# Patient Record
Sex: Male | Born: 1965 | Race: Black or African American | Hispanic: No | Marital: Married | Smoking: Former smoker
Health system: Southern US, Community
[De-identification: ages and names within clinical notes are randomized; demographics above are authoritative.]

## PROBLEM LIST (undated history)

## (undated) DIAGNOSIS — T7840XA Allergy, unspecified, initial encounter: Secondary | ICD-10-CM

## (undated) DIAGNOSIS — Z87442 Personal history of urinary calculi: Secondary | ICD-10-CM

## (undated) DIAGNOSIS — I10 Essential (primary) hypertension: Secondary | ICD-10-CM

## (undated) DIAGNOSIS — K219 Gastro-esophageal reflux disease without esophagitis: Secondary | ICD-10-CM

## (undated) DIAGNOSIS — M1990 Unspecified osteoarthritis, unspecified site: Secondary | ICD-10-CM

## (undated) DIAGNOSIS — T79A0XA Compartment syndrome, unspecified, initial encounter: Secondary | ICD-10-CM

## (undated) HISTORY — DX: Unspecified osteoarthritis, unspecified site: M19.90

## (undated) HISTORY — DX: Essential (primary) hypertension: I10

## (undated) HISTORY — DX: Allergy, unspecified, initial encounter: T78.40XA

## (undated) HISTORY — PX: COLONOSCOPY: SHX5424

## (undated) HISTORY — DX: Gastro-esophageal reflux disease without esophagitis: K21.9

## (undated) HISTORY — PX: FASCIOTOMY: SHX132

---

## 2003-01-13 ENCOUNTER — Emergency Department (HOSPITAL_COMMUNITY): Admission: AD | Admit: 2003-01-13 | Discharge: 2003-01-13 | Payer: Self-pay | Admitting: Family Medicine

## 2003-01-20 ENCOUNTER — Emergency Department (HOSPITAL_COMMUNITY): Admission: AD | Admit: 2003-01-20 | Discharge: 2003-01-20 | Payer: Self-pay | Admitting: Family Medicine

## 2003-01-20 ENCOUNTER — Encounter: Payer: Self-pay | Admitting: Family Medicine

## 2003-11-02 ENCOUNTER — Emergency Department (HOSPITAL_COMMUNITY): Admission: EM | Admit: 2003-11-02 | Discharge: 2003-11-02 | Payer: Self-pay

## 2003-12-26 ENCOUNTER — Emergency Department (HOSPITAL_COMMUNITY): Admission: EM | Admit: 2003-12-26 | Discharge: 2003-12-26 | Payer: Self-pay | Admitting: Emergency Medicine

## 2004-01-08 ENCOUNTER — Emergency Department (HOSPITAL_COMMUNITY): Admission: EM | Admit: 2004-01-08 | Discharge: 2004-01-08 | Payer: Self-pay | Admitting: Emergency Medicine

## 2004-08-12 ENCOUNTER — Emergency Department (HOSPITAL_COMMUNITY): Admission: EM | Admit: 2004-08-12 | Discharge: 2004-08-12 | Payer: Self-pay | Admitting: Family Medicine

## 2004-09-28 ENCOUNTER — Emergency Department (HOSPITAL_COMMUNITY): Admission: EM | Admit: 2004-09-28 | Discharge: 2004-09-28 | Payer: Self-pay | Admitting: Emergency Medicine

## 2012-03-28 HISTORY — PX: CARPAL TUNNEL RELEASE: SHX101

## 2013-09-29 ENCOUNTER — Encounter (HOSPITAL_BASED_OUTPATIENT_CLINIC_OR_DEPARTMENT_OTHER): Payer: Self-pay | Admitting: Emergency Medicine

## 2013-09-29 ENCOUNTER — Emergency Department (HOSPITAL_BASED_OUTPATIENT_CLINIC_OR_DEPARTMENT_OTHER)
Admission: EM | Admit: 2013-09-29 | Discharge: 2013-09-29 | Disposition: A | Discharge status: Home / Self Care | Payer: Self-pay | Attending: Emergency Medicine | Admitting: Emergency Medicine

## 2013-09-29 DIAGNOSIS — S60222A Contusion of left hand, initial encounter: Secondary | ICD-10-CM

## 2013-09-29 DIAGNOSIS — S60229A Contusion of unspecified hand, initial encounter: Secondary | ICD-10-CM | POA: Insufficient documentation

## 2013-09-29 DIAGNOSIS — Y9389 Activity, other specified: Secondary | ICD-10-CM | POA: Insufficient documentation

## 2013-09-29 DIAGNOSIS — Y92410 Unspecified street and highway as the place of occurrence of the external cause: Secondary | ICD-10-CM | POA: Insufficient documentation

## 2013-09-29 DIAGNOSIS — F17200 Nicotine dependence, unspecified, uncomplicated: Secondary | ICD-10-CM | POA: Insufficient documentation

## 2013-09-29 HISTORY — DX: Compartment syndrome, unspecified, initial encounter: T79.A0XA
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
## 2015-06-08 ENCOUNTER — Ambulatory Visit: Payer: Self-pay | Admitting: Family

## 2015-06-22 ENCOUNTER — Ambulatory Visit (INDEPENDENT_AMBULATORY_CARE_PROVIDER_SITE_OTHER): Payer: 59 | Admitting: Physician Assistant

## 2015-06-22 VITALS — BP 120/72 | HR 80 | Temp 100.0°F | Resp 18 | Ht 69.0 in | Wt 203.0 lb

## 2015-06-22 DIAGNOSIS — R509 Fever, unspecified: Secondary | ICD-10-CM

## 2015-06-22 DIAGNOSIS — R6889 Other general symptoms and signs: Secondary | ICD-10-CM

## 2015-06-22 LAB — POCT CBC
Granulocyte percent: 69.8 %G (ref 37–80)
HCT, POC: 39.8 % — AB (ref 43.5–53.7)
Hemoglobin: 13.9 g/dL — AB (ref 14.1–18.1)
Lymph, poc: 1.1 (ref 0.6–3.4)
MCH, POC: 28.7 pg (ref 27–31.2)
MCHC: 35 g/dL (ref 31.8–35.4)
MCV: 81.9 fL (ref 80–97)
MID (cbc): 0.3 (ref 0–0.9)
MPV: 7.3 fL (ref 0–99.8)
POC Granulocyte: 3.1 (ref 2–6.9)
POC LYMPH PERCENT: 24 %L (ref 10–50)
POC MID %: 6.2 %M (ref 0–12)
Platelet Count, POC: 172 10*3/uL (ref 142–424)
RBC: 4.85 M/uL (ref 4.69–6.13)
RDW, POC: 14.7 %
WBC: 4.5 10*3/uL — AB (ref 4.6–10.2)

## 2015-06-22 MED ORDER — HYDROCOD POLST-CPM POLST ER 10-8 MG/5ML PO SUER: 5.0000 mL | Freq: Two times a day (BID) | ORAL | Status: DC | PRN

## 2015-06-22 MED ORDER — BENZONATATE 100 MG PO CAPS: 100.0000 mg | ORAL_CAPSULE | Freq: Three times a day (TID) | ORAL | Status: DC | PRN

## 2015-06-22 NOTE — Patient Instructions (Addendum)
Drink plenty of water (64 oz/day) and get plenty of rest. If you have been prescribed a cough syrup, do not drive or operate heavy machinery while using this medication. Tessalon during the day. If your symptoms are not improving in 1 week, return to clinic.     IF you received an x-ray today, you will receive an invoice from [facility]. Please contact [facility] at [phone] with questions or concerns regarding your invoice.   IF you received labwork today, you will receive an invoice from [employer]. Please contact [facility] at [phone] with questions or concerns regarding your invoice.   Our billing staff will not be able to assist you with questions regarding bills from these companies.  You will be contacted with the lab results as soon as they are available. The fastest way to get your results is to activate your My Chart account. Instructions are located on the last page of this paperwork. If you have not heard from [place] regarding the results in 2 weeks, please contact this office.

## 2015-06-22 NOTE — Progress Notes (Signed)
[facility] [address] [place] [address] [phone]  Date:  06/22/2015   Name:  [name]   DOB:  20-Jan-1966   MRN:  [id number]  PCP:  No primary care provider on file.    Chief Complaint: Cough; Nasal Congestion; and Generalized Body Aches   History of Present Illness:  This is a 50 y.o. male with no PMH who is presenting with cough, nasal congestion and body aches x 5-6 days. Hasn't been checking temp at home but temp 100.0 here today. States his whole body aches when he coughs. Cough is dry. Last night thought he could hear some wheezing in his chest. Denies otalgia, sore throat.  Aggravating/alleviating factors: mucinex but doesn't think it's helping. History of asthma: no History of env allergies: does have problems with sinuses sometimes Tobacco use: no Did not get flu shot this season  He is here with wife. Her flu test was positive.  Review of Systems:  Review of Systems See HPI  There are no active problems to display for this patient.   Prior to Admission medications   Not on File    Allergies  Allergen Reactions  . Doxycycline     Past Surgical History  Procedure Laterality Date  . Carpal tunnel release      Social History  Substance Use Topics  . Smoking status: Former Research scientist (life sciences)  . Smokeless tobacco: None  . Alcohol Use: 0.0 oz/week    0 Standard drinks or equivalent per week    History reviewed. No pertinent family history.  Medication list has been reviewed and updated.  Physical Examination:  Physical Exam  Constitutional: He is oriented to person, place, and time. He appears well-developed and well-nourished. No distress.  HENT:  Head: Normocephalic and atraumatic.  Right Ear: Hearing, tympanic membrane, external ear and ear canal normal.  Left Ear: Hearing, tympanic membrane, external ear and ear canal normal.  Nose: Nose normal.  Mouth/Throat: Uvula is midline and mucous membranes are normal.  Posterior oropharyngeal erythema present. No oropharyngeal exudate or posterior oropharyngeal edema.  Eyes: Conjunctivae and lids are normal. Right eye exhibits no discharge. Left eye exhibits no discharge. No scleral icterus.  Cardiovascular: Normal rate, regular rhythm, normal heart sounds and normal pulses.   No murmur heard. Pulmonary/Chest: Effort normal and breath sounds normal. No respiratory distress. He has no wheezes. He has no rhonchi. He has no rales.  Musculoskeletal: Normal range of motion.  Lymphadenopathy:       Head (right side): No submental, no submandibular and no tonsillar adenopathy present.       Head (left side): No submental, no submandibular and no tonsillar adenopathy present.    He has no cervical adenopathy.  Neurological: He is alert and oriented to person, place, and time.  Skin: Skin is warm, dry and intact. No lesion and no rash noted.  Psychiatric: He has a normal mood and affect. His speech is normal and behavior is normal. Thought content normal.   BP 120/72 mmHg  Pulse 80  Temp(Src) 100 F (37.8 C) (Oral)  Resp 18  Ht 5\' 9"  (1.753 m)  Wt 203 lb (92.08 kg)  BMI 29.96 kg/m2  SpO2 98%  Results for orders placed or performed in visit on 06/22/15  POCT CBC  Result Value Ref Range   WBC 4.5 (A) 4.6 - 10.2 K/uL   Lymph, poc 1.1 0.6 - 3.4   POC LYMPH PERCENT 24.0 10 - 50 %L  MID (cbc) 0.3 0 - 0.9   POC MID % 6.2 0 - 12 %M   POC Granulocyte 3.1 2 - 6.9   Granulocyte percent 69.8 37 - 80 %G   RBC 4.85 4.69 - 6.13 M/uL   Hemoglobin 13.9 (A) 14.1 - 18.1 g/dL   HCT, POC 39.8 (A) 43.5 - 53.7 %   MCV 81.9 80 - 97 fL   MCH, POC 28.7 27 - 31.2 pg   MCHC 35.0 31.8 - 35.4 g/dL   RDW, POC 14.7 %   Platelet Count, POC 172 142 - 424 K/uL   MPV 7.3 0 - 99.8 fL   Assessment and Plan:  1. Flu-like symptoms 2. Fever, unspecified Pt likely at end of flu. His wife started with similar symptoms 2 days ago and tested positive for flu. CBC supports viral cause.  Will treat symptomatically with tussionex and tessalon. Return in 5-7 days if symptoms do not improve or at any time if symptoms worsen.  - benzonatate (TESSALON) 100 MG capsule; Take 1-2 capsules (100-200 mg total) by mouth 3 (three) times daily as needed for cough.  Dispense: 40 capsule; Refill: 0 - chlorpheniramine-HYDROcodone (TUSSIONEX PENNKINETIC ER) 10-8 MG/5ML SUER; Take 5 mLs by mouth every 12 (twelve) hours as needed for cough.  Dispense: 100 mL; Refill: 0 - POCT CBC   [name]  06/22/2015

## 2015-09-30 ENCOUNTER — Ambulatory Visit: Payer: Self-pay | Admitting: Family

## 2015-10-06 ENCOUNTER — Ambulatory Visit: Payer: Self-pay | Admitting: Family

## 2015-10-09 ENCOUNTER — Ambulatory Visit: Payer: Self-pay | Admitting: Family

## 2015-11-17 ENCOUNTER — Other Ambulatory Visit (INDEPENDENT_AMBULATORY_CARE_PROVIDER_SITE_OTHER): Payer: 59

## 2015-11-17 ENCOUNTER — Ambulatory Visit (INDEPENDENT_AMBULATORY_CARE_PROVIDER_SITE_OTHER): Payer: 59 | Admitting: Family

## 2015-11-17 ENCOUNTER — Encounter: Payer: Self-pay | Admitting: Family

## 2015-11-17 VITALS — BP 140/94 | HR 73 | Temp 98.3°F | Resp 16 | Ht 69.0 in | Wt 200.1 lb

## 2015-11-17 DIAGNOSIS — Z0000 Encounter for general adult medical examination without abnormal findings: Secondary | ICD-10-CM | POA: Insufficient documentation

## 2015-11-17 DIAGNOSIS — E663 Overweight: Secondary | ICD-10-CM

## 2015-11-17 DIAGNOSIS — Z1211 Encounter for screening for malignant neoplasm of colon: Secondary | ICD-10-CM | POA: Diagnosis not present

## 2015-11-17 DIAGNOSIS — Z23 Encounter for immunization: Secondary | ICD-10-CM | POA: Diagnosis not present

## 2015-11-17 LAB — CBC
HCT: 41.7 % (ref 39.0–52.0)
HEMOGLOBIN: 14.1 g/dL (ref 13.0–17.0)
MCHC: 33.8 g/dL (ref 30.0–36.0)
MCV: 81.8 fl (ref 78.0–100.0)
PLATELETS: 266 10*3/uL (ref 150.0–400.0)
RBC: 5.1 Mil/uL (ref 4.22–5.81)
RDW: 14.7 % (ref 11.5–15.5)
WBC: 4.5 10*3/uL (ref 4.0–10.5)

## 2015-11-17 LAB — COMPREHENSIVE METABOLIC PANEL
ALK PHOS: 64 U/L (ref 39–117)
ALT: 18 U/L (ref 0–53)
AST: 24 U/L (ref 0–37)
Albumin: 4.8 g/dL (ref 3.5–5.2)
BILIRUBIN TOTAL: 0.4 mg/dL (ref 0.2–1.2)
BUN: 11 mg/dL (ref 6–23)
CALCIUM: 9.5 mg/dL (ref 8.4–10.5)
CO2: 29 meq/L (ref 19–32)
CREATININE: 0.9 mg/dL (ref 0.40–1.50)
Chloride: 101 mEq/L (ref 96–112)
GFR: 113.85 mL/min (ref >60.00)
GLUCOSE: 104 mg/dL — AB (ref 70–99)
Potassium: 5.1 mEq/L (ref 3.5–5.1)
Sodium: 138 mEq/L (ref 135–145)
TOTAL PROTEIN: 7.9 g/dL (ref 6.0–8.3)

## 2015-11-17 LAB — LIPID PANEL
CHOL/HDL RATIO: 4
Cholesterol: 270 mg/dL — ABNORMAL HIGH (ref 0–200)
HDL: 76.7 mg/dL (ref >39.00)
LDL Cholesterol: 180 mg/dL — ABNORMAL HIGH (ref 0–99)
NONHDL: 193.11
Triglycerides: 68 mg/dL (ref 0.0–149.0)
VLDL: 13.6 mg/dL (ref 0.0–40.0)

## 2015-11-17 LAB — HEPATITIS C ANTIBODY: HCV AB: NEGATIVE

## 2015-11-17 LAB — PSA: PSA: 2.58 ng/mL (ref 0.10–4.00)

## 2015-11-17 NOTE — Assessment & Plan Note (Signed)
BMI of 29 indicating overweight. Recommend weight loss of 5-10% of current body weight. Recommend increasing physical activity to 30 minutes of moderate level activity daily. Encourage nutritional intake that focuses on nutrient dense foods and is moderate, varied, and balanced and is low in saturated fats and processed/sugary foods.

## 2015-11-17 NOTE — Assessment & Plan Note (Addendum)
1) Anticipatory Guidance: Discussed importance of wearing a seatbelt while driving and not texting while driving; changing batteries in smoke detector at least once annually; wearing suntan lotion when outside; eating a balanced and moderate diet; getting physical activity at least 30 minutes per day.  2) Immunizations / Screenings / Labs:  Tetanus updated today. Declines influenza. All other immunizations are up-to-date per recommendations. Due for a dental screening encouraged to be completed independently. Obtain PSA for prostate cancer screening. Obtain hepatitis C antibody for hepatitis C screening. Due for colonoscopy with referral to gastroenterology placed. All other screenings are up-to-date per recommendations. Obtain CBC, CMET, and lipid profile.  Overall well exam with risk factors for cardiovascular disease including overweight and elevated blood pressure without hypertension. Recommend weight loss of 5-10% of current body weight through nutrition and physical activity. Follow low-sodium diet.Recommend increasing physical activity to 30 minutes of moderate level activity daily. Encourage nutritional intake that focuses on nutrient dense foods and is moderate, varied, and balanced and is low in saturated fats and processed/sugary foods. Continue to monitor blood pressure at home. Continue other healthy lifestyle behaviors and choices. Follow-up prevention exam in 1 year. Follow up office visit pending blood work as necessary.

## 2015-11-17 NOTE — Progress Notes (Signed)
Subjective:    Patient ID: [name], male    DOB: 27-Dec-1965, 50 y.o.   MRN: [id number]  Chief Complaint  Patient presents with  . Establish Care    CPE, fasting    HPI:  [name] is a 50 y.o. male who presents today for an annual wellness visit.   1) Health Maintenance -   Diet - Averages about 2-3 meals per day consisting of regular diet; 1-2 cups of caffeine daily  Exercise - Work; no structured outside of work   2) Publishing rights manager / Immunizations:  Dental -- Due for exam  Vision -- Up to date   Health Maintenance  Topic Date Due  . Hepatitis C Screening  [birth year]  . HIV Screening  08/15/1978  . TETANUS/TDAP  08/15/1982  . COLONOSCOPY  08/14/2013  . INFLUENZA VACCINE  12/10/2016 (Originally 10/27/2015)   Immunization History  Administered Date(s) Administered  . Tdap 11/17/2015    Allergies  Allergen Reactions  . Doxycycline      Outpatient Medications Prior to Visit  Medication Sig Dispense Refill  . benzonatate (TESSALON) 100 MG capsule Take 1-2 capsules (100-200 mg total) by mouth 3 (three) times daily as needed for cough. 40 capsule 0  . chlorpheniramine-HYDROcodone (TUSSIONEX PENNKINETIC ER) 10-8 MG/5ML SUER Take 5 mLs by mouth every 12 (twelve) hours as needed for cough. 100 mL 0   No facility-administered medications prior to visit.      Past Medical History:  Diagnosis Date  . 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 CARPAL TUNNEL RELEASE       Family History  Problem Relation Age of Onset  . Cataracts Mother   . Dementia Mother   . Hypertension Father   . Kidney disease Father   . Glaucoma Maternal Grandmother   . Hypertension Paternal Grandfather      Social History   Social History  . Marital status: Married    Spouse name: N/A  . Number of children: 2  . Years of education: 12   Occupational History  . [employer]    Social  History Main Topics  . Smoking status: Former Research scientist (life sciences)  . Smokeless tobacco: Never Used  . Alcohol use 1.2 oz/week    2 Cans of beer per week  . Drug use: No  . Sexual activity: Not on file   Other Topics Concern  . Not on file   Social History Narrative   Fun: [name]    Review of Systems  Constitutional: Denies fever, chills, fatigue, or significant weight gain/loss. HENT: Head: Denies headache or neck pain Ears: Denies changes in hearing, ringing in ears, earache, drainage Nose: Denies discharge, stuffiness, itching, nosebleed, sinus pain Throat: Denies sore throat, hoarseness, dry mouth, sores, thrush Eyes: Denies loss/changes in vision, pain, redness, blurry/double vision, flashing lights Cardiovascular: Denies chest pain/discomfort, tightness, palpitations, shortness of breath with activity, difficulty lying down, swelling, sudden awakening with shortness of breath Respiratory: Denies shortness of breath, cough, sputum production, wheezing Gastrointestinal: Denies dysphasia, heartburn, change in appetite, nausea, change in bowel habits, rectal bleeding, constipation, diarrhea, yellow skin or eyes Genitourinary: Denies frequency, urgency, burning/pain, blood in urine, incontinence, change in urinary strength. Musculoskeletal: Denies muscle/joint pain, stiffness, back pain, redness or swelling of joints, trauma Skin: Denies rashes, lumps, itching, dryness, color changes, or hair/nail changes Neurological: Denies dizziness, fainting, seizures, weakness, numbness, tingling, tremor Psychiatric - Denies nervousness, stress,  depression or memory loss Endocrine: Denies heat or cold intolerance, sweating, frequent urination, excessive thirst, changes in appetite Hematologic: Denies ease of bruising or bleeding     Objective:    BP (!) 140/94 (BP Location: Left Arm, Patient Position: Sitting, Cuff Size: Large)   Pulse 73   Temp 98.3 F (36.8 C) (Oral)   Resp 16   Ht 5\' 9"  (1.753 m)    Wt 200 lb 1.9 oz (90.8 kg)   SpO2 96%   BMI 29.55 kg/m  Nursing note and vital signs reviewed.  Physical Exam  Constitutional: He is oriented to person, place, and time. He appears well-developed and well-nourished.  HENT:  Head: Normocephalic.  Right Ear: Hearing, tympanic membrane, external ear and ear canal normal.  Left Ear: Hearing, tympanic membrane, external ear and ear canal normal.  Nose: Nose normal.  Mouth/Throat: Uvula is midline, oropharynx is clear and moist and mucous membranes are normal.  Eyes: Conjunctivae and EOM are normal. Pupils are equal, round, and reactive to light.  Neck: Neck supple. No JVD present. No tracheal deviation present. No thyromegaly present.  Cardiovascular: Normal rate, regular rhythm, normal heart sounds and intact distal pulses.   Pulmonary/Chest: Effort normal and breath sounds normal.  Abdominal: Soft. Bowel sounds are normal. He exhibits no distension and no mass. There is no tenderness. There is no rebound and no guarding.  Musculoskeletal: Normal range of motion. He exhibits no edema or tenderness.  Lymphadenopathy:    He has no cervical adenopathy.  Neurological: He is alert and oriented to person, place, and time. He has normal reflexes. No cranial nerve deficit. He exhibits normal muscle tone. Coordination normal.  Skin: Skin is warm and dry.  Psychiatric: He has a normal mood and affect. His behavior is normal. Judgment and thought content normal.       Assessment & Plan:   Problem List Items Addressed This Visit      Other   Routine general medical examination at a health care facility - Primary    1) Anticipatory Guidance: Discussed importance of wearing a seatbelt while driving and not texting while driving; changing batteries in smoke detector at least once annually; wearing suntan lotion when outside; eating a balanced and moderate diet; getting physical activity at least 30 minutes per day.  2) Immunizations / Screenings  / Labs:  Tetanus updated today. Declines influenza. All other immunizations are up-to-date per recommendations. Due for a dental screening encouraged to be completed independently. Obtain PSA for prostate cancer screening. Obtain hepatitis C antibody for hepatitis C screening. Due for colonoscopy with referral to gastroenterology placed. All other screenings are up-to-date per recommendations. Obtain CBC, CMET, and lipid profile.  Overall well exam with risk factors for cardiovascular disease including overweight and elevated blood pressure without hypertension. Recommend weight loss of 5-10% of current body weight through nutrition and physical activity. Follow low-sodium diet.Recommend increasing physical activity to 30 minutes of moderate level activity daily. Encourage nutritional intake that focuses on nutrient dense foods and is moderate, varied, and balanced and is low in saturated fats and processed/sugary foods. Continue to monitor blood pressure at home. Continue other healthy lifestyle behaviors and choices. Follow-up prevention exam in 1 year. Follow up office visit pending blood work as necessary.         Relevant Orders   CBC (Completed)   Comprehensive metabolic panel (Completed)   Lipid panel (Completed)   PSA (Completed)   Hepatitis C antibody   Overweight (BMI 25.0-29.9)  BMI of 29 indicating overweight. Recommend weight loss of 5-10% of current body weight. Recommend increasing physical activity to 30 minutes of moderate level activity daily. Encourage nutritional intake that focuses on nutrient dense foods and is moderate, varied, and balanced and is low in saturated fats and processed/sugary foods.        Other Visit Diagnoses    Colon cancer screening       Relevant Orders   Ambulatory referral to Gastroenterology   Need for Tdap vaccination       Relevant Orders   Tdap vaccine greater than or equal to 7yo IM (Completed)      I have discontinued Mr. Cuadras  benzonatate and chlorpheniramine-HYDROcodone.   Follow-up: Return if symptoms worsen or fail to improve.   Mauricio Po, FNP

## 2015-11-17 NOTE — Patient Instructions (Signed)
Thank you for choosing [facility].  Summary/Instructions:  Your prescription(s) have been submitted to your pharmacy or been printed and provided for you. Please take as directed and contact our office if you believe you are having problem(s) with the medication(s) or have any questions.  Please stop by the lab on the lower level of the building for your blood work. Your results will be released to [place] (or called to you) after review, usually within 72 hours after test completion. If any changes need to be made, you will be notified at that same time.  1. The lab is open from 7:30am to 5:30 pm Monday-Friday  2. No appointment is necessary  3. Fasting (if needed) is 6-8 hours after food and drink; black  coffee and water are okay   Please stop by radiology on the basement level of the building for your x-rays. Your results will be released to [place] (or called to you) after review, usually within 72 hours after test completion. If any treatments or changes are necessary, you will be notified at that same time.  Referrals have been made during this visit. You should expect to hear back from our schedulers in about 7-10 days in regards to establishing an appointment with the specialists we discussed.   If your symptoms worsen or fail to improve, please contact our office for further instruction, or in case of emergency go directly to the emergency room at the closest medical facility.   Health Maintenance, Male A healthy lifestyle and preventative care can promote health and wellness.  Maintain regular health, dental, and eye exams.  Eat a healthy diet. Foods like vegetables, fruits, whole grains, low-fat dairy products, and lean protein foods contain the nutrients you need and are low in calories. Decrease your intake of foods high in solid fats, added sugars, and salt. Get information about a proper diet from your health care provider, if necessary.  Regular physical exercise is  one of the most important things you can do for your health. Most adults should get at least 150 minutes of moderate-intensity exercise (any activity that increases your heart rate and causes you to sweat) each week. In addition, most adults need muscle-strengthening exercises on 2 or more days a week.   Maintain a healthy weight. The body mass index (BMI) is a screening tool to identify possible weight problems. It provides an estimate of body fat based on height and weight. Your health care provider can find your BMI and can help you achieve or maintain a healthy weight. For males 20 years and older:  A BMI below 18.5 is considered underweight.  A BMI of 18.5 to 24.9 is normal.  A BMI of 25 to 29.9 is considered overweight.  A BMI of 30 and above is considered obese.  Maintain normal blood lipids and cholesterol by exercising and minimizing your intake of saturated fat. Eat a balanced diet with plenty of fruits and vegetables. Blood tests for lipids and cholesterol should begin at age 49 and be repeated every 5 years. If your lipid or cholesterol levels are high, you are over age 26, or you are at high risk for heart disease, you may need your cholesterol levels checked more frequently.Ongoing high lipid and cholesterol levels should be treated with medicines if diet and exercise are not working.  If you smoke, find out from your health care provider how to quit. If you do not use tobacco, do not start.  Lung cancer screening is recommended for  adults aged 1-80 years who are at high risk for developing lung cancer because of a history of smoking. A yearly low-dose CT scan of the lungs is recommended for people who have at least a 30-pack-year history of smoking and are current smokers or have quit within the past 15 years. A pack year of smoking is smoking an average of 1 pack of cigarettes a day for 1 year (for example, a 30-pack-year history of smoking could mean smoking 1 pack a day for 30  years or 2 packs a day for 15 years). Yearly screening should continue until the smoker has stopped smoking for at least 15 years. Yearly screening should be stopped for people who develop a health problem that would prevent them from having lung cancer treatment.  If you choose to drink alcohol, do not have more than 2 drinks per day. One drink is considered to be 12 oz (360 mL) of beer, 5 oz (150 mL) of wine, or 1.5 oz (45 mL) of liquor.  Avoid the use of street drugs. Do not share needles with anyone. Ask for help if you need support or instructions about stopping the use of drugs.  High blood pressure causes heart disease and increases the risk of stroke. High blood pressure is more likely to develop in:  People who have blood pressure in the end of the normal range (100-139/85-89 mm Hg).  People who are overweight or obese.  People who are African American.  If you are 79-68 years of age, have your blood pressure checked every 3-5 years. If you are 50 years of age or older, have your blood pressure checked every year. You should have your blood pressure measured twice--once when you are at a hospital or clinic, and once when you are not at a hospital or clinic. Record the average of the two measurements. To check your blood pressure when you are not at a hospital or clinic, you can use:  An automated blood pressure machine at a pharmacy.  A home blood pressure monitor.  If you are 71-25 years old, ask your health care provider if you should take aspirin to prevent heart disease.  Diabetes screening involves taking a blood sample to check your fasting blood sugar level. This should be done once every 3 years after age 3 if you are at a normal weight and without risk factors for diabetes. Testing should be considered at a younger age or be carried out more frequently if you are overweight and have at least 1 risk factor for diabetes.  Colorectal cancer can be detected and often prevented.  Most routine colorectal cancer screening begins at the age of 42 and continues through age 39. However, your health care provider may recommend screening at an earlier age if you have risk factors for colon cancer. On a yearly basis, your health care provider may provide home test kits to check for hidden blood in the stool. A small camera at the end of a tube may be used to directly examine the colon (sigmoidoscopy or colonoscopy) to detect the earliest forms of colorectal cancer. Talk to your health care provider about this at age 3 when routine screening begins. A direct exam of the colon should be repeated every 5-10 years through age 17, unless early forms of precancerous polyps or small growths are found.  People who are at an increased risk for hepatitis B should be screened for this virus. You are considered at high risk for hepatitis B  if:  You were born in a country where hepatitis B occurs often. Talk with your health care provider about which countries are considered high risk.  Your parents were born in a high-risk country and you have not received a shot to protect against hepatitis B (hepatitis B vaccine).  You have HIV or AIDS.  You use needles to inject street drugs.  You live with, or have sex with, someone who has hepatitis B.  You are a man who has sex with other men (MSM).  You get hemodialysis treatment.  You take certain medicines for conditions like cancer, organ transplantation, and autoimmune conditions.  Hepatitis C blood testing is recommended for all people born from [birth year] through 1967 and any individual with known risk factors for hepatitis C.  Healthy men should no longer receive prostate-specific antigen (PSA) blood tests as part of routine cancer screening. Talk to your health care provider about prostate cancer screening.  Testicular cancer screening is not recommended for adolescents or adult males who have no symptoms. Screening includes self-exam, a health  care provider exam, and other screening tests. Consult with your health care provider about any symptoms you have or any concerns you have about testicular cancer.  Practice safe sex. Use condoms and avoid high-risk sexual practices to reduce the spread of sexually transmitted infections (STIs).  You should be screened for STIs, including gonorrhea and chlamydia if:  You are sexually active and are younger than 24 years.  You are older than 24 years, and your health care provider tells you that you are at risk for this type of infection.  Your sexual activity has changed since you were last screened, and you are at an increased risk for chlamydia or gonorrhea. Ask your health care provider if you are at risk.  If you are at risk of being infected with HIV, it is recommended that you take a prescription medicine daily to prevent HIV infection. This is called pre-exposure prophylaxis (PrEP). You are considered at risk if:  You are a man who has sex with other men (MSM).  You are a heterosexual man who is sexually active with multiple partners.  You take drugs by injection.  You are sexually active with a partner who has HIV.  Talk with your health care provider about whether you are at high risk of being infected with HIV. If you choose to begin PrEP, you should first be tested for HIV. You should then be tested every 3 months for as long as you are taking PrEP.  Use sunscreen. Apply sunscreen liberally and repeatedly throughout the day. You should seek shade when your shadow is shorter than you. Protect yourself by wearing long sleeves, pants, a wide-brimmed hat, and sunglasses year round whenever you are outdoors.  Tell your health care provider of new moles or changes in moles, especially if there is a change in shape or color. Also, tell your health care provider if a mole is larger than the size of a pencil eraser.  A one-time screening for abdominal aortic aneurysm (AAA) and surgical  repair of large AAAs by ultrasound is recommended for men aged 14-75 years who are current or former smokers.  Stay current with your vaccines (immunizations).   This information is not intended to replace advice given to you by your health care provider. Make sure you discuss any questions you have with your health care provider.   Document Released: 09/10/2007 Document Revised: 04/04/2014 Document Reviewed: 08/09/2010 Elsevier Interactive Patient Education  2016 [employer].

## 2016-01-01 ENCOUNTER — Encounter: Payer: Self-pay | Admitting: Family

## 2016-01-15 ENCOUNTER — Encounter: Payer: Self-pay | Admitting: Family

## 2016-01-15 ENCOUNTER — Ambulatory Visit (INDEPENDENT_AMBULATORY_CARE_PROVIDER_SITE_OTHER): Payer: 59 | Admitting: Family

## 2016-01-15 DIAGNOSIS — J069 Acute upper respiratory infection, unspecified: Secondary | ICD-10-CM | POA: Insufficient documentation

## 2016-01-15 DIAGNOSIS — L723 Sebaceous cyst: Secondary | ICD-10-CM | POA: Diagnosis not present

## 2016-01-15 MED ORDER — HYDROCOD POLST-CPM POLST ER 10-8 MG/5ML PO SUER: 5.0000 mL | Freq: Every evening | ORAL | 0 refills | Status: DC | PRN

## 2016-01-15 NOTE — Assessment & Plan Note (Signed)
Symptoms and exam consistent with acute upper respiratory infection most likely viral. Start Tussionex as needed for cough and sleep. Continue over-the-counter medications as needed for symptom relief and supportive care. Follow-up if symptoms worsen or do not improve.

## 2016-01-15 NOTE — Progress Notes (Signed)
Subjective:    Patient ID: [name], male    DOB: 1965-06-12, 50 y.o.   MRN: [id number]  Chief Complaint  Patient presents with  . Cyst    cyst on the right side of his head that has grown over time, also has a bad cough wants to see about getting some cough medicine x1 week    HPI:  [name] is a 50 y.o. male who  has a past medical history of Compartment syndrome ([place]). and presents today for an acute office visit.   1.) Cyst - This is a new problem. Associated symptom of a cyst located on the right side of his head has been going on for a couple of months. There is no pain or drainage. Since he initially noted it it has grown in size. Modifying factors include a warm cloth and wiping it with alcohol which did not help very much. Thinks it may have decreased in size a little.   2.) Cough - This is a new problem.  Associated symptom of a cough has been going on for about 1 week. Describes it as a cold. May have had a fever at 101 but unsure. No fevers for about 4 days. Currently experiencing some drainage. Modifying factors include Mucinex. Timing of the symptoms are generally worse at night with congestion and coughing.   Allergies  Allergen Reactions  . Doxycycline       No outpatient prescriptions prior to visit.   No facility-administered medications prior to visit.       Past Surgical History:  Procedure Laterality Date  . CARPAL TUNNEL RELEASE        Past Medical History:  Diagnosis Date  . 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  HENT: Positive for congestion. Negative for sore throat.   Respiratory: Positive for cough. Negative for chest tightness and shortness of breath.   Skin:       Positive for a cyst on his head.       Objective:    BP (!) 158/102 (BP Location: Left Arm, Patient Position: Sitting, Cuff Size: Normal)   Pulse 76   Temp 98.5 F (36.9 C)  (Oral)   Resp 16   Ht 5\' 9"  (1.753 m)   Wt 202 lb 6.4 oz (91.8 kg)   SpO2 97%   BMI 29.89 kg/m  Nursing note and vital signs reviewed.   Physical Exam  Constitutional: He is oriented to person, place, and time. He appears well-developed and well-nourished. No distress.  HENT:  Right Ear: Hearing, tympanic membrane, external ear and ear canal normal.  Left Ear: Hearing, tympanic membrane, external ear and ear canal normal.  Nose: Nose normal.  Mouth/Throat: Uvula is midline, oropharynx is clear and moist and mucous membranes are normal.  Cardiovascular: Normal rate, regular rhythm, normal heart sounds and intact distal pulses.   Pulmonary/Chest: Effort normal and breath sounds normal.  Neurological: He is alert and oriented to person, place, and time.  Skin: Skin is warm and dry.     Psychiatric: He has a normal mood and affect. His behavior is normal. Judgment and thought content normal.       Assessment & Plan:   Problem List Items Addressed This Visit      Respiratory   Acute upper respiratory infection    Symptoms and exam consistent with acute upper respiratory infection most likely  viral. Start Tussionex as needed for cough and sleep. Continue over-the-counter medications as needed for symptom relief and supportive care. Follow-up if symptoms worsen or do not improve.        Musculoskeletal and Integument   Sebaceous cyst    Symptoms and exam consistent with sebaceous cyst. Discussed treatment options including conservative warm compresses and incision and drainage. Patient wishes to continue with conservative treatment at this time if symptoms worsen or do not improve will schedule appointment for incision and drainage. Follow-up if symptoms worsen or fail to improve.       Other Visit Diagnoses   None.      I am having Mr. Weidel start on chlorpheniramine-HYDROcodone.   Meds ordered this encounter  Medications  . chlorpheniramine-HYDROcodone (TUSSIONEX  PENNKINETIC ER) 10-8 MG/5ML SUER    Sig: Take 5 mLs by mouth at bedtime as needed.    Dispense:  115 mL    Refill:  0    Order Specific Question:   Supervising Provider    Answer:   Pricilla Holm A [id number]     Follow-up: Return if symptoms worsen or fail to improve.  Mauricio Po, FNP

## 2016-01-15 NOTE — Patient Instructions (Signed)
Thank you for choosing [facility].  SUMMARY AND INSTRUCTIONS:  Medication:  Start the Tussinex as needed for cough.   Your prescription(s) have been submitted to your pharmacy or been printed and provided for you. Please take as directed and contact our office if you believe you are having problem(s) with the medication(s) or have any questions.  Follow up:  If your symptoms worsen or fail to improve, please contact our office for further instruction, or in case of emergency go directly to the emergency room at the closest medical facility.   General Recommendations:    Please drink plenty of fluids.  Get plenty of rest   Sleep in humidified air  Use saline nasal sprays  Netti pot   OTC Medications:  Decongestants - helps relieve congestion   Flonase (generic fluticasone) or Nasacort (generic triamcinolone) - please make sure to use the "cross-over" technique at a 45 degree angle towards the opposite eye as opposed to straight up the nasal passageway.   Sudafed (generic pseudoephedrine - Note this is the one that is available behind the pharmacy counter); Products with phenylephrine (-PE) may also be used but is often not as effective as pseudoephedrine.   If you have HIGH BLOOD PRESSURE - Coricidin HBP; AVOID any product that is -D as this contains pseudoephedrine which may increase your blood pressure.  Afrin (oxymetazoline) every 6-8 hours for up to 3 days.   Allergies - helps relieve runny nose, itchy eyes and sneezing   Claritin (generic loratidine), Allegra (fexofenidine), or Zyrtec (generic cyrterizine) for runny nose. These medications should not cause drowsiness.  Note - Benadryl (generic diphenhydramine) may be used however may cause drowsiness  Cough -   Delsym or Robitussin (generic dextromethorphan)  Expectorants - helps loosen mucus to ease removal   Mucinex (generic guaifenesin) as directed on the package.  Headaches / General  Aches   Tylenol (generic acetaminophen) - DO NOT EXCEED 3 grams (3,000 mg) in a 24 hour time period  Advil/Motrin (generic ibuprofen)   Sore Throat -   Salt water gargle   Chloraseptic (generic benzocaine) spray or lozenges / Sucrets (generic dyclonine)      Sebaceous Cyst Removal Sebaceous cyst removal is a procedure to remove a sac of oily material that forms under your skin (sebaceous cyst). Sebaceous cysts may also be called epidermoid cysts or keratin cysts. Normally, the skin secretes this oily material through a gland or a hair follicle. This type of cyst usually results when a skin gland or hair follicle becomes blocked. You may need this procedure if you have a sebaceous cyst that becomes large, uncomfortable, or infected. LET [facility] CARE PROVIDER KNOW ABOUT:  Any allergies you have.  All medicines you are taking, including vitamins, herbs, eye drops, creams, and over-the-counter medicines.  Previous problems you or members of your family have had with the use of anesthetics.  Any blood disorders you have.  Previous surgeries you have had.  Medical conditions you have. RISKS AND COMPLICATIONS Generally, this is a safe procedure. However, problems may occur, including:  Developing another cyst.  Bleeding.  Infection.  Scarring. BEFORE THE PROCEDURE  Ask your health care provider about:  Changing or stopping your regular medicines. This is especially important if you are taking diabetes medicines or blood thinners.  Taking medicines such as aspirin and ibuprofen. These medicines can thin your blood. Do not take these medicines before your procedure if your health care provider instructs you not to.  If you  have an infected cyst, you may have to take antibiotic medicines before or after the cyst removal. Take your antibiotics as directed by your health care provider. Finish all of the medicine even if you start to feel better.  Take a shower on the  morning of your procedure. Your health care provider may ask you to use a germ-killing (antiseptic) soap. PROCEDURE  You will be given a medicine that numbs the area (local anesthetic).  The skin around the cyst will be cleaned with a germ-killing solution (antiseptic).  Your health care provider will make a small surgical incision over the cyst.  The cyst will be separated from the surrounding tissues that are under your skin.  If possible, the cyst will be removed undamaged (intact).  If the cyst bursts (ruptures), it will need to be removed in pieces.  After the cyst is removed, your health care provider will control any bleeding and close the incision with small stitches (sutures). Small incisions may not need sutures, and the bleeding will be controlled by applying direct pressure with gauze.  Your health care provider may apply antibiotic ointment and a light bandage (dressing) over the incision. This procedure may vary among health care providers and hospitals. AFTER THE PROCEDURE  If your cyst ruptured during surgery, you may need to take antibiotic medicine. If you were prescribed an antibiotic medicine, finish all of it even if you start to feel better.   This information is not intended to replace advice given to you by your health care provider. Make sure you discuss any questions you have with your health care provider.   Document Released: 03/11/2000 Document Revised: 04/04/2014 Document Reviewed: 11/27/2013 Elsevier Interactive Patient Education [employer].

## 2016-01-15 NOTE — Assessment & Plan Note (Signed)
Symptoms and exam consistent with sebaceous cyst. Discussed treatment options including conservative warm compresses and incision and drainage. Patient wishes to continue with conservative treatment at this time if symptoms worsen or do not improve will schedule appointment for incision and drainage. Follow-up if symptoms worsen or fail to improve.

## 2016-02-12 ENCOUNTER — Ambulatory Visit: Payer: 59 | Admitting: Family

## 2016-04-29 ENCOUNTER — Encounter: Payer: Self-pay | Admitting: Family

## 2016-04-29 ENCOUNTER — Ambulatory Visit (INDEPENDENT_AMBULATORY_CARE_PROVIDER_SITE_OTHER): Payer: 59 | Admitting: Family

## 2016-04-29 ENCOUNTER — Telehealth: Payer: Self-pay

## 2016-04-29 VITALS — BP 128/72 | HR 83 | Temp 98.5°F | Resp 16 | Ht 69.0 in | Wt 206.0 lb

## 2016-04-29 DIAGNOSIS — Z1211 Encounter for screening for malignant neoplasm of colon: Secondary | ICD-10-CM | POA: Diagnosis not present

## 2016-04-29 DIAGNOSIS — R5383 Other fatigue: Secondary | ICD-10-CM | POA: Diagnosis not present

## 2016-04-29 DIAGNOSIS — G4710 Hypersomnia, unspecified: Secondary | ICD-10-CM

## 2016-04-29 DIAGNOSIS — N521 Erectile dysfunction due to diseases classified elsewhere: Secondary | ICD-10-CM | POA: Insufficient documentation

## 2016-04-29 MED ORDER — SILDENAFIL CITRATE 20 MG PO TABS: 20.0000 mg | ORAL_TABLET | Freq: Every day | ORAL | 0 refills | Status: DC | PRN

## 2016-04-29 NOTE — Telephone Encounter (Signed)
Medication sent.

## 2016-04-29 NOTE — Progress Notes (Signed)
Subjective:    Patient ID: [name], male    DOB: 1965-11-04, 51 y.o.   MRN: [id number]  Chief Complaint  Patient presents with  . CPE    not fasting    HPI:  [name] is a 51 y.o. 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 and presents today for an office visit.   1.) Fatigue - This is a new problem. Associated symptom of fatigue described as feeling like having decreased energy has been going on for about 2 months. Severity of the symptoms has him being able to go to sleep and feeling tired when he gets home from work. Sleeping on average about 4 hours per night. Has been told that he snores at night and has been told he stops breathing at night. Does not feel well rested when he wakes up in the morning. Denies any cold intolerance or changes to skin/hair/nails. No chest pain, shortness of breath, or heart palpitations. Endorses occasional PND.   2.) Erectile dysfunction - this is a new problem. Associated symptom of inability to maintain an erection has been going on for approximately 2 months. Denies any trauma or injury. Notes that when he is trying to be intermittent he is able to obtain an erection however strength of his erection is decreased as well as the ability to maintain it. There are no modifying factors that make it better or worse. Severity is enough to affect his relationship with his wife.   Allergies  Allergen Reactions  . Doxycycline       Outpatient Medications Prior to Visit  Medication Sig Dispense Refill  . chlorpheniramine-HYDROcodone (TUSSIONEX PENNKINETIC ER) 10-8 MG/5ML SUER Take 5 mLs by mouth at bedtime as needed. 115 mL 0   No facility-administered medications prior to visit.       Past Surgical History:  Procedure Laterality Date  . CARPAL TUNNEL RELEASE        Past Medical History:  Diagnosis Date  . 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 Negative for chills and fever.  Respiratory: Negative for chest tightness and shortness of breath.   Cardiovascular: Negative for chest pain, palpitations and leg swelling.  Neurological: Negative for dizziness, weakness and light-headedness.  Psychiatric/Behavioral: Positive for sleep disturbance. Negative for dysphoric mood, hallucinations and suicidal ideas. The patient is not nervous/anxious and is not hyperactive.       Objective:    BP 128/72 (BP Location: Left Arm, Patient Position: Sitting, Cuff Size: Large)   Pulse 83   Temp 98.5 F (36.9 C) (Oral)   Resp 16   Ht 5\' 9"  (1.753 m)   Wt 206 lb (93.4 kg)   SpO2 97%   BMI 30.42 kg/m  Nursing note and vital signs reviewed.  Physical Exam  Constitutional: He is oriented to person, place, and time. He appears well-developed and well-nourished. No distress.  Cardiovascular: Normal rate, regular rhythm, normal heart sounds and intact distal pulses.   Pulmonary/Chest: Effort normal and breath sounds normal.  Neurological: He is alert and oriented to person, place, and time.  Skin: Skin is warm and dry.  Psychiatric: He has a normal mood and affect. His behavior is normal. Judgment and thought content normal.   Depression screen [facility] 2/9 04/29/2016  Decreased Interest 2  Down, Depressed, Hopeless 1  PHQ - 2 Score 3  Altered sleeping 0  Tired, decreased  energy 2  Change in appetite 1  Feeling bad or failure about yourself  1  Trouble concentrating 0  Moving slowly or fidgety/restless 0  Suicidal thoughts 0  PHQ-9 Score 7   Epworth Sleepiness Scale - 12     Assessment & Plan:   Problem List Items Addressed This Visit      Other   Fatigue - Primary    This is a new problem. Symptoms of fatigue concerning for sleep apnea given hypersomnolence, snoring and periods of observed apnea. EPSS of 12. Other considerations include metabolic, psychological, or cardiovascular origin. Obtain TSH, testosterone, IBC panel,  hemoglobin A1c, CBC, and B 12/folate. Refer to sleep medicine for possible sleep apnea testing. Follow-up pending blood work.      Relevant Orders   B12 and Folate Panel   CBC   Hemoglobin A1c   IBC panel   TSH   Testosterone   Erectile dysfunction due to diseases classified elsewhere    Erectile dysfunction of secondary cause most likely multifactorial. Metabolic testing will be completed as listed under fatigue. Discussed options of treatment including medication and referral to urology. Patient wishes to research medication first. Information provided and after visit summary. Continue to monitor.       Other Visit Diagnoses    Colon cancer screening       Relevant Orders   Ambulatory referral to Gastroenterology      A total of 25  minutes were spent face-to-face with the patient during this encounter and over half of that time was spent on counseling and coordination of care.  We discussed in depth causes of fatigue, sleep apnea, and erectile dysfunction and treatments/medications for each of the conditions.    Follow-up: Return in about 3 weeks (around 05/20/2016), or if symptoms worsen or fail to improve.  Mauricio Po, FNP

## 2016-04-29 NOTE — Telephone Encounter (Signed)
Pt aware.

## 2016-04-29 NOTE — Patient Instructions (Addendum)
Thank you for choosing [facility].  SUMMARY AND INSTRUCTIONS:  Please complete blood work between 8-10 am.   Check on [facility] for Sildenafil program.  Viagra   Cialis  Revatio (sildenafil 20 mg)  Levitra    Labs:  Please stop by the lab on the lower level of the building for your blood work. Your results will be released to [place] (or called to you) after review, usually within 72 hours after test completion. If any changes need to be made, you will be notified at that same time.  1.) The lab is open from 7:30am to 5:30 pm Monday-Friday 2.) No appointment is necessary 3.) Fasting (if needed) is 6-8 hours after food and drink; black coffee and water are okay    Follow up:  If your symptoms worsen or fail to improve, please contact our office for further instruction, or in case of emergency go directly to the emergency room at the closest medical facility.     Fatigue Introduction Fatigue is feeling tired all of the time, a lack of energy, or a lack of motivation. Occasional or mild fatigue is often a normal response to activity or life in general. However, long-lasting (chronic) or extreme fatigue may indicate an underlying medical condition. Follow these instructions at home: Watch your fatigue for any changes. The following actions may help to lessen any discomfort you are feeling:  Talk to your health care provider about how much sleep you need each night. Try to get the required amount every night.  Take medicines only as directed by your health care provider.  Eat a healthy and nutritious diet. Ask your health care provider if you need help changing your diet.  Drink enough fluid to keep your urine clear or pale yellow.  Practice ways of relaxing, such as yoga, meditation, massage therapy, or acupuncture.  Exercise regularly.  Change situations that cause you stress. Try to keep your work and personal routine reasonable.  Do not abuse illegal  drugs.  Limit alcohol intake to no more than 1 drink per day for nonpregnant women and 2 drinks per day for men. One drink equals 12 ounces of beer, 5 ounces of wine, or 1 ounces of hard liquor.  Take a multivitamin, if directed by your health care provider. Contact a health care provider if:  Your fatigue does not get better.  You have a fever.  You have unintentional weight loss or gain.  You have headaches.  You have difficulty:  Falling asleep.  Sleeping throughout the night.  You feel angry, guilty, anxious, or sad.  You are unable to have a bowel movement (constipation).  You skin is dry.  Your legs or another part of your body is swollen. Get help right away if:  You feel confused.  Your vision is blurry.  You feel faint or pass out.  You have a severe headache.  You have severe abdominal, pelvic, or back pain.  You have chest pain, shortness of breath, or an irregular or fast heartbeat.  You are unable to urinate or you urinate less than normal.  You develop abnormal bleeding, such as bleeding from the rectum, vagina, nose, lungs, or nipples.  You vomit blood.  You have thoughts about harming yourself or committing suicide.  You are worried that you might harm someone else. This information is not intended to replace advice given to you by your health care provider. Make sure you discuss any questions you have with your health care provider.  Document Released: 01/09/2007 Document Revised: 08/20/2015 Document Reviewed: 07/16/2013  2017 Elsevier  Erectile Dysfunction Erectile dysfunction is the inability to get or sustain a good enough erection to have sexual intercourse. Erectile dysfunction may involve:  Inability to get an erection.  Lack of enough hardness to allow penetration.  Loss of the erection before sex is finished.  Premature ejaculation. CAUSES  Certain drugs, such as:  Pain  relievers.  Antihistamines.  Antidepressants.  Blood pressure medicines.  Water pills (diuretics).  Ulcer medicines.  Muscle relaxants.  Illegal drugs.  Excessive drinking.  Psychological causes, such as:  Anxiety.  Depression.  Sadness.  Exhaustion.  Performance fear.  Stress.  Physical causes, such as:  Artery problems. This may include diabetes, smoking, liver disease, or atherosclerosis.  High blood pressure.  Hormonal problems, such as low testosterone.  Obesity.  Nerve problems. This may include back or pelvic injuries, diabetes mellitus, multiple sclerosis, or Parkinson disease. SYMPTOMS  Inability to get an erection.  Lack of enough hardness to allow penetration.  Loss of the erection before sex is finished.  Premature ejaculation.  Normal erections at some times, but with frequent unsatisfactory episodes.  Orgasms that are not satisfactory in sensation or frequency.  Low sexual satisfaction in either partner because of erection problems.  A curved penis occurring with erection. The curve may cause pain or may be too curved to allow for intercourse.  Never having nighttime erections. DIAGNOSIS Your caregiver can often diagnose this condition by:  Performing a physical exam to find other diseases or specific problems with the penis.  Asking you detailed questions about the problem.  Performing blood tests to check for diabetes mellitus or to measure hormone levels.  Performing urine tests to find other underlying health conditions.  Performing an ultrasound exam to check for scarring.  Performing a test to check blood flow to the penis.  Doing a sleep study at home to measure nighttime erections. TREATMENT   You may be prescribed medicines by mouth.  You may be given medicine injections into the penis.  You may be prescribed a vacuum pump with a ring.  Penile implant surgery may be performed. You may receive:  An  inflatable implant.  A semirigid implant.  Blood vessel surgery may be performed. HOME CARE INSTRUCTIONS  If you are prescribed oral medicine, you should take the medicine as prescribed. Do not increase the dosage without first discussing it with your physician.  If you are using self-injections, be careful to avoid any veins that are on the surface of the penis. Apply pressure to the injection site for 5 minutes.  If you are using a vacuum pump, make sure you have read the instructions before using it. Discuss any questions with your physician before taking the pump home. SEEK MEDICAL CARE IF:  You experience pain that is not responsive to the pain medicine you have been prescribed.  You experience nausea or vomiting. SEEK IMMEDIATE MEDICAL CARE IF:   When taking oral or injectable medications, you experience an erection that lasts longer than 4 hours. If your physician is unavailable, go to the nearest emergency room for evaluation. An erection that lasts much longer than 4 hours can result in permanent damage to your penis.  You have pain that is severe.  You develop redness, severe pain, or severe swelling of your penis.  You have redness spreading up into your groin or lower abdomen.  You are unable to pass your urine. This information is not intended  to replace advice given to you by your health care provider. Make sure you discuss any questions you have with your health care provider. Document Released: 03/11/2000 Document Revised: 11/14/2012 Document Reviewed: 08/16/2012 Elsevier Interactive Patient Education  2017 [employer].

## 2016-04-29 NOTE — Assessment & Plan Note (Signed)
This is a new problem. Symptoms of fatigue concerning for sleep apnea given hypersomnolence, snoring and periods of observed apnea. EPSS of 12. Other considerations include metabolic, psychological, or cardiovascular origin. Obtain TSH, testosterone, IBC panel, hemoglobin A1c, CBC, and B 12/folate. Refer to sleep medicine for possible sleep apnea testing. Follow-up pending blood work.

## 2016-04-29 NOTE — Assessment & Plan Note (Signed)
Erectile dysfunction of secondary cause most likely multifactorial. Metabolic testing will be completed as listed under fatigue. Discussed options of treatment including medication and referral to urology. Patient wishes to research medication first. Information provided and after visit summary. Continue to monitor.

## 2016-04-29 NOTE — Addendum Note (Signed)
Addended by: Mauricio Po D on: 04/29/2016 01:19 PM   Modules accepted: Orders

## 2016-04-29 NOTE — Telephone Encounter (Signed)
Sildenafil 20 tablets   [facility]

## 2016-05-02 ENCOUNTER — Other Ambulatory Visit (INDEPENDENT_AMBULATORY_CARE_PROVIDER_SITE_OTHER): Payer: 59

## 2016-05-02 DIAGNOSIS — R5383 Other fatigue: Secondary | ICD-10-CM

## 2016-05-02 LAB — CBC
HCT: 41.2 % (ref 39.0–52.0)
Hemoglobin: 13.8 g/dL (ref 13.0–17.0)
MCHC: 33.4 g/dL (ref 30.0–36.0)
MCV: 82.5 fl (ref 78.0–100.0)
Platelets: 256 10*3/uL (ref 150.0–400.0)
RBC: 4.99 Mil/uL (ref 4.22–5.81)
RDW: 15 % (ref 11.5–15.5)
WBC: 3.9 10*3/uL — ABNORMAL LOW (ref 4.0–10.5)

## 2016-05-02 LAB — TSH: TSH: 1.95 u[IU]/mL (ref 0.35–4.50)

## 2016-05-02 LAB — HEMOGLOBIN A1C: HEMOGLOBIN A1C: 6.1 % (ref 4.6–6.5)

## 2016-05-02 LAB — TESTOSTERONE: Testosterone: 349 ng/dL (ref 300.00–890.00)

## 2016-05-02 LAB — IBC PANEL
IRON: 72 ug/dL (ref 42–165)
Saturation Ratios: 18.8 % — ABNORMAL LOW (ref 20.0–50.0)
Transferrin: 274 mg/dL (ref 212.0–360.0)

## 2016-05-02 LAB — B12 AND FOLATE PANEL
FOLATE: 7.3 ng/mL (ref >5.9)
VITAMIN B 12: 227 pg/mL (ref 211–911)

## 2016-05-03 ENCOUNTER — Telehealth: Payer: Self-pay | Admitting: Emergency Medicine

## 2016-05-03 NOTE — Telephone Encounter (Signed)
Pt called you back about his lab results. Please give him a call back when you get a chance. Thanks.

## 2016-05-04 NOTE — Telephone Encounter (Signed)
Pt aware of results 

## 2016-05-20 ENCOUNTER — Encounter: Payer: Self-pay | Admitting: Internal Medicine

## 2016-05-20 ENCOUNTER — Ambulatory Visit (INDEPENDENT_AMBULATORY_CARE_PROVIDER_SITE_OTHER): Payer: 59 | Admitting: Internal Medicine

## 2016-05-20 DIAGNOSIS — J029 Acute pharyngitis, unspecified: Secondary | ICD-10-CM

## 2016-05-20 DIAGNOSIS — J069 Acute upper respiratory infection, unspecified: Secondary | ICD-10-CM | POA: Insufficient documentation

## 2016-05-20 LAB — POCT RAPID STREP A (OFFICE): RAPID STREP A SCREEN: NEGATIVE

## 2016-05-20 MED ORDER — PROMETHAZINE-CODEINE 6.25-10 MG/5ML PO SYRP: 5.0000 mL | ORAL_SOLUTION | ORAL | 0 refills | Status: DC | PRN

## 2016-05-20 MED ORDER — CEFDINIR 300 MG PO CAPS: 300.0000 mg | ORAL_CAPSULE | Freq: Two times a day (BID) | ORAL | 0 refills | Status: DC

## 2016-05-20 NOTE — Progress Notes (Signed)
Subjective:  Patient ID: [name], male    DOB: Oct 18, 1965  Age: 51 y.o. MRN: [id number]  CC: Sinus Problem (Sore throat, post nasal drip, cough, dry, night sweats, body ache, phlegm, )   HPI [name] presents for ST x 3 d, R>>L, R ear pain, cough  Outpatient Medications Prior to Visit  Medication Sig Dispense Refill  . sildenafil (REVATIO) 20 MG tablet Take 1-5 tablets (20-100 mg total) by mouth daily as needed. 50 tablet 0   No facility-administered medications prior to visit.     ROS Review of Systems  Constitutional: Negative for appetite change, fatigue and unexpected weight change.  HENT: Positive for sinus pressure, sore throat, trouble swallowing and voice change. Negative for congestion, nosebleeds and sneezing.   Eyes: Negative for itching and visual disturbance.  Respiratory: Positive for cough.   Cardiovascular: Negative for chest pain, palpitations and leg swelling.  Gastrointestinal: Negative for abdominal distention, blood in stool, diarrhea and nausea.  Genitourinary: Negative for frequency and hematuria.  Musculoskeletal: Negative for back pain, gait problem, joint swelling and neck pain.  Skin: Negative for rash.  Neurological: Negative for dizziness, tremors, speech difficulty and weakness.  Psychiatric/Behavioral: Negative for agitation, dysphoric mood and sleep disturbance. The patient is not nervous/anxious.     Objective:  BP (!) 142/90   Pulse 71   Temp 98.6 F (37 C) (Oral)   Resp 16   Ht 5\' 8"  (1.727 m)   Wt 205 lb (93 kg)   SpO2 98%   BMI 31.17 kg/m   BP Readings from Last 3 Encounters:  05/20/16 (!) 142/90  04/29/16 128/72  01/15/16 (!) 158/102    Wt Readings from Last 3 Encounters:  05/20/16 205 lb (93 kg)  04/29/16 206 lb (93.4 kg)  01/15/16 202 lb 6.4 oz (91.8 kg)    Physical Exam  Constitutional: He is oriented to person, place, and time. He appears well-developed. No distress.  NAD  HENT:  Mouth/Throat:  Oropharynx is clear and moist. No oropharyngeal exudate.  Eyes: Conjunctivae are normal. Pupils are equal, round, and reactive to light. Right eye exhibits no discharge. Left eye exhibits no discharge.  Neck: Normal range of motion. No JVD present. No thyromegaly present.  Cardiovascular: Normal rate, regular rhythm, normal heart sounds and intact distal pulses.  Exam reveals no gallop and no friction rub.   No murmur heard. Pulmonary/Chest: Effort normal and breath sounds normal. No respiratory distress. He has no wheezes. He has no rales. He exhibits no tenderness.  Abdominal: Soft. Bowel sounds are normal. He exhibits no distension and no mass. There is no tenderness. There is no rebound and no guarding.  Musculoskeletal: Normal range of motion. He exhibits tenderness. He exhibits no edema.  Lymphadenopathy:    He has no cervical adenopathy.  Neurological: He is alert and oriented to person, place, and time. He has normal reflexes. No cranial nerve deficit. He exhibits normal muscle tone. He displays a negative Romberg sign. Coordination and gait normal.  Skin: Skin is warm and dry. No rash noted.  Psychiatric: He has a normal mood and affect. His behavior is normal. Judgment and thought content normal.  eryth throat R>>L LN behind R ear is swollen, tender  Lab Results  Component Value Date   WBC 3.9 (L) 05/02/2016   HGB 13.8 05/02/2016   HCT 41.2 05/02/2016   PLT 256.0 05/02/2016   GLUCOSE 104 (H) 11/17/2015   CHOL 270 (H) 11/17/2015   TRIG 68.0 11/17/2015  HDL 76.70 11/17/2015   LDLCALC 180 (H) 11/17/2015   ALT 18 11/17/2015   AST 24 11/17/2015   NA 138 11/17/2015   K 5.1 11/17/2015   CL 101 11/17/2015   CREATININE 0.90 11/17/2015   BUN 11 11/17/2015   CO2 29 11/17/2015   TSH 1.95 05/02/2016   PSA 2.58 11/17/2015   HGBA1C 6.1 05/02/2016    No results found.  Assessment & Plan:   There are no diagnoses linked to this encounter. I have discontinued Mr. Sirrine  sildenafil. I am also having him maintain his acetaminophen.  Meds ordered this encounter  Medications  . acetaminophen (TYLENOL) 500 MG tablet    Sig: Take 500 mg by mouth every 6 (six) hours as needed.     Follow-up: No Follow-up on file.  Walker Kehr, MD

## 2016-05-20 NOTE — Patient Instructions (Signed)
Use over-the-counter  "cold" medicines  such as "Tylenol cold" , "Advil cold",  "Mucinex" or" Mucinex D"  for cough and congestion.   Avoid decongestants if you have high blood pressure and use "Afrin" nasal spray for nasal congestion as directed instead. Use" Delsym" or" Robitussin" cough syrup varietis for cough.  You can use plain "Tylenol" or "Advil" for fever, chills and achyness. Use Halls or Ricola cough drops.  Please, make an appointment if you are not better or if you're worse.  

## 2016-05-20 NOTE — Progress Notes (Signed)
Pre-visit discussion using our clinic review tool. No additional management support is needed unless otherwise documented below in the visit note.  

## 2016-05-20 NOTE — Assessment & Plan Note (Signed)
Tonsillitis/adenopathy Cefdinir Rx Strep test Prom cod Labs if not better - CBC

## 2016-05-24 ENCOUNTER — Encounter: Payer: Self-pay | Admitting: Family

## 2016-05-24 ENCOUNTER — Institutional Professional Consult (permissible substitution): Payer: 59 | Admitting: Neurology

## 2016-06-09 ENCOUNTER — Encounter: Payer: Self-pay | Admitting: Neurology

## 2016-06-09 ENCOUNTER — Ambulatory Visit (INDEPENDENT_AMBULATORY_CARE_PROVIDER_SITE_OTHER): Payer: 59 | Admitting: Neurology

## 2016-06-09 VITALS — BP 131/72 | HR 73 | Ht 67.0 in | Wt 200.0 lb

## 2016-06-09 DIAGNOSIS — G4710 Hypersomnia, unspecified: Secondary | ICD-10-CM | POA: Diagnosis not present

## 2016-06-09 DIAGNOSIS — F515 Nightmare disorder: Secondary | ICD-10-CM | POA: Insufficient documentation

## 2016-06-09 DIAGNOSIS — G4750 Parasomnia, unspecified: Secondary | ICD-10-CM | POA: Diagnosis not present

## 2016-06-09 DIAGNOSIS — G4719 Other hypersomnia: Secondary | ICD-10-CM | POA: Diagnosis not present

## 2016-06-09 DIAGNOSIS — G4730 Sleep apnea, unspecified: Secondary | ICD-10-CM | POA: Diagnosis not present

## 2016-06-09 DIAGNOSIS — J680 Bronchitis and pneumonitis due to chemicals, gases, fumes and vapors: Secondary | ICD-10-CM | POA: Insufficient documentation

## 2016-06-09 NOTE — Patient Instructions (Signed)
Melatonin 5 mg before bed time,  Reduce alcohol intake before sleep time Please remember to try to maintain good sleep hygiene, which means: Keep a regular sleep and wake schedule, try not to exercise or have a meal within 2 hours of your bedtime, try to keep your bedroom conducive for sleep, that is, cool and dark, without light distractors such as an illuminated alarm clock, and refrain from watching TV right before sleep or in the middle of the night and do not keep the TV or radio on during the night. Also, try not to use or play on electronic devices at bedtime, such as your cell phone, tablet PC or laptop. If you like to read at bedtime on an electronic device, try to dim the background light as much as possible. Do not eat in the middle of the night.    We will call you with the sleep study results and make a follow up appointment if needed.

## 2016-06-09 NOTE — Progress Notes (Signed)
SLEEP MEDICINE CLINIC   Provider:  Larey Seat, M D  Referring Provider: Golden Circle, FNP Primary Care Physician:  Mauricio Po, [place]  Chief Complaint  Patient presents with  . Hypersomnia    rm 10, New Pt, wife- [name], "waking up every ten minutes, stop breathing during sleep, very tired in the mornings"    HPI:  [name] is a 51 y.o. male , seen here as a referral  from Dr. Elna Breslow for a sleep consultation.  [name] wife, [name], as reported that her husband stops frequently to breathe during his sleep and he is also very loud snorer. He snores louder when he is on his back but she has also noticed the same sleep disordered breathing when he is on his right side. He wakes up frequently during the night - it seems as if woken every 10 minutes.  During some of these wake periods he will go and eat.  The patient has seen his primary care provider for colorectal cancer screening. He has been evaluated for ED, and primarily for fatigue. His Epworth sleepiness score was endorsed at 12 points while in the primary care office and CBC vitamin P61 and folic acid and metabolic panels labs were ordered. He was then referred to sleep medicine.  Chief complaint according to patient : " I don't feel that my sleep refreshes or restored me"  Sleep habits are as follows: Mr. Isensee usually reaches home after work at 4 PM his wife will reach home at 8:30 PM, the usually each together and he will go to bed earlier than her. His usual bedtime is around 10 or 11 PM, he describes the bedroom is cool, quiet and dark. But than the TV is on in the background until Mrs. Trethewey enters. She has noticed that he has more dream activity and more intense dreaming, kicking and restlessness when her husband has a TV running in the background while sleeping. Mr. Eveleth will start snoring early in his nocturnal sleep, she has not noticed a strong positional component, he sleeps on a bed  with 3 pillows for head support. He usually has 2 bathroom breaks at night, he often wakes up around 3 AM but usually can go back to sleep.His alarm will ring at 5:30 and he will begin working at 7 AM. He feels he has his deepest sleep in the early morning hours and this is also when his wife finds him snoring the loudest. She reports him twitching and kicking acting out and moving restlessly during the night, it seems that he struggles to find a good position. She reports him yelling out, thrashing, boxing, and actually appearing annoyed, or scared. He does not leave the bed when dreaming. .     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 He has brother has been diagnosed . with obstructive sleep apnea.  He had once in his life a possible concussion, no history of traumatic brain injury otherwise no ENT surgeries and no neck surgeries. He has been a sleep walker and is on occasion doing this now as an adult his wife has witnessed 2 of these. No night terror but sleep eating.  Social history: remarried to [place], both spouses have children from previous marriages Mrs. Baldo Daub 3 Mr. Gubbels 2 adult children. 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  He likes on occasion to smoke cigar but he is not a daily smoker or even weekly smoker.He likes to drink beer, 2 drinks of beer per night. He has been drinking one cup in the morning of coffee, but he does not drink sodas, rarely ice tea.  Review of Systems: Out of a complete 14 system review, the patient complains of only the following symptoms, and all other reviewed systems are negative.  Snoring, coughing , non productive;  Fatigue and sleepiness, micro sleep attacks, sleep eating, sleep walking. Possible REM BD    Epworth score  17 to 22 , Fatigue severity  score 31  , depression score .   Social History   Social History  . Marital status: Married    Spouse name: [name]  . Number of children: 2  . Years of education: 12   Occupational History  . [employer]    Social History Main Topics  . Smoking status: Former Smoker    Quit date: 06/09/2012  . Smokeless tobacco: Never Used  . Alcohol use 1.2 oz/week    2 Cans of beer per week  . Drug use: No  . Sexual activity: Not on file   Other Topics Concern  . Not on file   Social History Narrative   Fun: [name]   Lives at home with wife   Caffeine - coffee, one daily    Family History  Problem Relation Age of Onset  . Cataracts Mother   . Dementia Mother   . Hypertension Father   . Kidney disease Father   . Glaucoma Maternal Grandmother   . Hypertension Paternal Grandfather   . Other Brother     dialysis  . Other Brother     dialysis    Past Medical History:  Diagnosis Date  . 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 CARPAL TUNNEL RELEASE Right 2014    Current Outpatient Prescriptions  Medication Sig Dispense Refill  . acetaminophen (TYLENOL) 500 MG tablet Take 500 mg by mouth every 6 (six) hours as needed.    . cefdinir (OMNICEF) 300 MG capsule Take 1 capsule (300 mg total) by mouth 2 (two) times daily. 20 capsule 0  . promethazine-codeine (PHENERGAN WITH CODEINE) 6.25-10 MG/5ML syrup Take 5 mLs by mouth every 4 (four) hours as needed. 300 mL 0   No current facility-administered medications for this visit.     Allergies as of 06/09/2016 - Review Complete 06/09/2016  Allergen Reaction Noted  . Doxycycline  09/29/2013    Vitals: BP 131/72   Pulse 73   Ht 5\' 7"  (1.702 m)   Wt 200 lb (90.7 kg)   BMI 31.32 kg/m  Last Weight:  Wt Readings from Last 1 Encounters:  06/09/16 200 lb (90.7 kg)   PPI:RJJO mass index is 31.32 kg/m.     Last Height:   Ht Readings from Last 1 Encounters:  06/09/16  5\' 7"  (1.702 m)    Physical exam:  General: The patient is awake, alert and appears not in acute distress. The patient is well groomed. Head: Normocephalic, atraumatic. Neck is supple. Mallampati  5 ,  neck circumference: 16  . Nasal airflow partially obstructed. ,  Retrognathia is seen.  Cardiovascular:  Regular rate and rhythm, without  murmurs or carotid bruit, and without distended neck veins. Respiratory: Lungs  are clear to auscultation. Skin:  Without evidence of edema, or rash Trunk: BMI is elevated. The patient's posture is stooped   Neurologic exam : The patient is awake and alert, oriented to place and time.     Attention span & concentration ability appears normal.  Speech is fluent,  without  dysarthria, dysphonia or aphasia.  Mood and affect are appropriate.  Cranial nerves: Pupils are equal and briskly reactive to light. Funduscopic exam without evidence of pallor or edema. Extraocular movements  in vertical and horizontal planes intact and without nystagmus. Visual fields by finger perimetry are intact.Hearing to finger rub intact. Facial sensation intact to fine touch.Facial motor strength is symmetric , the tongue is tremulous and uvula move midline. Shoulder shrug was symmetrical.   Motor exam:  Normal tone, muscle bulk and symmetric strength in all extremities. Sensory:  Fine touch, pinprick and vibration were tested in all extremities. Proprioception tested in the upper extremities was normal. Coordination: Rapid alternating movements in the fingers/hands was normal. Finger-to-nose maneuver  normal without evidence of ataxia, dysmetria or tremor. Gait and station: Patient walks without assistive device and is able unassisted to climb up to the exam table. Strength within normal limits.  Stance is stable and normal.    Deep tendon reflexes: in the  upper and lower extremities are symmetric and intact. Babinski maneuver response is downgoing.  I was able to review the  recent results from Dr. Elisabeth Most practice,all labs I normal level.   Assessment:  After physical and neurologic examination, review of laboratory studies,  Personal review of imaging studies, reports of other /same  Imaging studies ,  Results of polysomnography/ neurophysiology testing and pre-existing records as far as provided in visit., my assessment is   1)  as clearly witnessed by his wife as patient has sleep apnea and I think people find confirmation with a sleep test my only question is how severe the sleep apnea may be. Loud snoring does not mean more severe sleep apnea, but several treatment options for apnea will address his snoring as well. One basic treatment options is always weight loss but sometimes cannot completely eliminate apnea. Mr. Naclerio has retrograde via and he has a very narrow airway. He is not morbidly obese and his neck circumference is 16 inches, but he seems to suffer from frequent apnea attacks at night.  2) hypersomnia -He has also developed microsleep attacks, these are in response to sleep deprivation at night. Also he feels that he sleeps most nights through he probably gets less than 6 hours of quality sleep given that he has multiple arousals. He also has frequent bathroom breaks.  3)Another concern is vivid dreaming and acting out dreams for which the patient is amnestic he does not recall the dream enactment he does usually not leave the bed but yells thresher kicks for a minute or 2 , he has accidentally broke a Ecologist, he has kicked a bed partner out of bed before... We will investigate further with a parasomnia montage and a attention to possible REM sleep enactment.   I will order a split night polysomnography, with parasomnia montage. In the meantime until the sleep study will be confirmed by his insurance I would like him to take melatonin 5 mg before bedtime this can help to reduce the dream frequency and the enactment.  The patient was advised of the  nature of the diagnosed sleep disorder , the treatment options and risks for general a health and wellness arising  from not treating the condition.  I spent more than 40 minutes of face to face time with the patient. Greater than 50% of time was spent in counseling and coordination of care. We have discussed the diagnosis and differential and I answered the patient's questions.           Asencion Partridge Nickol Collister MD  06/09/2016   CC: [facility], [place] [address] [place], [place] [address]

## 2016-06-15 ENCOUNTER — Encounter: Payer: Self-pay | Admitting: Internal Medicine

## 2016-06-15 ENCOUNTER — Ambulatory Visit (INDEPENDENT_AMBULATORY_CARE_PROVIDER_SITE_OTHER): Payer: 59 | Admitting: Internal Medicine

## 2016-06-15 ENCOUNTER — Other Ambulatory Visit (INDEPENDENT_AMBULATORY_CARE_PROVIDER_SITE_OTHER): Payer: 59

## 2016-06-15 VITALS — BP 150/90 | HR 74 | Temp 98.5°F | Ht 67.0 in | Wt 202.0 lb

## 2016-06-15 DIAGNOSIS — M109 Gout, unspecified: Secondary | ICD-10-CM | POA: Insufficient documentation

## 2016-06-15 LAB — COMPREHENSIVE METABOLIC PANEL
ALBUMIN: 4.6 g/dL (ref 3.5–5.2)
ALT: 18 U/L (ref 0–53)
AST: 25 U/L (ref 0–37)
Alkaline Phosphatase: 65 U/L (ref 39–117)
BUN: 12 mg/dL (ref 6–23)
CO2: 26 mEq/L (ref 19–32)
Calcium: 10.1 mg/dL (ref 8.4–10.5)
Chloride: 106 mEq/L (ref 96–112)
Creatinine, Ser: 0.98 mg/dL (ref 0.40–1.50)
GFR: 102.96 mL/min (ref >60.00)
Glucose, Bld: 97 mg/dL (ref 70–99)
POTASSIUM: 4 meq/L (ref 3.5–5.1)
SODIUM: 139 meq/L (ref 135–145)
TOTAL PROTEIN: 7.9 g/dL (ref 6.0–8.3)
Total Bilirubin: 0.4 mg/dL (ref 0.2–1.2)

## 2016-06-15 LAB — URIC ACID: URIC ACID, SERUM: 6.9 mg/dL (ref 4.0–7.8)

## 2016-06-15 LAB — CBC WITH DIFFERENTIAL/PLATELET
Basophils Absolute: 0.1 10*3/uL (ref 0.0–0.1)
Basophils Relative: 0.9 % (ref 0.0–3.0)
EOS PCT: 3.7 % (ref 0.0–5.0)
Eosinophils Absolute: 0.2 10*3/uL (ref 0.0–0.7)
HEMATOCRIT: 41.2 % (ref 39.0–52.0)
HEMOGLOBIN: 13.5 g/dL (ref 13.0–17.0)
LYMPHS PCT: 22.7 % (ref 12.0–46.0)
Lymphs Abs: 1.5 10*3/uL (ref 0.7–4.0)
MCHC: 32.8 g/dL (ref 30.0–36.0)
MCV: 83.7 fl (ref 78.0–100.0)
Monocytes Absolute: 0.6 10*3/uL (ref 0.1–1.0)
Monocytes Relative: 9.4 % (ref 3.0–12.0)
Neutro Abs: 4.2 10*3/uL (ref 1.4–7.7)
Neutrophils Relative %: 63.3 % (ref 43.0–77.0)
Platelets: 246 10*3/uL (ref 150.0–400.0)
RBC: 4.93 Mil/uL (ref 4.22–5.81)
RDW: 15.4 % (ref 11.5–15.5)
WBC: 6.6 10*3/uL (ref 4.0–10.5)

## 2016-06-15 MED ORDER — INDOMETHACIN 50 MG PO CAPS: 50.0000 mg | ORAL_CAPSULE | Freq: Three times a day (TID) | ORAL | 0 refills | Status: DC

## 2016-06-15 NOTE — Patient Instructions (Addendum)
You have gout.   Take the medication as prescribed three times daily with food.  Take for two days after your pain stops.  If your pain does not improve call and let us know.   Have blood work done today    Gout Gout is painful swelling that can occur in some of your joints. Gout is a type of arthritis. This condition is caused by having too much uric acid in your body. Uric acid is a chemical that forms when your body breaks down substances called purines. Purines are important for building body proteins. When your body has too much uric acid, sharp crystals can form and build up inside your joints. This causes pain and swelling. Gout attacks can happen quickly and be very painful (acute gout). Over time, the attacks can affect more joints and become more frequent (chronic gout). Gout can also cause uric acid to build up under your skin and inside your kidneys. What are the causes? This condition is caused by too much uric acid in your blood. This can occur because:  Your kidneys do not remove enough uric acid from your blood. This is the most common cause.  Your body makes too much uric acid. This can occur with some cancers and cancer treatments. It can also occur if your body is breaking down too many red blood cells (hemolytic anemia).  You eat too many foods that are high in purines. These foods include organ meats and some seafood. Alcohol, especially beer, is also high in purines. A gout attack may be triggered by trauma or stress. What increases the risk? This condition is more likely to develop in people who:  Have a family history of gout.  Are male and middle-aged.  Are male and have gone through menopause.  Are obese.  Frequently drink alcohol, especially beer.  Are dehydrated.  Lose weight too quickly.  Have an organ transplant.  Have lead poisoning.  Take certain medicines, including aspirin, cyclosporine, diuretics, levodopa, and niacin.  Have kidney  disease or psoriasis. What are the signs or symptoms? An attack of acute gout happens quickly. It usually occurs in just one joint. The most common place is the big toe. Attacks often start at night. Other joints that may be affected include joints of the feet, ankle, knee, fingers, wrist, or elbow. Symptoms may include:  Severe pain.  Warmth.  Swelling.  Stiffness.  Tenderness. The affected joint may be very painful to touch.  Shiny, red, or purple skin.  Chills and fever. Chronic gout may cause symptoms more frequently. More joints may be involved. You may also have white or yellow lumps (tophi) on your hands or feet or in other areas near your joints. How is this diagnosed? This condition is diagnosed based on your symptoms, medical history, and physical exam. You may have tests, such as:  Blood tests to measure uric acid levels.  Removal of joint fluid with a needle (aspiration) to look for uric acid crystals.  X-rays to look for joint damage. How is this treated? Treatment for this condition has two phases: treating an acute attack and preventing future attacks. Acute gout treatment may include medicines to reduce pain and swelling, including:  NSAIDs.  Steroids. These are strong anti-inflammatory medicines that can be taken by mouth (orally) or injected into a joint.  Colchicine. This medicine relieves pain and swelling when it is taken soon after an attack. It can be given orally or through an IV tube. Preventive treatment  may include:  Daily use of smaller doses of NSAIDs or colchicine.  Use of a medicine that reduces uric acid levels in your blood.  Changes to your diet. You may need to see a specialist about healthy eating (dietitian). Follow these instructions at home: During a Gout Attack   If directed, apply ice to the affected area:  Put ice in a plastic bag.  Place a towel between your skin and the bag.  Leave the ice on for 20 minutes, 2-3 times a  day.  Rest the joint as much as possible. If the affected joint is in your leg, you may be given crutches to use.  Raise (elevate) the affected joint above the level of your heart as often as possible.  Drink enough fluids to keep your urine clear or pale yellow.  Take over-the-counter and prescription medicines only as told by your health care provider.  Do not drive or operate heavy machinery while taking prescription pain medicine.  Follow instructions from your health care provider about eating or drinking restrictions.  Return to your normal activities as told by your health care provider. Ask your health care provider what activities are safe for you. Avoiding Future Gout Attacks   Follow a low-purine diet as told by your dietitian or health care provider. Avoid foods and drinks that are high in purines, including liver, kidney, anchovies, asparagus, herring, mushrooms, mussels, and beer.  Limit alcohol intake to no more than 1 drink a day for nonpregnant women and 2 drinks a day for men. One drink equals 12 oz of beer, 5 oz of wine, or 1 oz of hard liquor.  Maintain a healthy weight or lose weight if you are overweight. If you want to lose weight, talk with your health care provider. It is important that you do not lose weight too quickly.  Start or maintain an exercise program as told by your health care provider.  Drink enough fluids to keep your urine clear or pale yellow.  Take over-the-counter and prescription medicines only as told by your health care provider.  Keep all follow-up visits as told by your health care provider. This is important. Contact a health care provider if:  You have another gout attack.  You continue to have symptoms of a gout attack after10 days of treatment.  You have side effects from your medicines.  You have chills or a fever.  You have burning pain when you urinate.  You have pain in your lower back or belly. Get help right away  if:  You have severe or uncontrolled pain.  You cannot urinate. This information is not intended to replace advice given to you by your health care provider. Make sure you discuss any questions you have with your health care provider. Document Released: 03/11/2000 Document Revised: 08/20/2015 Document Reviewed: 12/25/2014 Elsevier Interactive Patient Education  2017 [employer].

## 2016-06-15 NOTE — Assessment & Plan Note (Signed)
Acute gout Check uric acid, cmp Start indomethacin TIDwc - take for two days after pain resolves Info given about gout Advised dietary changes, drinking more His brother has gout - discussed if he has recurrent attacks he may need allopurinol  Call if no improvement

## 2016-06-15 NOTE — Progress Notes (Signed)
Pre visit review using our clinic review tool, if applicable. No additional management support is needed unless otherwise documented below in the visit note. 

## 2016-06-15 NOTE — Progress Notes (Signed)
Subjective:    Patient ID: [name], male    DOB: March 28, 1966, 51 y.o.   MRN: [id number]  HPI He is here for an acute visit.   Left ankle pain: He started having left ankle pain at 3 am this morning.  He got up to go to the bathroom and it was very painful.  The ankle was fine when he went to bed.  The pain is throughout the ankle.  He thinks it is a little swollen.  It may be a little warm.  He denies changes in the skin color.    He has never had gout.  His brother has had gout.  He is prediabetic.   Medications and allergies reviewed with patient and updated if appropriate.  Patient Active Problem List   Diagnosis Date Noted  . Bronchitis due to chemical ([place]) 06/09/2016  . Hypersomnia with sleep apnea 06/09/2016  . Excessive daytime sleepiness 06/09/2016  . 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 Nightmares REM-sleep type 06/09/2016  . Upper respiratory infection 05/20/2016  . Fatigue 04/29/2016  . Erectile dysfunction due to diseases classified elsewhere 04/29/2016  . Acute upper respiratory infection 01/15/2016  . Sebaceous cyst 01/15/2016  . Routine general medical examination at a health care facility 11/17/2015  . Overweight (BMI 25.0-29.9) 11/17/2015    Current Outpatient Prescriptions on File Prior to Visit  Medication Sig Dispense Refill  . acetaminophen (TYLENOL) 500 MG tablet Take 500 mg by mouth every 6 (six) hours as needed.     No current facility-administered medications on file prior to visit.     Past Medical History:  Diagnosis Date  . 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 CARPAL TUNNEL RELEASE Right 2014    Social History   Social History  . Marital status: Married    Spouse name: Kendrick Fries  . Number of children: 2  . Years of education: 12   Occupational History  . [employer]    Social History Main Topics  . Smoking status: Former Smoker    Quit date: 06/09/2012  .  Smokeless tobacco: Never Used  . Alcohol use 1.2 oz/week    2 Cans of beer per week  . Drug use: No  . Sexual activity: Not Asked   Other Topics Concern  . None   Social History Narrative   Fun: Cook   Lives at home with wife   Caffeine - coffee, one daily    Family History  Problem Relation Age of Onset  . Cataracts Mother   . Dementia Mother   . Hypertension Father   . Kidney disease Father   . Glaucoma Maternal Grandmother   . Hypertension Paternal Grandfather   . Other Brother     dialysis  . Other Brother     dialysis    Review of Systems  Constitutional: Negative for chills and fever.  Musculoskeletal: Positive for arthralgias and joint swelling.  Skin: Negative for color change.  Neurological: Negative for numbness (in foot).       Objective:   Vitals:   06/15/16 1347  BP: (!) 150/90  Pulse: 74  Temp: 98.5 F (36.9 C)   Filed Weights   06/15/16 1347  Weight: 202 lb (91.6 kg)   Body mass index is 31.64 kg/m.  Wt Readings from Last 3 Encounters:  06/15/16 202 lb (91.6 kg)  06/09/16 200 lb (90.7 kg)  05/20/16 205 lb (  93 kg)     Physical Exam  Constitutional: He appears well-developed and well-nourished. No distress.  Musculoskeletal:  Left ankle mild swelling, warmth and tenderness with palpation, pain with passive and active movement  Neurological:  Normal sensation left ankle and foot  Skin: Skin is warm and dry. He is not diaphoretic. No erythema.          Assessment & Plan:   See Problem List for Assessment and Plan of chronic medical problems.

## 2016-06-29 ENCOUNTER — Telehealth: Payer: Self-pay | Admitting: Neurology

## 2016-06-29 DIAGNOSIS — J680 Bronchitis and pneumonitis due to chemicals, gases, fumes and vapors: Secondary | ICD-10-CM

## 2016-06-29 DIAGNOSIS — R0683 Snoring: Secondary | ICD-10-CM

## 2016-06-29 NOTE — Telephone Encounter (Signed)
[employer] denied NPSG.

## 2016-06-29 NOTE — Telephone Encounter (Signed)
Ok to order home sleep test?

## 2016-06-30 ENCOUNTER — Encounter: Payer: Self-pay | Admitting: Family

## 2016-06-30 NOTE — Telephone Encounter (Signed)
I ordered HST.

## 2016-06-30 NOTE — Addendum Note (Signed)
Addended by: Larey Seat on: 06/30/2016 04:26 PM   Modules accepted: Orders

## 2016-07-08 ENCOUNTER — Encounter: Payer: Self-pay | Admitting: Family

## 2016-07-08 ENCOUNTER — Ambulatory Visit (INDEPENDENT_AMBULATORY_CARE_PROVIDER_SITE_OTHER): Payer: 59 | Admitting: Family

## 2016-07-08 VITALS — BP 138/80 | HR 77 | Temp 98.5°F | Resp 18 | Ht 67.0 in | Wt 198.8 lb

## 2016-07-08 DIAGNOSIS — T467X5A Adverse effect of peripheral vasodilators, initial encounter: Secondary | ICD-10-CM

## 2016-07-08 DIAGNOSIS — L918 Other hypertrophic disorders of the skin: Secondary | ICD-10-CM | POA: Diagnosis not present

## 2016-07-08 DIAGNOSIS — N521 Erectile dysfunction due to diseases classified elsewhere: Secondary | ICD-10-CM

## 2016-07-08 MED ORDER — VARDENAFIL HCL 20 MG PO TABS: 20.0000 mg | ORAL_TABLET | Freq: Every day | ORAL | 0 refills | Status: DC | PRN

## 2016-07-08 NOTE — Progress Notes (Signed)
Subjective:    Patient ID: [name], male    DOB: Feb 08, 1966, 51 y.o.   MRN: [id number]  Chief Complaint  Patient presents with  . Nevus    has a mole on his head that has gotten bigger, does not know how long it has been there, wife noticed it    HPI:  [name] is a 51 y.o. 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 and presents today for an office visit.  1.) Skin lesion - This is a new problem. Associated symptom of a mole located on his head that has been going on for an unknown period of time has reportedly changed in size recently. Has been shaving his head and noted over the past couple of months he was bleeding after he shaved. No tenderness.   2.) Erectile dysfunction - previously prescribed sildenafil which she reports taking as prescribed and notes the adverse side effect of itching following taking the medication. Notes symptoms were well controlled with medication regimen. Would like to try different medication.   Allergies  Allergen Reactions  . Doxycycline   . Sildenafil Itching      Outpatient Medications Prior to Visit  Medication Sig Dispense Refill  . acetaminophen (TYLENOL) 500 MG tablet Take 500 mg by mouth every 6 (six) hours as needed.    . indomethacin (INDOCIN) 50 MG capsule Take 1 capsule (50 mg total) by mouth 3 (three) times daily with meals. Take for two days after your symptoms resolve and then stop 30 capsule 0   No facility-administered medications prior to visit.       Past Surgical History:  Procedure Laterality Date  . CARPAL TUNNEL RELEASE Right 2014      Past Medical History:  Diagnosis Date  . 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  Genitourinary: Negative for decreased urine volume, difficulty urinating, dysuria, flank pain, frequency, hematuria, penile pain and urgency.   Positive for erectile dysfunction.  Skin:       Positive for skin lesion      Objective:    BP 138/80 (BP Location: Left Arm, Patient Position: Sitting, Cuff Size: Large)   Pulse 77   Temp 98.5 F (36.9 C) (Oral)   Resp 18   Ht 5\' 7"  (1.702 m)   Wt 198 lb 12.8 oz (90.2 kg)   SpO2 98%   BMI 31.14 kg/m  Nursing note and vital signs reviewed.  Physical Exam  Constitutional: He is oriented to person, place, and time. He appears well-developed and well-nourished. No distress.  Cardiovascular: Normal rate, regular rhythm, normal heart sounds and intact distal pulses.   Pulmonary/Chest: Effort normal and breath sounds normal.  Neurological: He is alert and oriented to person, place, and time.  Skin: Skin is warm and dry.  Left scalp - small patch of hair with skin tag located in the center which is dry and hard. It is colored black with no tenderness elicited. Appears consistent with skin tag.  Psychiatric: He has a normal mood and affect. His behavior is normal. Judgment and thought content normal.   Cryosurgical Removal of Skin Tag  Risks, benefits and alternative discussed. Patient wished to continue with removal with verbal consent. Site was identified on the left side of the scalp and cleansed with alcohol. Histofreezer was prepared according to instructions and applied to the site  for the recommend 40 seconds. The procedure was tolerated well with no complication.      Assessment & Plan:   Problem List Items Addressed This Visit      Musculoskeletal and Integument   Skin tag - Primary    Symptoms and exam are consistent with a skin tag located on the left side of his scalp. Skin tag frozen with Histofreezer with no complications. Post care and follow up care instructions provided.         Other   Erectile dysfunction due to diseases classified elsewhere    Erectile dysfunction with itching noted with previous prescription of sildenafil. Sildenafil added to  allergy/intolerance list. Discontinue sildenafil and start Levitra with sample provided. Follow-up pending trial of medication. Advised to seek emergency care if priapism develops and discontinue medication if itching develops. Continue to monitor.          I am having Mr. Cabiness start on vardenafil. I am also having him maintain his acetaminophen and indomethacin.   Follow-up: Return in about 3 months (around 10/07/2016), or if symptoms worsen or fail to improve.  Mauricio Po, FNP

## 2016-07-08 NOTE — Patient Instructions (Signed)
Thank you for choosing [facility].  SUMMARY AND INSTRUCTIONS:  Monitor for signs of infection.  Clean with soap and water.   May become irriatated over the next 24-48 hours.  Should fall off in the next couple of weeks.   Try Maryruth Bun. 20 mg about 1 hour prior to sexual intercourse.    Medication:  Your prescription(s) have been submitted to your pharmacy or been printed and provided for you. Please take as directed and contact our office if you believe you are having problem(s) with the medication(s) or have any questions.  Follow up:  If your symptoms worsen or fail to improve, please contact our office for further instruction, or in case of emergency go directly to the emergency room at the closest medical facility.

## 2016-07-08 NOTE — Assessment & Plan Note (Signed)
Symptoms and exam are consistent with a skin tag located on the left side of his scalp. Skin tag frozen with Histofreezer with no complications. Post care and follow up care instructions provided.

## 2016-07-08 NOTE — Assessment & Plan Note (Signed)
Erectile dysfunction with itching noted with previous prescription of sildenafil. Sildenafil added to allergy/intolerance list. Discontinue sildenafil and start Levitra with sample provided. Follow-up pending trial of medication. Advised to seek emergency care if priapism develops and discontinue medication if itching develops. Continue to monitor.

## 2016-07-28 ENCOUNTER — Encounter: Payer: Self-pay | Admitting: Gastroenterology

## 2016-09-05 ENCOUNTER — Telehealth: Payer: Self-pay | Admitting: Neurology

## 2016-09-05 NOTE — Telephone Encounter (Signed)
We have attempted to call the patient 3 times to schedule sleep study. Patient has been unavailable at the phone numbers we have on file and has not returned our calls. At this point we will send a letter asking pt to please contact the sleep lab to schedule their sleep study. If patient calls back we will schedule them for their sleep study. ° °

## 2016-09-26 ENCOUNTER — Encounter: Payer: Self-pay | Admitting: Gastroenterology

## 2016-09-26 ENCOUNTER — Ambulatory Visit (AMBULATORY_SURGERY_CENTER): Payer: Self-pay

## 2016-09-26 VITALS — Ht 67.0 in | Wt 206.4 lb

## 2016-09-26 DIAGNOSIS — Z1211 Encounter for screening for malignant neoplasm of colon: Secondary | ICD-10-CM

## 2016-09-26 MED ORDER — NA SULFATE-K SULFATE-MG SULF 17.5-3.13-1.6 GM/177ML PO SOLN: 1.0000 | Freq: Once | ORAL | 0 refills | Status: AC

## 2016-09-26 NOTE — Progress Notes (Signed)
Denies allergies to eggs or soy products. Denies complication of anesthesia or sedation. Denies use of weight loss medication. Denies use of O2.   Emmi instructions given for colonoscopy.  

## 2016-09-27 ENCOUNTER — Ambulatory Visit: Payer: 59 | Admitting: Adult Health

## 2016-10-07 ENCOUNTER — Ambulatory Visit (AMBULATORY_SURGERY_CENTER): Payer: 59 | Admitting: Gastroenterology

## 2016-10-07 ENCOUNTER — Encounter: Payer: Self-pay | Admitting: Gastroenterology

## 2016-10-07 VITALS — BP 124/81 | HR 61 | Temp 98.6°F | Resp 13 | Ht 67.0 in | Wt 206.0 lb

## 2016-10-07 DIAGNOSIS — D120 Benign neoplasm of cecum: Secondary | ICD-10-CM | POA: Diagnosis not present

## 2016-10-07 DIAGNOSIS — D123 Benign neoplasm of transverse colon: Secondary | ICD-10-CM

## 2016-10-07 DIAGNOSIS — Z1212 Encounter for screening for malignant neoplasm of rectum: Secondary | ICD-10-CM

## 2016-10-07 DIAGNOSIS — Z1211 Encounter for screening for malignant neoplasm of colon: Secondary | ICD-10-CM | POA: Diagnosis not present

## 2016-10-07 DIAGNOSIS — K635 Polyp of colon: Secondary | ICD-10-CM

## 2016-10-07 MED ORDER — SODIUM CHLORIDE 0.9 % IV SOLN: 500.0000 mL | INTRAVENOUS | Status: AC

## 2016-10-07 NOTE — Progress Notes (Signed)
Report to PACU, RN, vss, BBS= Clear.  

## 2016-10-07 NOTE — Op Note (Signed)
[facility] Patient Name: [name] Procedure Date: 10/07/2016 9:54 AM MRN: [id number] Endoscopist: Milus Banister , MD Age: 51 Referring MD:  Date of Birth: 11/01/1965 Gender: Male Account #: [id number] Procedure:                Colonoscopy Indications:              Screening for colorectal malignant neoplasm Medicines:                Monitored Anesthesia Care Procedure:                Pre-Anesthesia Assessment:                           - Prior to the procedure, a History and Physical                            was performed, and patient medications and                            allergies were reviewed. The patient's tolerance of                            previous anesthesia was also reviewed. The risks                            and benefits of the procedure and the sedation                            options and risks were discussed with the patient.                            All questions were answered, and informed consent                            was obtained. Prior Anticoagulants: The patient has                            taken no previous anticoagulant or antiplatelet                            agents. ASA Grade Assessment: II - A patient with                            mild systemic disease. After reviewing the risks                            and benefits, the patient was deemed in                            satisfactory condition to undergo the procedure.                           After obtaining informed consent, the colonoscope  was passed under direct vision. Throughout the                            procedure, the patient's blood pressure, pulse, and                            oxygen saturations were monitored continuously. The                            Colonoscope was introduced through the anus and                            advanced to the the cecum, identified by                            appendiceal orifice and  ileocecal valve. The                            colonoscopy was performed without difficulty. The                            patient tolerated the procedure well. The quality                            of the bowel preparation was excellent. The                            ileocecal valve, appendiceal orifice, and rectum                            were photographed. Scope In: 10:02:39 AM Scope Out: 10:12:19 AM Scope Withdrawal Time: 0 hours 7 minutes 57 seconds  Total Procedure Duration: 0 hours 9 minutes 40 seconds  Findings:                 Two sessile polyps were found in the transverse                            colon and cecum. The polyps were 3 to 4 mm in size.                            These polyps were removed with a cold snare.                            Resection and retrieval were complete.                           Multiple small and large-mouthed diverticula were                            found in the left colon.                           The exam was otherwise without abnormality on  direct and retroflexion views. Complications:            No immediate complications. Estimated blood loss:                            None. Estimated Blood Loss:     Estimated blood loss: none. Impression:               - Two 3 to 4 mm polyps in the transverse colon and                            in the cecum, removed with a cold snare. Resected                            and retrieved.                           - Diverticulosis in the left colon.                           - The examination was otherwise normal on direct                            and retroflexion views. Recommendation:           - Patient has a contact number available for                            emergencies. The signs and symptoms of potential                            delayed complications were discussed with the                            patient. Return to normal activities tomorrow.                             Written discharge instructions were provided to the                            patient.                           - Resume previous diet.                           - Continue present medications.                           You will receive a letter within 2-3 weeks with the                            pathology results and my final recommendations.                           If the polyp(s) is proven to be 'pre-cancerous' on  pathology, you will need repeat colonoscopy in 5                            years. If the polyp(s) is NOT 'precancerous' on                            pathology then you should repeat colon cancer                            screening in 10 years with colonoscopy without need                            for colon cancer screening by any method prior to                            then (including stool testing). Milus Banister, MD 10/07/2016 10:14:30 AM This report has been signed electronically.

## 2016-10-07 NOTE — Progress Notes (Signed)
Called to room to assist during endoscopic procedure.  Patient ID and intended procedure confirmed with present staff. Received instructions for my participation in the procedure from the performing physician.  

## 2016-10-07 NOTE — Patient Instructions (Signed)
Impression:  Polyps (handout given) Diverticulosis (handout given)  YOU HAD AN ENDOSCOPIC PROCEDURE TODAY AT [facility]:   Refer to the procedure report that was given to you for any specific questions about what was found during the examination.  If the procedure report does not answer your questions, please call your gastroenterologist to clarify.  If you requested that your care partner not be given the details of your procedure findings, then the procedure report has been included in a sealed envelope for you to review at your convenience later.  YOU SHOULD EXPECT: Some feelings of bloating in the abdomen. Passage of more gas than usual.  Walking can help get rid of the air that was put into your GI tract during the procedure and reduce the bloating. If you had a lower endoscopy (such as a colonoscopy or flexible sigmoidoscopy) you may notice spotting of blood in your stool or on the toilet paper. If you underwent a bowel prep for your procedure, you may not have a normal bowel movement for a few days.  Please Note:  You might notice some irritation and congestion in your nose or some drainage.  This is from the oxygen used during your procedure.  There is no need for concern and it should clear up in a day or so.  SYMPTOMS TO REPORT IMMEDIATELY:   Following lower endoscopy (colonoscopy or flexible sigmoidoscopy):  Excessive amounts of blood in the stool  Significant tenderness or worsening of abdominal pains  Swelling of the abdomen that is new, acute  Fever of 100F or higher  For urgent or emergent issues, a gastroenterologist can be reached at any hour by calling [phone].   DIET:  We do recommend a small meal at first, but then you may proceed to your regular diet.  Drink plenty of fluids but you should avoid alcoholic beverages for 24 hours.  ACTIVITY:  You should plan to take it easy for the rest of today and you should NOT DRIVE or use heavy machinery  until tomorrow (because of the sedation medicines used during the test).    FOLLOW UP: Our staff will call the number listed on your records the next business day following your procedure to check on you and address any questions or concerns that you may have regarding the information given to you following your procedure. If we do not reach you, we will leave a message.  However, if you are feeling well and you are not experiencing any problems, there is no need to return our call.  We will assume that you have returned to your regular daily activities without incident.  If any biopsies were taken you will be contacted by phone or by letter within the next 1-3 weeks.  Please call us at [phone] if you have not heard about the biopsies in 3 weeks.    SIGNATURES/CONFIDENTIALITY: You and/or your care partner have signed paperwork which will be entered into your electronic medical record.  These signatures attest to the fact that that the information above on your After Visit Summary has been reviewed and is understood.  Full responsibility of the confidentiality of this discharge information lies with you and/or your care-partner.

## 2016-10-10 ENCOUNTER — Telehealth: Payer: Self-pay | Admitting: *Deleted

## 2016-10-10 NOTE — Telephone Encounter (Signed)
  Follow up Call-  Call back number 10/07/2016  Post procedure Call Back phone  # [phone]  Permission to leave phone message Yes  Some recent data might be hidden     Patient questions:  Do you have a fever, pain , or abdominal swelling? No. Pain Score  0 *  Have you tolerated food without any problems? Yes.    Have you been able to return to your normal activities? Yes.    Do you have any questions about your discharge instructions: Diet   No. Medications  No. Follow up visit  No.  Do you have questions or concerns about your Care? No.  Actions: * If pain score is 4 or above: No action needed, pain <4.

## 2016-10-10 NOTE — Telephone Encounter (Signed)
  Follow up Call-  Call back number 10/07/2016  Post procedure Call Back phone  # [phone]  Permission to leave phone message Yes  Some recent data might be hidden     No answer, left message.

## 2016-10-13 ENCOUNTER — Encounter: Payer: Self-pay | Admitting: Gastroenterology

## 2016-11-29 ENCOUNTER — Ambulatory Visit (INDEPENDENT_AMBULATORY_CARE_PROVIDER_SITE_OTHER): Payer: 59 | Admitting: Family

## 2016-11-29 ENCOUNTER — Encounter: Payer: Self-pay | Admitting: Family

## 2016-11-29 VITALS — BP 150/92 | HR 76 | Temp 98.5°F | Resp 16 | Ht 67.0 in | Wt 203.4 lb

## 2016-11-29 DIAGNOSIS — M62830 Muscle spasm of back: Secondary | ICD-10-CM | POA: Insufficient documentation

## 2016-11-29 MED ORDER — TIZANIDINE HCL 4 MG PO TABS: 4.0000 mg | ORAL_TABLET | Freq: Four times a day (QID) | ORAL | 0 refills | Status: DC | PRN

## 2016-11-29 NOTE — Progress Notes (Signed)
Subjective:    Patient ID: [name], male    DOB: [birth year], 51 y.o.   MRN: [id number]  Chief Complaint  Patient presents with  . Back Pain    back pain that has been bothering him since last week, constant pain, thinks it is a work related injury    HPI:  [name] is a 51 y.o. 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 and presents today for an acute office visit.  This is a new problem. Associated symptom of pain located in his mid back has been going on for about 1 week. Pain is described as constant and described as achy and sharp. Severity of the pain is disturbing his sleep especially when sleeping on the right side. 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 No numbness or tingling. Modifying factors include Tylenol and icy-hot which helps a little. Improves with massage. Course of the symptoms is staying about the same. Has been able to work with the pain. No previous history of back injury.   Allergies  Allergen Reactions  . Doxycycline   . Sildenafil Itching      Outpatient Medications Prior to Visit  Medication Sig Dispense Refill  . acetaminophen (TYLENOL) 500 MG tablet Take 500 mg by mouth every 6 (six) hours as needed.    . Aspirin-Acetaminophen (GOODYS BODY PAIN PO) Take 1 Package by mouth as needed.     Facility-Administered Medications Prior to Visit  Medication Dose Route Frequency Provider Last Rate Last Dose  . 0.9 %  sodium chloride infusion  500 mL Intravenous Continuous Milus Banister, MD          Past Surgical History:  Procedure Laterality Date  . CARPAL TUNNEL RELEASE Right 2014      Past Medical History:  Diagnosis Date  . Allergy   . Arthritis   . Compartment syndrome ([facility])    right arm   . GERD (gastroesophageal reflux disease)       Review of Systems  Constitutional: Negative for  chills and fever.  Respiratory: Negative for chest tightness and shortness of breath.   Musculoskeletal: Positive for back pain.  Neurological: Negative for weakness and numbness.      Objective:    BP (!) 150/92 (BP Location: Left Arm, Patient Position: Sitting, Cuff Size: Large)   Pulse 76   Temp 98.5 F (36.9 C) (Oral)   Resp 16   Ht 5\' 7"  (1.702 m)   Wt 203 lb 6.4 oz (92.3 kg)   SpO2 98%   BMI 31.86 kg/m  Nursing note and vital signs reviewed.  Physical Exam  Constitutional: He is oriented to person, place, and time. He appears well-developed and well-nourished. No distress.  Cardiovascular: Normal rate, regular rhythm, normal heart sounds and intact distal pulses.   Pulmonary/Chest: Effort normal and breath sounds normal.  Musculoskeletal:  Mid back/upper back - no obvious deformity, discoloration, or edema. Tenderness of the thoracic paraspinal musculature on the left side. Range of motion within normal limits. Distal pulses and sensation are intact and appropriate.  Neurological: He is alert and oriented to person, place, and time.  Skin: Skin is warm and dry.  Psychiatric: He has a normal mood and affect. His behavior is normal. Judgment and thought content normal.       Assessment & Plan:   Problem List Items Addressed This Visit  Other   Spasm of thoracic back muscle - Primary    New-onset spasm of the thoracic back muscle secondary to new activity of buffering. Treat conservatively with ice/moist heat and home exercise therapy. Start Zanaflex as needed for spasm. Follow-up if symptoms worsen or do not improve.          I am having [name] start on tiZANidine. I am also having him maintain his acetaminophen and Aspirin-Acetaminophen (GOODYS BODY PAIN PO). We will continue to administer sodium chloride.   Meds ordered this encounter  Medications  . tiZANidine (ZANAFLEX) 4 MG tablet    Sig: Take 1 tablet (4 mg total) by mouth every 6 (six) hours as  needed for muscle spasms.    Dispense:  40 tablet    Refill:  0    Order Specific Question:   Supervising Provider    Answer:   Pricilla Holm A [[id number]]     Follow-up: Return if symptoms worsen or fail to improve.  Mauricio Po, FNP

## 2016-11-29 NOTE — Patient Instructions (Addendum)
Thank you for choosing [facility].  SUMMARY AND INSTRUCTIONS:  Zanaflex as needed for muscle spasms. Use caution as it may make you sleepy/droswy.  Ice/moist heat every other other and as needed especially after work and before bed.   Stretches and exercises.   Duexis - 1 tablet by mouth 3x per day until completed.    Medication:  Your prescription(s) have been submitted to your pharmacy or been printed and provided for you. Please take as directed and contact our office if you believe you are having problem(s) with the medication(s) or have any questions.  Labs:  Please stop by the lab on the lower level of the building for your blood work. Your results will be released to [place] (or called to you) after review, usually within 72 hours after test completion. If any changes need to be made, you will be notified at that same time.  1.) The lab is open from 7:30am to 5:30 pm Monday-Friday 2.) No appointment is necessary 3.) Fasting (if needed) is 6-8 hours after food and drink; black coffee and water are okay   Imaging / Radiology:  Please stop by radiology on the basement level of the building for your x-rays. Your results will be released to [place] (or called to you) after review, usually within 72 hours after test completion. If any treatments or changes are necessary, you will be notified at that same time.  Referrals:  Referrals have been made during this visit. You should expect to hear back from our schedulers in about 7-10 days in regards to establishing an appointment with the specialists we discussed.   Follow up:  If your symptoms worsen or fail to improve, please contact our office for further instruction, or in case of emergency go directly to the emergency room at the closest medical facility.     Cervical Strain and Sprain Rehab Ask your health care provider which exercises are safe for you. Do exercises exactly as told by your health care provider and  adjust them as directed. It is normal to feel mild stretching, pulling, tightness, or discomfort as you do these exercises, but you should stop right away if you feel sudden pain or your pain gets worse.Do not begin these exercises until told by your health care provider. Stretching and range of motion exercises These exercises warm up your muscles and joints and improve the movement and flexibility of your neck. These exercises also help to relieve pain, numbness, and tingling. Exercise A: Cervical side bend  1. Using good posture, sit on a stable chair or stand up. 2. Without moving your shoulders, slowly tilt your left / right ear to your shoulder until you feel a stretch in your neck muscles. You should be looking straight ahead. 3. Hold for __________ seconds. 4. Repeat with the other side of your neck. Repeat __________ times. Complete this exercise __________ times a day. Exercise B: Cervical rotation  1. Using good posture, sit on a stable chair or stand up. 2. Slowly turn your head to the side as if you are looking over your left / right shoulder. ? Keep your eyes level with the ground. ? Stop when you feel a stretch along the side and the back of your neck. 3. Hold for __________ seconds. 4. Repeat this by turning to your other side. Repeat __________ times. Complete this exercise __________ times a day. Exercise C: Thoracic extension and pectoral stretch 1. Roll a towel or a small blanket so it is about  4 inches (10 cm) in diameter. 2. Lie down on your back on a firm surface. 3. Put the towel lengthwise, under your spine in the middle of your back. It should not be not under your shoulder blades. The towel should line up with your spine from your middle back to your lower back. 4. Put your hands behind your head and let your elbows fall out to your sides. 5. Hold for __________ seconds. Repeat __________ times. Complete this exercise __________ times a day. Strengthening  exercises These exercises build strength and endurance in your neck. Endurance is the ability to use your muscles for a long time, even after your muscles get tired. Exercise D: Upper cervical flexion, isometric 1. Lie on your back with a thin pillow behind your head and a small rolled-up towel under your neck. 2. Gently tuck your chin toward your chest and nod your head down to look toward your feet. Do not lift your head off the pillow. 3. Hold for __________ seconds. 4. Release the tension slowly. Relax your neck muscles completely before you repeat this exercise. Repeat __________ times. Complete this exercise __________ times a day. Exercise E: Cervical extension, isometric  1. Stand about 6 inches (15 cm) away from a wall, with your back facing the wall. 2. Place a soft object, about 6-8 inches (15-20 cm) in diameter, between the back of your head and the wall. A soft object could be a small pillow, a ball, or a folded towel. 3. Gently tilt your head back and press into the soft object. Keep your jaw and forehead relaxed. 4. Hold for __________ seconds. 5. Release the tension slowly. Relax your neck muscles completely before you repeat this exercise. Repeat __________ times. Complete this exercise __________ times a day. Posture and body mechanics  Body mechanics refers to the movements and positions of your body while you do your daily activities. Posture is part of body mechanics. Good posture and healthy body mechanics can help to relieve stress in your body's tissues and joints. Good posture means that your spine is in its natural S-curve position (your spine is neutral), your shoulders are pulled back slightly, and your head is not tipped forward. The following are general guidelines for applying improved posture and body mechanics to your everyday activities. Standing  When standing, keep your spine neutral and keep your feet about hip-width apart. Keep a slight bend in your knees.  Your ears, shoulders, and hips should line up.  When you do a task in which you stand in one place for a long time, place one foot up on a stable object that is 2-4 inches (5-10 cm) high, such as a footstool. This helps keep your spine neutral. Sitting   When sitting, keep your spine neutral and your keep feet flat on the floor. Use a footrest, if necessary, and keep your thighs parallel to the floor. Avoid rounding your shoulders, and avoid tilting your head forward.  When working at a desk or a computer, keep your desk at a height where your hands are slightly lower than your elbows. Slide your chair under your desk so you are close enough to maintain good posture.  When working at a computer, place your monitor at a height where you are looking straight ahead and you do not have to tilt your head forward or downward to look at the screen. Resting When lying down and resting, avoid positions that are most painful for you. Try to support your neck  in a neutral position. You can use a contour pillow or a small rolled-up towel. Your pillow should support your neck but not push on it. This information is not intended to replace advice given to you by your health care provider. Make sure you discuss any questions you have with your health care provider. Document Released: 03/14/2005 Document Revised: 11/19/2015 Document Reviewed: 02/18/2015 Elsevier Interactive Patient Education  2018 [employer].   Mid-Back Strain Rehab Ask your health care provider which exercises are safe for you. Do exercises exactly as told by your health care provider and adjust them as directed. It is normal to feel mild stretching, pulling, tightness, or discomfort as you do these exercises, but you should stop right away if you feel sudden pain or your pain gets worse. Do not begin these exercises until told by your health care provider. Stretching and range of motion exercises This exercise warms up your muscles and  joints and improves the movement and flexibility of your back and shoulders. This exercise also help to relieve pain. Exercise A: Chest and spine stretch  1. Lie down on your back on a firm surface. 2. Roll a towel or a small blanket so it is about 4 inches (10 cm) in diameter. 3. Put the towel lengthwise under the middle of your back so it is under your spine, but not under your shoulder blades. 4. To increase the stretch, you may put your hands behind your head and let your elbows fall to your sides. 5. Hold for __________ seconds. Repeat exercise __________ times. Complete this exercise __________ times a day. Strengthening exercises These exercises build strength and endurance in your back and your shoulder blade muscles. Endurance is the ability to use your muscles for a long time, even after they get tired. Exercise B: Alternating arm and leg raises  1. Get on your hands and knees on a firm surface. If you are on a hard floor, you may want to use padding to cushion your knees, such as an exercise mat. 2. Line up your arms and legs. Your hands should be below your shoulders, and your knees should be below your hips. 3. Lift your left leg behind you. At the same time, raise your right arm and straighten it in front of you. ? Do not lift your leg higher than your hip. ? Do not lift your arm higher than your shoulder. ? Keep your abdominal and back muscles tight. ? Keep your hips facing the ground. ? Do not arch your back. ? Keep your balance carefully, and do not hold your breath. 4. Hold for __________ seconds. 5. Slowly return to the starting position and repeat with your right leg and your left arm. Repeat __________ times. Complete this exercise __________ times a day. Exercise C: Straight arm rows ( shoulder extension) 1. Stand with your feet shoulder width apart. 2. Secure an exercise band to a stable object in front of you so the band is at or above shoulder height. 3. Hold one  end of the exercise band in each hand. 4. Straighten your elbows and lift your hands up to shoulder height. 5. Step back, away from the secured end of the exercise band, until the band stretches. 6. Squeeze your shoulder blades together and pull your hands down to the sides of your thighs. Stop when your hands are straight down by your sides. Do not let your hands go behind your body. 7. Hold for __________ seconds. 8. Slowly return to  the starting position. Repeat __________ times. Complete this exercise __________ times a day. Exercise D: Shoulder external rotation, prone 1. Lie on your abdomen on a firm bed so your left / right forearm hangs over the edge of the bed and your upper arm is on the bed, straight out from your body. ? Your elbow should be bent. ? Your palm should be facing your feet. 2. If instructed, hold a __________ weight in your hand. 3. Squeeze your shoulder blade toward the middle of your back. Do not let your shoulder lift toward your ear. 4. Keep your elbow bent in an "L" shape (90 degrees) while you slowly move your forearm up toward the ceiling. Move your forearm up to the height of the bed, toward your head. ? Your upper arm should not move. ? At the top of the movement, your palm should face the floor. 5. Hold for __________ seconds. 6. Slowly return to the starting position and relax your muscles. Repeat __________ times. Complete this exercise __________ times a day. Exercise E: Scapular retraction and external rotation, rowing  1. Sit in a stable chair without armrests, or stand. 2. Secure an exercise band to a stable object in front of you so it is at shoulder height. 3. Hold one end of the exercise band in each hand. 4. Bring your arms out straight in front of you. 5. Step back, away from the secured end of the exercise band, until the band stretches. 6. Pull the band backward. As you do this, bend your elbows and squeeze your shoulder blades together, but  avoid letting the rest of your body move. Do not let your shoulders lift up toward your ears. 7. Stop when your elbows are at your sides or slightly behind your body. 8. Hold for __________ seconds. 9. Slowly straighten your arms to return to the starting position. Repeat __________ times. Complete this exercise __________ times a day. Posture and body mechanics  Body mechanics refers to the movements and positions of your body while you do your daily activities. Posture is part of body mechanics. Good posture and healthy body mechanics can help to relieve stress in your body's tissues and joints. Good posture means that your spine is in its natural S-curve position (your spine is neutral), your shoulders are pulled back slightly, and your head is not tipped forward. The following are general guidelines for applying improved posture and body mechanics to your everyday activities. Standing   When standing, keep your spine neutral and your feet about hip-width apart. Keep a slight bend in your knees. Your ears, shoulders, and hips should line up.  When you do a task in which you lean forward while standing in one place for a long time, place one foot up on a stable object that is 2-4 inches (5-10 cm) high, such as a footstool. This helps keep your spine neutral. Sitting   When sitting, keep your spine neutral and keep your feet flat on the floor. Use a footrest, if necessary, and keep your thighs parallel to the floor. Avoid rounding your shoulders, and avoid tilting your head forward.  When working at a desk or a computer, keep your desk at a height where your hands are slightly lower than your elbows. Slide your chair under your desk so you are close enough to maintain good posture.  When working at a computer, place your monitor at a height where you are looking straight ahead and you do not have to tilt  your head forward or downward to look at the screen. Resting  When lying down and  resting, avoid positions that are most painful for you.  If you have pain with activities such as sitting, bending, stooping, or squatting (flexion-based activities), lie in a position in which your body does not bend very much. For example, avoid curling up on your side with your arms and knees near your chest (fetal position).  If you have pain with activities such as standing for a long time or reaching with your arms (extension-based activities), lie with your spine in a neutral position and bend your knees slightly. Try the following positions:  Lying on your side with a pillow between your knees.  Lying on your back with a pillow under your knees.  Lifting   When lifting objects, keep your feet at least shoulder-width apart and tighten your abdominal muscles.  Bend your knees and hips and keep your spine neutral. It is important to lift using the strength of your legs, not your back. Do not lock your knees straight out.  Always ask for help to lift heavy or awkward objects. This information is not intended to replace advice given to you by your health care provider. Make sure you discuss any questions you have with your health care provider. Document Released: 03/14/2005 Document Revised: 11/19/2015 Document Reviewed: 12/24/2014 Elsevier Interactive Patient Education  [employer].

## 2016-11-29 NOTE — Assessment & Plan Note (Signed)
New-onset spasm of the thoracic back muscle secondary to new activity of buffering. Treat conservatively with ice/moist heat and home exercise therapy. Start Zanaflex as needed for spasm. Follow-up if symptoms worsen or do not improve.

## 2016-12-15 ENCOUNTER — Telehealth: Payer: Self-pay | Admitting: Family

## 2016-12-15 NOTE — Telephone Encounter (Signed)
Pt called requesting a refill on sildenafil (REVATIO) 20 MG tablet to [facility] on [address]. He said that is was prescribed in the past and he would like to filled again if possible.

## 2016-12-16 MED ORDER — SILDENAFIL CITRATE 20 MG PO TABS: ORAL_TABLET | ORAL | 0 refills | Status: DC

## 2016-12-16 NOTE — Telephone Encounter (Signed)
Notified pt refill has been sent to [facility]...[name]

## 2016-12-16 NOTE — Telephone Encounter (Signed)
Medication sent to pharmacy  

## 2017-01-19 ENCOUNTER — Telehealth: Payer: Self-pay | Admitting: Family

## 2017-01-19 MED ORDER — SILDENAFIL CITRATE 20 MG PO TABS: ORAL_TABLET | ORAL | 0 refills | Status: DC

## 2017-01-19 NOTE — Telephone Encounter (Signed)
SENT 30 DAY SCRIPT PT NEED TO MAKE APPT W/NEW PROVIDER FOR FUTURE REFILLS

## 2017-01-19 NOTE — Telephone Encounter (Signed)
Pt called requesting a refill on sildenafil (REVATIO) 20 MG tablet to be sent to [facility] on [address].

## 2017-01-20 ENCOUNTER — Encounter: Payer: Self-pay | Admitting: Nurse Practitioner

## 2017-01-20 ENCOUNTER — Ambulatory Visit (INDEPENDENT_AMBULATORY_CARE_PROVIDER_SITE_OTHER): Payer: 59 | Admitting: Nurse Practitioner

## 2017-01-20 VITALS — BP 140/80 | HR 81 | Temp 98.4°F | Ht 67.0 in | Wt 206.0 lb

## 2017-01-20 DIAGNOSIS — M109 Gout, unspecified: Secondary | ICD-10-CM

## 2017-01-20 MED ORDER — INDOMETHACIN 50 MG PO CAPS: 50.0000 mg | ORAL_CAPSULE | Freq: Three times a day (TID) | ORAL | 0 refills | Status: DC | PRN

## 2017-01-20 NOTE — Progress Notes (Signed)
Subjective:  Patient ID: [name], male    DOB: 04-05-1965  Age: 51 y.o. MRN: [id number]  CC: Gout (gout?painful on left foot near ankle area. going on for 1 day)  Ankle Pain   The incident occurred 12 to 24 hours ago. There was no injury mechanism. The pain is present in the left ankle. The quality of the pain is described as aching and stabbing. The pain is at a severity of 10/10. The pain is severe. The pain has been constant since onset. Associated symptoms include an inability to bear weight and a loss of motion. Pertinent negatives include no loss of sensation, muscle weakness, numbness or tingling. He reports no foreign bodies present. The symptoms are aggravated by palpation, weight bearing and movement. He has tried NSAIDs for the symptoms. The treatment provided significant relief.  had similar symptoms 05/2016, diagnosed with acute gout, treated with indomethacin which resolved symptoms. Serum uric acid was normal. Admits to eating high amount of processed meats and drinks 2bottles of beer daily. Admits to not drinking adequate amounts of water.  Outpatient Medications Prior to Visit  Medication Sig Dispense Refill  . acetaminophen (TYLENOL) 500 MG tablet Take 500 mg by mouth every 6 (six) hours as needed.    . sildenafil (REVATIO) 20 MG tablet Take 1-5 tablets by mouth daily as needed. Must make appt w/new provider for future refills 50 tablet 0  . Aspirin-Acetaminophen (GOODYS BODY PAIN PO) Take 1 Package by mouth as needed.    Marland Kitchen tiZANidine (ZANAFLEX) 4 MG tablet Take 1 tablet (4 mg total) by mouth every 6 (six) hours as needed for muscle spasms. (Patient not taking: Reported on 01/20/2017) 40 tablet 0   Facility-Administered Medications Prior to Visit  Medication Dose Route Frequency Provider Last Rate Last Dose  . 0.9 %  sodium chloride infusion  500 mL Intravenous Continuous Milus Banister, MD        ROS See HPI  Objective:  BP 140/80   Pulse 81   Temp 98.4  F (36.9 C)   Ht 5\' 7"  (1.702 m)   Wt 206 lb (93.4 kg)   SpO2 99%   BMI 32.26 kg/m   BP Readings from Last 3 Encounters:  01/20/17 140/80  11/29/16 (!) 150/92  10/07/16 124/81    Wt Readings from Last 3 Encounters:  01/20/17 206 lb (93.4 kg)  11/29/16 203 lb 6.4 oz (92.3 kg)  10/07/16 206 lb (93.4 kg)    Physical Exam  Constitutional: He is oriented to person, place, and time.  Cardiovascular: Normal rate.   Pulmonary/Chest: Effort normal.  Musculoskeletal: He exhibits edema and tenderness. He exhibits no deformity.       Right ankle: Normal.       Left ankle: He exhibits decreased range of motion and swelling. He exhibits normal pulse. Tenderness. Lateral malleolus and AITFL tenderness found. No medial malleolus, no CF ligament, no posterior TFL, no head of 5th metatarsal and no proximal fibula tenderness found. Achilles tendon normal.       Right lower leg: Normal.       Left lower leg: Normal.       Right foot: Normal.       Left foot: Normal.  Neurological: He is alert and oriented to person, place, and time.  Skin: Skin is warm and dry. No rash noted. No erythema.  Vitals reviewed.   Lab Results  Component Value Date   WBC 6.6 06/15/2016   HGB 13.5 06/15/2016  HCT 41.2 06/15/2016   PLT 246.0 06/15/2016   GLUCOSE 97 06/15/2016   CHOL 270 (H) 11/17/2015   TRIG 68.0 11/17/2015   HDL 76.70 11/17/2015   LDLCALC 180 (H) 11/17/2015   ALT 18 06/15/2016   AST 25 06/15/2016   NA 139 06/15/2016   K 4.0 06/15/2016   CL 106 06/15/2016   CREATININE 0.98 06/15/2016   BUN 12 06/15/2016   CO2 26 06/15/2016   TSH 1.95 05/02/2016   PSA 2.58 11/17/2015   HGBA1C 6.1 05/02/2016    No results found.  Assessment & Plan:   [name] was seen today for gout.  Diagnoses and all orders for this visit:  Acute gout of left ankle, unspecified cause -     indomethacin (INDOCIN) 50 MG capsule; Take 1 capsule (50 mg total) by mouth 3 (three) times daily as needed.   I am  having [name] start on indomethacin. I am also having him maintain his acetaminophen, Aspirin-Acetaminophen (GOODYS BODY PAIN PO), tiZANidine, and sildenafil. We will continue to administer sodium chloride.  Meds ordered this encounter  Medications  . indomethacin (INDOCIN) 50 MG capsule    Sig: Take 1 capsule (50 mg total) by mouth 3 (three) times daily as needed.    Dispense:  30 capsule    Refill:  0    Order Specific Question:   Supervising Provider    Answer:   Cassandria Anger [[id number]]    Follow-up: Return if symptoms worsen or fail to improve.  [name] Lacy, NP

## 2017-01-20 NOTE — Patient Instructions (Signed)

## 2017-03-17 ENCOUNTER — Other Ambulatory Visit: Payer: Self-pay | Admitting: Nurse Practitioner

## 2017-03-17 NOTE — Telephone Encounter (Signed)
Copied from [place] [id number]. Topic: Quick Communication - Rx Refill/Question >> Mar 17, 2017 10:13 AM Patrice Paradise wrote: Has the patient contacted their pharmacy? Yes.   sildenafil (REVATIO) 20 MG tablet (PATIENT WAS INFORMED OF THIS Must make appt w/new provider for future refills)  (Agent: If no, request that the patient contact the pharmacy for the refill)  Preferred Pharmacy (with phone number or street name):  [facility] [address] [place] - [address] [place] [address] Phone: [phone] Fax: [phone]   Agent: Please be advised that RX refills may take up to 3 business days. We ask that you follow-up with your pharmacy.

## 2017-03-17 NOTE — Telephone Encounter (Signed)
Left message pt. Needs office visit for medication refills.

## 2019-10-02 ENCOUNTER — Encounter: Payer: Self-pay | Admitting: Family

## 2019-10-02 ENCOUNTER — Telehealth (INDEPENDENT_AMBULATORY_CARE_PROVIDER_SITE_OTHER): Payer: Self-pay | Admitting: Family

## 2019-10-02 VITALS — BP 142/92 | Wt 220.0 lb

## 2019-10-02 DIAGNOSIS — E785 Hyperlipidemia, unspecified: Secondary | ICD-10-CM | POA: Insufficient documentation

## 2019-10-02 DIAGNOSIS — I10 Essential (primary) hypertension: Secondary | ICD-10-CM | POA: Insufficient documentation

## 2019-10-02 DIAGNOSIS — J019 Acute sinusitis, unspecified: Secondary | ICD-10-CM

## 2019-10-02 MED ORDER — AMOXICILLIN-POT CLAVULANATE 875-125 MG PO TABS: 1.0000 | ORAL_TABLET | Freq: Two times a day (BID) | ORAL | 0 refills | Status: AC

## 2019-10-02 NOTE — Progress Notes (Addendum)
[name] is a 54 y.o. male with the following history as recorded in EpicCare:  Patient Active Problem List   Diagnosis Date Noted  . Spasm of thoracic back muscle 11/29/2016  . Skin tag 07/08/2016  . Acute gout of left ankle 06/15/2016  . Bronchitis due to chemical ([place]) 06/09/2016  . Hypersomnia with sleep apnea 06/09/2016  . Excessive daytime sleepiness 06/09/2016  . 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 Nightmares REM-sleep type 06/09/2016  . Upper respiratory infection 05/20/2016  . Fatigue 04/29/2016  . Erectile dysfunction due to diseases classified elsewhere 04/29/2016  . Acute upper respiratory infection 01/15/2016  . Sebaceous cyst 01/15/2016  . Routine general medical examination at a health care facility 11/17/2015  . Overweight (BMI 25.0-29.9) 11/17/2015    Current Outpatient Medications  Medication Sig Dispense Refill  . amlodipine-olmesartan (AZOR) 10-20 MG tablet Take 1 tablet by mouth daily.    Marland Kitchen azelastine (ASTELIN) 0.1 % nasal spray Place into both nostrils 2 (two) times daily. Use in each nostril as directed    . ibuprofen (ADVIL) 800 MG tablet Take 800 mg by mouth every 8 (eight) hours as needed.    . rosuvastatin (CRESTOR) 10 MG tablet Take 10 mg by mouth daily.    Marland Kitchen amoxicillin-clavulanate (AUGMENTIN) 875-125 MG tablet Take 1 tablet by mouth 2 (two) times daily for 10 days. 20 tablet 0   No current facility-administered medications for this visit.    Allergies: Doxycycline and Sildenafil  Past Medical History:  Diagnosis Date  . Allergy   . Arthritis   . Compartment syndrome (HCC)    right arm   . GERD (gastroesophageal reflux disease)     Past Surgical History:  Procedure Laterality Date  . CARPAL TUNNEL RELEASE Right 2014    Family History  Problem Relation Age of Onset  . Cataracts Mother   . Dementia Mother   . Hypertension Father   . Kidney disease Father   . Glaucoma Maternal Grandmother   . Hypertension Paternal Grandfather   .  Other Brother        dialysis  . Other Brother        dialysis  . Colon cancer Neg Hx   . Esophageal cancer Neg Hx   . Rectal cancer Neg Hx   . Stomach cancer Neg Hx     Social History   Tobacco Use  . Smoking status: Former Smoker    Types: Cigars    Quit date: 06/09/2012    Years since quitting: 7.3  . Smokeless tobacco: Never Used  . Tobacco comment: occasional cigar  Substance Use Topics  . Alcohol use: Yes    Alcohol/week: 2.0 standard drinks    Types: 2 Cans of beer per week    Subjective:   I connected with Kristine Royal on 10/02/19 at  9:40 AM EDT by a video enabled telemedicine application and verified that I am speaking with the correct person using two identifiers.   I discussed the limitations of evaluation and management by telemedicine and the availability of in person appointments. The patient expressed understanding and agreed to proceed. Provider in office/ patient is at home; provider and patient are only 2 people on video call.   Has not been seen here since October 2018; does have another PCP who has been managing his healthcare needs ( thinks they are now out of business)- now taking blood pressure and cholesterol medication; had labs done earlier this year;  Did have [place] in March 2021- planning  to get his vaccine later this month;  + sinus congestion- right is more problematic than left; no fever; no chest pain or shortness of breath;     Objective:  Vitals:   10/02/19 0953  BP: (!) 142/92  Weight: 220 lb (99.8 kg)    General: Well developed, well nourished, in no acute distress  Head: Normocephalic and atraumatic  Lungs: Respirations unlabored;  Neurologic: Alert and oriented; speech intact; face symmetrical;  Assessment:  1. Acute sinusitis, recurrence not specified, unspecified location   2. Essential hypertension   3. Hyperlipidemia, unspecified hyperlipidemia type     Plan:  1. Rx for Augmentin 875 mg bid x 10 days; continue using  Astelin; will need to be seen in office if symptoms persist. 2. Patient checked today while on call and was slightly elevated; he understands to follow-up if the pressure remains consistently above 140/90; 3. Tolerating Crestor well;  Spent 30 minutes on video visit with patient reviewing old records, updating his history/ current medical needs and discussing future treatment plans. He notes he wants to come back here for primary care needs and will follow-up for CPE early next year unless his blood pressure remains elevated; He is up to date on colonoscopy and plans to get his COVID vaccine later this month.   No follow-ups on file.  No orders of the defined types were placed in this encounter.   Requested Prescriptions   Signed Prescriptions Disp Refills  . amoxicillin-clavulanate (AUGMENTIN) 875-125 MG tablet 20 tablet 0    Sig: Take 1 tablet by mouth 2 (two) times daily for 10 days.

## 2019-11-15 ENCOUNTER — Ambulatory Visit (INDEPENDENT_AMBULATORY_CARE_PROVIDER_SITE_OTHER): Payer: 59 | Admitting: Family

## 2019-11-15 ENCOUNTER — Other Ambulatory Visit: Payer: Self-pay

## 2019-11-15 ENCOUNTER — Encounter: Payer: Self-pay | Admitting: Family

## 2019-11-15 VITALS — BP 110/60 | HR 80 | Temp 98.4°F | Ht 67.0 in | Wt 221.4 lb

## 2019-11-15 DIAGNOSIS — E785 Hyperlipidemia, unspecified: Secondary | ICD-10-CM | POA: Diagnosis not present

## 2019-11-15 DIAGNOSIS — M109 Gout, unspecified: Secondary | ICD-10-CM

## 2019-11-15 DIAGNOSIS — I10 Essential (primary) hypertension: Secondary | ICD-10-CM

## 2019-11-15 DIAGNOSIS — Z125 Encounter for screening for malignant neoplasm of prostate: Secondary | ICD-10-CM

## 2019-11-15 DIAGNOSIS — Z0000 Encounter for general adult medical examination without abnormal findings: Secondary | ICD-10-CM

## 2019-11-15 MED ORDER — AMLODIPINE-OLMESARTAN 10-20 MG PO TABS: 1.0000 | ORAL_TABLET | Freq: Every day | ORAL | 1 refills | Status: DC

## 2019-11-15 MED ORDER — IBUPROFEN 800 MG PO TABS: 800.0000 mg | ORAL_TABLET | Freq: Three times a day (TID) | ORAL | 0 refills | Status: DC | PRN

## 2019-11-15 MED ORDER — ROSUVASTATIN CALCIUM 10 MG PO TABS: 10.0000 mg | ORAL_TABLET | Freq: Every day | ORAL | 3 refills | Status: DC

## 2019-11-15 NOTE — Progress Notes (Signed)
[name] is a 54 y.o. male with the following history as recorded in EpicCare:  Patient Active Problem List   Diagnosis Date Noted  . Essential hypertension 10/02/2019  . Hyperlipidemia 10/02/2019  . Spasm of thoracic back muscle 11/29/2016  . Skin tag 07/08/2016  . Acute gout of left ankle 06/15/2016  . Bronchitis due to chemical ([place]) 06/09/2016  . Hypersomnia with sleep apnea 06/09/2016  . Excessive daytime sleepiness 06/09/2016  . 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 Nightmares REM-sleep type 06/09/2016  . Upper respiratory infection 05/20/2016  . Fatigue 04/29/2016  . Erectile dysfunction due to diseases classified elsewhere 04/29/2016  . Acute upper respiratory infection 01/15/2016  . Sebaceous cyst 01/15/2016  . Routine general medical examination at a health care facility 11/17/2015  . Overweight (BMI 25.0-29.9) 11/17/2015    Current Outpatient Medications  Medication Sig Dispense Refill  . amlodipine-olmesartan (AZOR) 10-20 MG tablet Take 1 tablet by mouth daily. 90 tablet 1  . azelastine (ASTELIN) 0.1 % nasal spray Place into both nostrils 2 (two) times daily. Use in each nostril as directed    . ibuprofen (ADVIL) 800 MG tablet Take 1 tablet (800 mg total) by mouth every 8 (eight) hours as needed. 60 tablet 0  . rosuvastatin (CRESTOR) 10 MG tablet Take 1 tablet (10 mg total) by mouth daily. 90 tablet 3   No current facility-administered medications for this visit.    Allergies: Doxycycline and Sildenafil  Past Medical History:  Diagnosis Date  . Allergy   . Arthritis   . Compartment syndrome (HCC)    right arm   . GERD (gastroesophageal reflux disease)     Past Surgical History:  Procedure Laterality Date  . CARPAL TUNNEL RELEASE Right 2014    Family History  Problem Relation Age of Onset  . Cataracts Mother   . Dementia Mother   . Hypertension Father   . Kidney disease Father   . Glaucoma Maternal Grandmother   . Hypertension Paternal Grandfather    . Other Brother        dialysis  . Other Brother        dialysis  . Colon cancer Neg Hx   . Esophageal cancer Neg Hx   . Rectal cancer Neg Hx   . Stomach cancer Neg Hx     Social History   Tobacco Use  . Smoking status: Former Smoker    Types: Cigars    Quit date: 06/09/2012    Years since quitting: 7.4  . Smokeless tobacco: Never Used  . Tobacco comment: occasional cigar  Substance Use Topics  . Alcohol use: Not Currently    Alcohol/week: 2.0 standard drinks    Types: 2 Cans of beer per week    Comment: stopped June2021    Subjective:  Patient has not been seen in the office since 2018; has been having primary care needs managed by work place employer but that office has since closed down; Requesting CPE today;  History of hypertension/ hyperlipidemia/ gout; also requesting refill on Ibuprofen for chronic right knee pain- is under the care of orthopedist/ considering replacement;  Had [place] in June 2021- planning for vaccine in September 2021;  Health Maintenance  Topic Date Due  . COVID-19 Vaccine (1) Never done  . HIV Screening  Never done  . INFLUENZA VACCINE  06/25/2020 (Originally 10/27/2019)  . COLONOSCOPY  10/07/2021  . TETANUS/TDAP  11/16/2025  . Hepatitis C Screening  Completed   Review of Systems  Constitutional: Negative.  HENT: Negative.   Eyes: Negative.   Respiratory: Negative.   Cardiovascular: Negative.   Gastrointestinal: Negative.   Genitourinary: Negative.   Musculoskeletal: Positive for joint pain.  Skin: Negative.   Neurological: Negative.   Endo/Heme/Allergies: Negative.   Psychiatric/Behavioral: Negative.       Objective:  Vitals:   11/15/19 1429  BP: 110/60  Pulse: 80  Temp: 98.4 F (36.9 C)  TempSrc: Oral  SpO2: 97%  Weight: 221 lb 6 oz (100.4 kg)  Height: _0  (1.702 m)    General: Well developed, well nourished, in no acute distress  Skin : Warm and dry.  Head: Normocephalic and atraumatic  Eyes: Sclera and  conjunctiva clear; pupils round and reactive to light; extraocular movements intact  Ears: External normal; canals clear; tympanic membranes normal  Oropharynx: Pink, supple. No suspicious lesions  Neck: Supple without thyromegaly, adenopathy  Lungs: Respirations unlabored; clear to auscultation bilaterally without wheeze, rales, rhonchi  CVS exam: normal rate and regular rhythm.  Abdomen: Soft; nontender; nondistended; normoactive bowel sounds; no masses or hepatosplenomegaly  Musculoskeletal: No deformities; no active joint inflammation  Extremities: No edema, cyanosis, clubbing  Vessels: Symmetric bilaterally  Neurologic: Alert and oriented; speech intact; face symmetrical; moves all extremities well; CNII-XII intact without focal deficit   Assessment:  1. PE (physical exam), annual   2. Essential hypertension   3. Hyperlipidemia, unspecified hyperlipidemia type   4. Gout, unspecified cause, unspecified chronicity, unspecified site   5. Prostate cancer screening     Plan:  Age appropriate preventive healthcare needs addressed; encouraged regular eye doctor and dental exams; encouraged regular exercise; will update labs and refills as needed today; follow-up to be determined;   No follow-ups on file.  Orders Placed This Encounter  Procedures  . CBC with Differential/Platelet    Standing Status:   Future    Standing Expiration Date:   11/14/2020  . Comp Met (CMET)    Standing Status:   Future    Standing Expiration Date:   11/14/2020  . Lipid panel    Standing Status:   Future    Standing Expiration Date:   11/14/2020  . Uric acid    Standing Status:   Future    Standing Expiration Date:   11/14/2020  . PSA    Standing Status:   Future    Standing Expiration Date:   11/14/2020    Requested Prescriptions   Signed Prescriptions Disp Refills  . amlodipine-olmesartan (AZOR) 10-20 MG tablet 90 tablet 1    Sig: Take 1 tablet by mouth daily.  . rosuvastatin (CRESTOR) 10 MG tablet  90 tablet 3    Sig: Take 1 tablet (10 mg total) by mouth daily.  Marland Kitchen ibuprofen (ADVIL) 800 MG tablet 60 tablet 0    Sig: Take 1 tablet (800 mg total) by mouth every 8 (eight) hours as needed.

## 2019-11-15 NOTE — Addendum Note (Signed)
Addended by: Cresenciano Lick on: 11/15/2019 02:56 PM   Modules accepted: Orders

## 2019-11-16 LAB — LIPID PANEL
Cholesterol: 116 mg/dL (ref <200)
HDL: 37 mg/dL — ABNORMAL LOW (ref >=40)
LDL Cholesterol (Calc): 60 mg/dL (calc)
Non-HDL Cholesterol (Calc): 79 mg/dL (calc) (ref <130)
Total CHOL/HDL Ratio: 3.1 (calc) (ref <5.0)
Triglycerides: 111 mg/dL (ref <150)

## 2019-11-16 LAB — CBC WITH DIFFERENTIAL/PLATELET
Absolute Monocytes: 626 cells/uL (ref 200–950)
Basophils Absolute: 27 cells/uL (ref 0–200)
Basophils Relative: 0.4 %
Eosinophils Absolute: 170 cells/uL (ref 15–500)
Eosinophils Relative: 2.5 %
HCT: 37.6 % — ABNORMAL LOW (ref 38.5–50.0)
Hemoglobin: 12.6 g/dL — ABNORMAL LOW (ref 13.2–17.1)
Lymphs Abs: 1795 cells/uL (ref 850–3900)
MCH: 27.6 pg (ref 27.0–33.0)
MCHC: 33.5 g/dL (ref 32.0–36.0)
MCV: 82.3 fL (ref 80.0–100.0)
MPV: 11.2 fL (ref 7.5–12.5)
Monocytes Relative: 9.2 %
Neutro Abs: 4182 cells/uL (ref 1500–7800)
Neutrophils Relative %: 61.5 %
Platelets: 301 10*3/uL (ref 140–400)
RBC: 4.57 10*6/uL (ref 4.20–5.80)
RDW: 13.1 % (ref 11.0–15.0)
Total Lymphocyte: 26.4 %
WBC: 6.8 10*3/uL (ref 3.8–10.8)

## 2019-11-16 LAB — COMPREHENSIVE METABOLIC PANEL
AG Ratio: 1.4 (calc) (ref 1.0–2.5)
ALT: 28 U/L (ref 9–46)
AST: 29 U/L (ref 10–35)
Albumin: 4.3 g/dL (ref 3.6–5.1)
Alkaline phosphatase (APISO): 63 U/L (ref 35–144)
BUN: 11 mg/dL (ref 7–25)
CO2: 27 mmol/L (ref 20–32)
Calcium: 9.5 mg/dL (ref 8.6–10.3)
Chloride: 101 mmol/L (ref 98–110)
Creat: 0.82 mg/dL (ref 0.70–1.33)
Globulin: 3 g/dL (calc) (ref 1.9–3.7)
Glucose, Bld: 91 mg/dL (ref 65–99)
Potassium: 4.3 mmol/L (ref 3.5–5.3)
Sodium: 137 mmol/L (ref 135–146)
Total Bilirubin: 0.4 mg/dL (ref 0.2–1.2)
Total Protein: 7.3 g/dL (ref 6.1–8.1)

## 2019-11-16 LAB — URIC ACID: Uric Acid, Serum: 5.9 mg/dL (ref 4.0–8.0)

## 2019-11-16 LAB — PSA: PSA: 1.1 ng/mL (ref <=4.0)

## 2019-11-25 ENCOUNTER — Ambulatory Visit: Payer: 59 | Admitting: Family

## 2019-12-26 ENCOUNTER — Other Ambulatory Visit: Payer: Self-pay | Admitting: Family

## 2020-01-06 ENCOUNTER — Telehealth: Payer: Self-pay | Admitting: Family

## 2020-01-06 MED ORDER — AMLODIPINE BESYLATE-VALSARTAN 10-160 MG PO TABS: 1.0000 | ORAL_TABLET | Freq: Every day | ORAL | 0 refills | Status: DC

## 2020-01-06 NOTE — Telephone Encounter (Signed)
Please let him know that his insurance company will not cover his Azor; they want [place] to use Exforge as alternative which is a good medication too. I will call in new medication and he needs OV in about 1 month to see his response.

## 2020-01-10 NOTE — Telephone Encounter (Signed)
Please see below; I had accidentally closed encounter earlier this week.

## 2020-01-10 NOTE — Telephone Encounter (Signed)
Called pt there was no answer LMOM w/[name] response.Marland KitchenJohny [name]

## 2020-01-15 ENCOUNTER — Ambulatory Visit (INDEPENDENT_AMBULATORY_CARE_PROVIDER_SITE_OTHER): Payer: 59 | Admitting: Family

## 2020-01-15 ENCOUNTER — Other Ambulatory Visit: Payer: Self-pay

## 2020-01-15 ENCOUNTER — Encounter: Payer: Self-pay | Admitting: Family

## 2020-01-15 VITALS — BP 136/84 | HR 74 | Temp 98.6°F | Ht 67.0 in | Wt 217.8 lb

## 2020-01-15 DIAGNOSIS — I10 Essential (primary) hypertension: Secondary | ICD-10-CM

## 2020-01-15 DIAGNOSIS — R5383 Other fatigue: Secondary | ICD-10-CM

## 2020-01-15 DIAGNOSIS — D649 Anemia, unspecified: Secondary | ICD-10-CM | POA: Diagnosis not present

## 2020-01-15 LAB — COMPREHENSIVE METABOLIC PANEL
ALT: 22 U/L (ref 0–53)
AST: 57 U/L — ABNORMAL HIGH (ref 0–37)
Albumin: 4.5 g/dL (ref 3.5–5.2)
Alkaline Phosphatase: 53 U/L (ref 39–117)
BUN: 11 mg/dL (ref 6–23)
CO2: 27 mEq/L (ref 19–32)
Calcium: 9.6 mg/dL (ref 8.4–10.5)
Chloride: 100 mEq/L (ref 96–112)
Creatinine, Ser: 0.87 mg/dL (ref 0.40–1.50)
GFR: 96.19 mL/min (ref >60.00)
Glucose, Bld: 98 mg/dL (ref 70–99)
Potassium: 3.5 mEq/L (ref 3.5–5.1)
Sodium: 138 mEq/L (ref 135–145)
Total Bilirubin: 0.7 mg/dL (ref 0.2–1.2)
Total Protein: 7.8 g/dL (ref 6.0–8.3)

## 2020-01-15 LAB — CBC WITH DIFFERENTIAL/PLATELET
Basophils Absolute: 0 10*3/uL (ref 0.0–0.1)
Basophils Relative: 0.7 % (ref 0.0–3.0)
Eosinophils Absolute: 0.2 10*3/uL (ref 0.0–0.7)
Eosinophils Relative: 3.4 % (ref 0.0–5.0)
HCT: 37.4 % — ABNORMAL LOW (ref 39.0–52.0)
Hemoglobin: 12.4 g/dL — ABNORMAL LOW (ref 13.0–17.0)
Lymphocytes Relative: 31.2 % (ref 12.0–46.0)
Lymphs Abs: 1.5 10*3/uL (ref 0.7–4.0)
MCHC: 33.2 g/dL (ref 30.0–36.0)
MCV: 82.6 fl (ref 78.0–100.0)
Monocytes Absolute: 0.4 10*3/uL (ref 0.1–1.0)
Monocytes Relative: 8.7 % (ref 3.0–12.0)
Neutro Abs: 2.8 10*3/uL (ref 1.4–7.7)
Neutrophils Relative %: 56 % (ref 43.0–77.0)
Platelets: 257 10*3/uL (ref 150.0–400.0)
RBC: 4.53 Mil/uL (ref 4.22–5.81)
RDW: 15.9 % — ABNORMAL HIGH (ref 11.5–15.5)
WBC: 5 10*3/uL (ref 4.0–10.5)

## 2020-01-15 LAB — FERRITIN: Ferritin: 429 ng/mL — ABNORMAL HIGH (ref 22.0–322.0)

## 2020-01-15 MED ORDER — AMLODIPINE-OLMESARTAN 10-20 MG PO TABS: 1.0000 | ORAL_TABLET | Freq: Every day | ORAL | 1 refills | Status: DC

## 2020-01-15 NOTE — Progress Notes (Signed)
[name] is a 54 y.o. male with the following history as recorded in EpicCare:  Patient Active Problem List   Diagnosis Date Noted  . Essential hypertension 10/02/2019  . Hyperlipidemia 10/02/2019  . Spasm of thoracic back muscle 11/29/2016  . Skin tag 07/08/2016  . Acute gout of left ankle 06/15/2016  . Bronchitis due to chemical ([place]) 06/09/2016  . Hypersomnia with sleep apnea 06/09/2016  . Excessive daytime sleepiness 06/09/2016  . 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 Nightmares REM-sleep type 06/09/2016  . Upper respiratory infection 05/20/2016  . Fatigue 04/29/2016  . Erectile dysfunction due to diseases classified elsewhere 04/29/2016  . Acute upper respiratory infection 01/15/2016  . Sebaceous cyst 01/15/2016  . Routine general medical examination at a health care facility 11/17/2015  . Overweight (BMI 25.0-29.9) 11/17/2015    Current Outpatient Medications  Medication Sig Dispense Refill  . ibuprofen (ADVIL) 800 MG tablet TAKE 1 TABLET BY MOUTH EVERY 8 HOURS AS NEEDED 60 tablet 0  . rosuvastatin (CRESTOR) 10 MG tablet Take 1 tablet (10 mg total) by mouth daily. 90 tablet 3  . amlodipine-olmesartan (AZOR) 10-20 MG tablet Take 1 tablet by mouth daily. 90 tablet 1  . azelastine (ASTELIN) 0.1 % nasal spray Place into both nostrils 2 (two) times daily. Use in each nostril as directed (Patient not taking: Reported on 01/15/2020)     No current facility-administered medications for this visit.    Allergies: Doxycycline and Sildenafil  Past Medical History:  Diagnosis Date  . Allergy   . Arthritis   . Compartment syndrome (HCC)    right arm   . GERD (gastroesophageal reflux disease)     Past Surgical History:  Procedure Laterality Date  . CARPAL TUNNEL RELEASE Right 2014    Family History  Problem Relation Age of Onset  . Cataracts Mother   . Dementia Mother   . Hypertension Father   . Kidney disease Father   . Glaucoma Maternal Grandmother   . Hypertension  Paternal Grandfather   . Other Brother        dialysis  . Other Brother        dialysis  . Colon cancer Neg Hx   . Esophageal cancer Neg Hx   . Rectal cancer Neg Hx   . Stomach cancer Neg Hx     Social History   Tobacco Use  . Smoking status: Former Smoker    Types: Cigars    Quit date: 06/09/2012    Years since quitting: 7.6  . Smokeless tobacco: Never Used  . Tobacco comment: occasional cigar  Substance Use Topics  . Alcohol use: Not Currently    Alcohol/week: 2.0 standard drinks    Types: 2 Cans of beer per week    Comment: stopped June2021    Subjective:  Patient would like to discuss how bad he has felt on Exforge; had done very well on Azor and was forced to change to Exforge by his [employer]; notes that ever since making the change, he has felt very sluggish/ had swelling in the ankles and blood pressure is not being controlled for a full 24 hours; also notes that his hands have become more shaky/ very jittery since changing blood pressure medication;  Had previously been on Azor with no complications for at least 2-3 years;      Objective:  Vitals:   01/15/20 1153  BP: 136/84  Pulse: 74  Temp: 98.6 F (37 C)  TempSrc: Oral  SpO2: 98%  Weight: 217  lb 12.8 oz (98.8 kg)  Height: '5\' 7"'  (1.702 m)    General: Well developed, well nourished, in no acute distress  Skin : Warm and dry.  Head: Normocephalic and atraumatic  Eyes: Sclera and conjunctiva clear; pupils round and reactive to light; extraocular movements intact  Ears: External normal; canals clear; tympanic membranes normal  Oropharynx: Pink, supple. No suspicious lesions  Neck: Supple without thyromegaly, adenopathy  Lungs: Respirations unlabored; clear to auscultation bilaterally without wheeze, rales, rhonchi  CVS exam: normal rate and regular rhythm.  Neurologic: Alert and oriented; speech intact; face symmetrical; moves all extremities well; CNII-XII intact without focal deficit    Assessment:  1. Other fatigue   2. Anemia, unspecified type   3. Essential hypertension     Plan:  Appears to be doing very poorly on Exforge; will change back to Azor which he has tolerated very well in the past; ( Good Rx information provided as alternative if his insurance will not cover); Will check labs to make sure no other source for his symptoms;  He defers any type of vaccine;  This visit occurred during the SARS-CoV-2 public health emergency.  Safety protocols were in place, including screening questions prior to the visit, additional usage of staff PPE, and extensive cleaning of exam room while observing appropriate contact time as indicated for disinfecting solutions.     No follow-ups on file.  Orders Placed This Encounter  Procedures  . CBC with Differential/Platelet    Standing Status:   Future    Number of Occurrences:   1    Standing Expiration Date:   01/14/2021  . Comp Met (CMET)    Standing Status:   Future    Number of Occurrences:   1    Standing Expiration Date:   01/14/2021  . Ferritin    Standing Status:   Future    Number of Occurrences:   1    Standing Expiration Date:   01/14/2021    Requested Prescriptions   Signed Prescriptions Disp Refills  . amlodipine-olmesartan (AZOR) 10-20 MG tablet 90 tablet 1    Sig: Take 1 tablet by mouth daily.

## 2020-01-22 ENCOUNTER — Other Ambulatory Visit: Payer: Self-pay | Admitting: Family

## 2020-01-22 DIAGNOSIS — R7989 Other specified abnormal findings of blood chemistry: Secondary | ICD-10-CM

## 2020-01-23 ENCOUNTER — Telehealth: Payer: Self-pay | Admitting: Hematology and Oncology

## 2020-01-23 ENCOUNTER — Telehealth: Payer: Self-pay | Admitting: Family

## 2020-01-23 NOTE — Telephone Encounter (Signed)
LVM to call back for lab results.

## 2020-01-23 NOTE — Telephone Encounter (Signed)
° ° °  Please return call to patient to discuss lab results °

## 2020-01-23 NOTE — Telephone Encounter (Signed)
Received a new hem referral from Jodi Mourning for elevated ferritin on 11/22 at 3pm. Pt aware to arrive 15 minutes early. Letter mailed.

## 2020-02-16 NOTE — Progress Notes (Signed)
[facility] NOTE  Patient Care Team: Marrian Salvage, FNP as PCP - General (Internal Medicine)  CHIEF COMPLAINTS/PURPOSE OF CONSULTATION:  Newly diagnosed elevated ferritin  HISTORY OF PRESENTING ILLNESS:  [name] 54 y.o. male is here because of recent diagnosis of elevated ferritin. Labs on 01/15/20 showed Hg 12.4, HCT 37.4, ferritin 429. He presents to the clinic today for initial evaluation.   I reviewed his records extensively and collaborated the history with the patient.   MEDICAL HISTORY:  Past Medical History:  Diagnosis Date  . Allergy   . Arthritis   . Compartment syndrome (HCC)    right arm   . GERD (gastroesophageal reflux disease)     SURGICAL HISTORY: Past Surgical History:  Procedure Laterality Date  . CARPAL TUNNEL RELEASE Right 2014    SOCIAL HISTORY: Social History   Socioeconomic History  . Marital status: Married    Spouse name: Kendrick Fries  . Number of children: 2  . Years of education: 94  . Highest education level: Not on file  Occupational History  . Occupation: Patent attorney  Tobacco Use  . Smoking status: Former Smoker    Types: Cigars    Quit date: 06/09/2012    Years since quitting: 7.6  . Smokeless tobacco: Never Used  . Tobacco comment: occasional cigar  Vaping Use  . Vaping Use: Never used  Substance and Sexual Activity  . Alcohol use: Not Currently    Alcohol/week: 2.0 standard drinks    Types: 2 Cans of beer per week    Comment: stopped June2021  . Drug use: No  . Sexual activity: Not on file  Other Topics Concern  . Not on file  Social History Narrative   Fun: [name]   Lives at home with wife   Caffeine - coffee, one daily   Social Determinants of Health   Financial Resource Strain:   . Difficulty of Paying Living Expenses: Not on file  Food Insecurity:   . Worried About Charity fundraiser in the Last Year: Not on file  . Ran Out of Food in the Last Year: Not on file    Transportation Needs:   . Lack of Transportation (Medical): Not on file  . Lack of Transportation (Non-Medical): Not on file  Physical Activity:   . Days of Exercise per Week: Not on file  . Minutes of Exercise per Session: Not on file  Stress:   . Feeling of Stress : Not on file  Social Connections:   . Frequency of Communication with Friends and Family: Not on file  . Frequency of Social Gatherings with Friends and Family: Not on file  . Attends Religious Services: Not on file  . Active Member of Clubs or Organizations: Not on file  . Attends Archivist Meetings: Not on file  . Marital Status: Not on file  Intimate Partner Violence:   . Fear of Current or Ex-Partner: Not on file  . Emotionally Abused: Not on file  . Physically Abused: Not on file  . Sexually Abused: Not on file    FAMILY HISTORY: Family History  Problem Relation Age of Onset  . Cataracts Mother   . Dementia Mother   . Hypertension Father   . Kidney disease Father   . Glaucoma Maternal Grandmother   . Hypertension Paternal Grandfather   . Other Brother        dialysis  . Other Brother        dialysis  .  Colon cancer Neg Hx   . Esophageal cancer Neg Hx   . Rectal cancer Neg Hx   . Stomach cancer Neg Hx     ALLERGIES:  is allergic to doxycycline and sildenafil.  MEDICATIONS:  Current Outpatient Medications  Medication Sig Dispense Refill  . amlodipine-olmesartan (AZOR) 10-20 MG tablet Take 1 tablet by mouth daily. 90 tablet 1  . azelastine (ASTELIN) 0.1 % nasal spray Place into both nostrils 2 (two) times daily. Use in each nostril as directed (Patient not taking: Reported on 01/15/2020)    . ibuprofen (ADVIL) 800 MG tablet TAKE 1 TABLET BY MOUTH EVERY 8 HOURS AS NEEDED 60 tablet 0  . rosuvastatin (CRESTOR) 10 MG tablet Take 1 tablet (10 mg total) by mouth daily. 90 tablet 3   No current facility-administered medications for this visit.    REVIEW OF SYSTEMS:   Constitutional: Denies  fevers, chills or abnormal night sweats Eyes: Denies blurriness of vision, double vision or watery eyes Ears, nose, mouth, throat, and face: Denies mucositis or sore throat Respiratory: Denies cough, dyspnea or wheezes Cardiovascular: Denies palpitation, chest discomfort or lower extremity swelling Gastrointestinal:  Denies nausea, heartburn or change in bowel habits Skin: Denies abnormal skin rashes Lymphatics: Denies new lymphadenopathy or easy bruising Neurological:Denies numbness, tingling or new weaknesses Behavioral/Psych: Mood is stable, no new changes  All other systems were reviewed with the patient and are negative.  PHYSICAL EXAMINATION: ECOG PERFORMANCE STATUS: 1 - Symptomatic but completely ambulatory  Vitals:   02/17/20 1513  BP: (!) 149/77  Pulse: 93  Resp: 18  Temp: 99.1 F (37.3 C)  SpO2: 100%   Filed Weights   02/17/20 1513  Weight: 224 lb 12.8 oz (102 kg)    GENERAL:alert, no distress and comfortable SKIN: skin color, texture, turgor are normal, no rashes or significant lesions EYES: normal, conjunctiva are pink and non-injected, sclera clear OROPHARYNX:no exudate, no erythema and lips, buccal mucosa, and tongue normal  NECK: supple, thyroid normal size, non-tender, without nodularity LYMPH:  no palpable lymphadenopathy in the cervical, axillary or inguinal LUNGS: clear to auscultation and percussion with normal breathing effort HEART: regular rate & rhythm and no murmurs and no lower extremity edema ABDOMEN:abdomen soft, non-tender and normal bowel sounds Musculoskeletal:no cyanosis of digits and no clubbing  PSYCH: alert & oriented x 3 with fluent speech NEURO: no focal motor/sensory deficits  LABORATORY DATA:  I have reviewed the data as listed Lab Results  Component Value Date   WBC 5.0 01/15/2020   HGB 12.4 (L) 01/15/2020   HCT 37.4 (L) 01/15/2020   MCV 82.6 01/15/2020   PLT 257.0 01/15/2020   Lab Results  Component Value Date   NA 138  01/15/2020   K 3.5 01/15/2020   CL 100 01/15/2020   CO2 27 01/15/2020    RADIOGRAPHIC STUDIES: I have personally reviewed the radiological reports and agreed with the findings in the report.  ASSESSMENT AND PLAN:  Elevated ferritin 01/15/20: Ferritin 429 (done because his AST was 57), Hb 12.4, MCV 82.6  Counseling: I discussed with the patient the entire metaboilsm of iron.  We discussed the clinical symptoms of hemochromatosis including elevation of liver function tests as well as skin hyperpigmentation, diabetes, arthralgias and sometimes EKG abnormalities.We also discussed that ferritin most often is an acute phase reactant secondary to inflammation.   Diagnosis: Hemochromatosis gene testing, Complete Iron panel, Liver Ultrasound (to rule of NASH) MyChart virtual visit in 2 weeks to discuss results of these tests.  All  questions were answered. The patient knows to call the clinic with any problems, questions or concerns.   Rulon Eisenmenger, MD, MPH 02/17/2020    I, Molly Dorshimer, am acting as scribe for Nicholas Lose, MD.  I have reviewed the above documentation for accuracy and completeness, and I agree with the above.

## 2020-02-17 ENCOUNTER — Telehealth: Payer: Self-pay | Admitting: Hematology and Oncology

## 2020-02-17 ENCOUNTER — Other Ambulatory Visit: Payer: Self-pay

## 2020-02-17 ENCOUNTER — Inpatient Hospital Stay: Payer: 59 | Attending: Hematology and Oncology | Admitting: Hematology and Oncology

## 2020-02-17 ENCOUNTER — Inpatient Hospital Stay: Payer: 59

## 2020-02-17 DIAGNOSIS — R7989 Other specified abnormal findings of blood chemistry: Secondary | ICD-10-CM

## 2020-02-17 LAB — CBC WITH DIFFERENTIAL (CANCER CENTER ONLY)
Abs Immature Granulocytes: 0.01 10*3/uL (ref 0.00–0.07)
Basophils Absolute: 0 10*3/uL (ref 0.0–0.1)
Basophils Relative: 1 %
Eosinophils Absolute: 0.1 10*3/uL (ref 0.0–0.5)
Eosinophils Relative: 1 %
HCT: 39.1 % (ref 39.0–52.0)
Hemoglobin: 12.9 g/dL — ABNORMAL LOW (ref 13.0–17.0)
Immature Granulocytes: 0 %
Lymphocytes Relative: 31 %
Lymphs Abs: 1.8 10*3/uL (ref 0.7–4.0)
MCH: 27.2 pg (ref 26.0–34.0)
MCHC: 33 g/dL (ref 30.0–36.0)
MCV: 82.5 fL (ref 80.0–100.0)
Monocytes Absolute: 0.5 10*3/uL (ref 0.1–1.0)
Monocytes Relative: 8 %
Neutro Abs: 3.6 10*3/uL (ref 1.7–7.7)
Neutrophils Relative %: 59 %
Platelet Count: 289 10*3/uL (ref 150–400)
RBC: 4.74 MIL/uL (ref 4.22–5.81)
RDW: 14.6 % (ref 11.5–15.5)
WBC Count: 6 10*3/uL (ref 4.0–10.5)
nRBC: 0 % (ref 0.0–0.2)

## 2020-02-17 LAB — CMP (CANCER CENTER ONLY)
ALT: 19 U/L (ref 0–44)
AST: 27 U/L (ref 15–41)
Albumin: 4.2 g/dL (ref 3.5–5.0)
Alkaline Phosphatase: 64 U/L (ref 38–126)
Anion gap: 10 (ref 5–15)
BUN: 12 mg/dL (ref 6–20)
CO2: 25 mmol/L (ref 22–32)
Calcium: 9.5 mg/dL (ref 8.9–10.3)
Chloride: 102 mmol/L (ref 98–111)
Creatinine: 0.94 mg/dL (ref 0.61–1.24)
GFR, Estimated: >60 mL/min (ref >60)
Glucose, Bld: 99 mg/dL (ref 70–99)
Potassium: 3.3 mmol/L — ABNORMAL LOW (ref 3.5–5.1)
Sodium: 137 mmol/L (ref 135–145)
Total Bilirubin: 0.4 mg/dL (ref 0.3–1.2)
Total Protein: 8.5 g/dL — ABNORMAL HIGH (ref 6.5–8.1)

## 2020-02-17 NOTE — Assessment & Plan Note (Signed)
01/15/20: Ferritin 429 (done because his AST was 57), Hb 12.4, MCV 82.6  Counseling: I discussed with the patient the entire metaboilsm of iron.  We discussed the clinical symptoms of hemochromatosis including elevation of liver function tests as well as skin hyperpigmentation, diabetes, arthralgias and sometimes EKG abnormalities.We also discussed that ferritin most often is an acute phase reactant secondary to inflammation.   Diagnosis: Hemochromatosis gene testing, Complete Iron panel, Liver Ultrasound (to rule of NASH)

## 2020-02-17 NOTE — Telephone Encounter (Signed)
Scheduled appts per 11/22 los. Gave pt a print out of AVS.  

## 2020-02-18 LAB — FERRITIN: Ferritin: 270 ng/mL (ref 24–336)

## 2020-02-18 LAB — IRON AND TIBC
Iron: 65 ug/dL (ref 42–163)
Saturation Ratios: 17 % — ABNORMAL LOW (ref 20–55)
TIBC: 371 ug/dL (ref 202–409)
UIBC: 306 ug/dL (ref 117–376)

## 2020-02-19 ENCOUNTER — Ambulatory Visit: Payer: 59

## 2020-02-24 LAB — HEMOCHROMATOSIS DNA-PCR(C282Y,H63D)

## 2020-02-26 ENCOUNTER — Ambulatory Visit (HOSPITAL_COMMUNITY): Admission: RE | Admit: 2020-02-26 | Payer: 59 | Source: Ambulatory Visit

## 2020-03-01 NOTE — Progress Notes (Signed)
  HEMATOLOGY-ONCOLOGY [facility] telephone VISIT PROGRESS NOTE  I connected with [name] on 03/02/2020 at  3:00 PM EST by MyChart video conference and verified that I am speaking with the correct person using two identifiers.  I discussed the limitations, risks, security and privacy concerns of performing an evaluation and management service by MyChart and the availability of in person appointments.  I also discussed with the patient that there may be a patient responsible charge related to this service. The patient expressed understanding and agreed to proceed.  Patient's Location: Home Physician Location: Clinic  CHIEF COMPLIANT: Follow-up of elevated ferritin  INTERVAL HISTORY: [name] is a 53 y.o. male with above-mentioned history of elevated ferritin. Labs on 02/16/20 showed Hg 12.9, HCT 39.1, iron saturation 17%, ferritin 270. He presents over telephone today for follow-up.    Observations/Objective:  There were no vitals filed for this visit. There is no height or weight on file to calculate BMI.  I have reviewed the data as listed CMP Latest Ref Rng & Units 02/17/2020 01/15/2020 11/15/2019  Glucose 70 - 99 mg/dL 99 98 91  BUN 6 - 20 mg/dL 12 11 11   Creatinine 0.61 - 1.24 mg/dL 0.94 0.87 0.82  Sodium 135 - 145 mmol/L 137 138 137  Potassium 3.5 - 5.1 mmol/L 3.3(L) 3.5 4.3  Chloride 98 - 111 mmol/L 102 100 101  CO2 22 - 32 mmol/L 25 27 27   Calcium 8.9 - 10.3 mg/dL 9.5 9.6 9.5  Total Protein 6.5 - 8.1 g/dL 8.5(H) 7.8 7.3  Total Bilirubin 0.3 - 1.2 mg/dL 0.4 0.7 0.4  Alkaline Phos 38 - 126 U/L 64 53 -  AST 15 - 41 U/L 27 57(H) 29  ALT 0 - 44 U/L 19 22 28     Lab Results  Component Value Date   WBC 6.0 02/17/2020   HGB 12.9 (L) 02/17/2020   HCT 39.1 02/17/2020   MCV 82.5 02/17/2020   PLT 289 02/17/2020   NEUTROABS 3.6 02/17/2020      Assessment Plan:  Elevated ferritin 01/15/20: Ferritin 429 (done because his AST was 57), Hb 12.4, MCV 82.6 02/18/2020:  Hemochromatosis gene testing: No mutations identified 02/18/2020: Iron saturation 17%, ferritin 270, hemoglobin 12.9  I discussed with the patient with the cause of the elevated ferritin in October was more inflammatory in nature.  Is an acute phase reactant.  (Could be due to severe arthritis in his knees) patient does not have to be concerned about iron overload. Return to clinic on as-needed basis.    I discussed the assessment and treatment plan with the patient. The patient was provided an opportunity to ask questions and all were answered. The patient agreed with the plan and demonstrated an understanding of the instructions. The patient was advised to call back or seek an in-person evaluation if the symptoms worsen or if the condition fails to improve as anticipated.   I provided 11 minutes of face-to-face MyChart video visit time during this encounter.    Rulon Eisenmenger, MD 03/02/2020   I, Molly Dorshimer, am acting as scribe for Nicholas Lose, MD.  I have reviewed the above documentation for accuracy and completeness, and I agree with the above.

## 2020-03-02 ENCOUNTER — Inpatient Hospital Stay: Payer: 59 | Attending: Hematology and Oncology | Admitting: Hematology and Oncology

## 2020-03-02 DIAGNOSIS — R7989 Other specified abnormal findings of blood chemistry: Secondary | ICD-10-CM

## 2020-03-02 NOTE — Assessment & Plan Note (Signed)
01/15/20: Ferritin 429 (done because his AST was 57), Hb 12.4, MCV 82.6 02/18/2020: Hemochromatosis gene testing: No mutations identified 02/18/2020: Iron saturation 17%, ferritin 270, hemoglobin 12.9  I discussed with the patient with the cause of the elevated ferritin in October was more inflammatory in nature.  Is an acute phase reactant.  Patient does not have to be concerned about iron overload. Return to clinic on as-needed basis.

## 2020-03-28 ENCOUNTER — Other Ambulatory Visit: Payer: Self-pay | Admitting: Family

## 2020-04-15 ENCOUNTER — Other Ambulatory Visit: Payer: Self-pay | Admitting: Family

## 2020-04-16 ENCOUNTER — Ambulatory Visit: Payer: 59 | Admitting: Family Medicine

## 2020-04-21 NOTE — Progress Notes (Deleted)
   I, Peterson Lombard, LAT, ATC acting as a scribe for Lynne Leader, MD.  Subjective:    CC: Upper back pain  HPI: Pt is a 55 y/o male c/o upper back pain that radiates under R arm ongoing for //. 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 Pt locates pain //  Mechanical symptoms: Radiates: UE numbness/tingling: UE weakness: Aggravates: Rx tried:  Pertinent review of Systems: ***  Relevant historical information: ***   Objective:   There were no vitals filed for this visit. General: Well Developed, well nourished, and in no acute distress.   MSK: ***  Lab and Radiology Results No results found for this or any previous visit (from the past 72 hour(s)). No results found.    Impression and Recommendations:    Assessment and Plan: 55 y.o. male with ***.  PDMP not reviewed this encounter. No orders of the defined types were placed in this encounter.  No orders of the defined types were placed in this encounter.   Discussed warning signs or symptoms. Please see discharge instructions. Patient expresses understanding.   ***

## 2020-04-22 ENCOUNTER — Ambulatory Visit: Payer: Self-pay | Admitting: Family Medicine

## 2020-04-29 ENCOUNTER — Other Ambulatory Visit: Payer: Self-pay | Admitting: Family

## 2020-04-30 ENCOUNTER — Telehealth: Payer: Self-pay | Admitting: Family

## 2020-04-30 NOTE — Telephone Encounter (Signed)
amlodipine-olmesartan (AZOR) 10-20 MG tablet Patient states he needs a PA for this medication

## 2020-05-01 ENCOUNTER — Other Ambulatory Visit: Payer: Self-pay | Admitting: Family

## 2020-05-01 NOTE — Telephone Encounter (Signed)
Yes, please do PA; he has tried formulary alternatives and could not tolerate; Tried Exforge in October ( Amlodipine/ Valsartan); see note from 01/15/20 to send to insurance if needed.

## 2020-05-01 NOTE — Telephone Encounter (Signed)
Pt states his insurance has changed; now has Edgar (see in media tab 11/15/19).  PA started: [id number] Paper copy efaxed to insurance company

## 2020-05-02 ENCOUNTER — Other Ambulatory Visit: Payer: Self-pay | Admitting: Family

## 2020-05-04 ENCOUNTER — Other Ambulatory Visit: Payer: Self-pay | Admitting: Family

## 2020-05-04 MED ORDER — AMLODIPINE BESYLATE 10 MG PO TABS: 10.0000 mg | ORAL_TABLET | Freq: Every day | ORAL | 1 refills | Status: DC

## 2020-05-04 MED ORDER — OLMESARTAN MEDOXOMIL 20 MG PO TABS: 20.0000 mg | ORAL_TABLET | Freq: Every day | ORAL | 1 refills | Status: DC

## 2020-05-04 NOTE — Telephone Encounter (Signed)
I sent in Amlodipine and Olmesartan individually. These are the 2 medications that were in his Azor. He will just have to take the medications separately.

## 2020-05-04 NOTE — Telephone Encounter (Signed)
PA approved & is effective 05/03/20-05/02/21 as long as pt remains enrolled as a member of his current plan. Pt notified of above & that prescription as been sent to the pharmacy on file.  Pt verb understanding & has no ques/concerns at this time.

## 2020-05-12 NOTE — Telephone Encounter (Signed)
Pt has picked up new medications.  Given [name]'s note; pt & wife verb understanding.

## 2020-05-14 ENCOUNTER — Other Ambulatory Visit: Payer: Self-pay

## 2020-05-14 ENCOUNTER — Ambulatory Visit: Payer: Self-pay

## 2020-05-14 ENCOUNTER — Ambulatory Visit (INDEPENDENT_AMBULATORY_CARE_PROVIDER_SITE_OTHER): Payer: 59 | Admitting: Family Medicine

## 2020-05-14 VITALS — BP 132/88 | HR 95 | Ht 67.0 in | Wt 223.6 lb

## 2020-05-14 DIAGNOSIS — M25511 Pain in right shoulder: Secondary | ICD-10-CM | POA: Diagnosis not present

## 2020-05-14 MED ORDER — PREDNISONE 50 MG PO TABS: 50.0000 mg | ORAL_TABLET | Freq: Every day | ORAL | 0 refills | Status: DC

## 2020-05-14 MED ORDER — TIZANIDINE HCL 4 MG PO TABS: 4.0000 mg | ORAL_TABLET | Freq: Four times a day (QID) | ORAL | 1 refills | Status: DC | PRN

## 2020-05-14 NOTE — Progress Notes (Signed)
   I, Peterson Lombard, LAT, ATC acting as a scribe for Lynne Leader, MD.  Subjective:    CC: Right shoulder pain  HPI: Pt is a 55 y/o male c/o R shoulder pain ongoing since yesterday, 2/16 w/ no specific mechanism. 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 Pt locates pain to R side neck and posterior aspect of shoulder.  He does not have significant chest pain.  No exertional chest pain palpitations shortness of breath.  No pain radiating down the arm.  Neck pain: yes- right side Shoulder mechanical symptoms: yes Radiates: no UE numbness/tingling: no UE weakness: no Aggravates: shoulder adb Treatments tried: Bengay, tylenol, IBU  Pertinent review of Systems: No fevers or chills nausea vomiting or diarrhea.  Relevant historical information: Hypertension   Objective:    Vitals:   05/14/20 1404  BP: 132/88  Pulse: 95  SpO2: 96%   General: Well Developed, well nourished, and in no acute distress.   MSK: C-spine normal. Nontender midline.  Tender palpation right cervical paraspinal musculature anterior musculature and trapezius. Right shoulder: Normal-appearing Tender palpation mildly AC joint.  Tender palpation mildly anterior shoulder. Decreased range of motion to right lateral flexion otherwise normal. Upper extremity strength reflexes and sensation are equal and normal throughout. Range of motion: Pain with abduction. Pulses capillary refill and sensation are intact distally.    Impression and Recommendations:    Assessment and Plan: 55 y.o. male with right shoulder pain.  Pain mostly related to the muscular shoulder girdle than actual shoulder itself. Plan for home exercise program, tizanidine and short course of prednisone.  Work note provided.  Referral placed to physical therapy especially if patient is not improving some on his own.  Very low risk that symptoms represent heart disease however did provide more detail precautions for  symptoms that would be concerning to prompt patient to go to the emergency room.Marland Kitchen  PDMP not reviewed this encounter. Orders Placed This Encounter  Procedures  . Ambulatory referral to Physical Therapy    Referral Priority:   Routine    Referral Type:   Physical Medicine    Referral Reason:   Specialty Services Required    Requested Specialty:   Physical Therapy   Meds ordered this encounter  Medications  . predniSONE (DELTASONE) 50 MG tablet    Sig: Take 1 tablet (50 mg total) by mouth daily.    Dispense:  5 tablet    Refill:  0  . tiZANidine (ZANAFLEX) 4 MG tablet    Sig: Take 1 tablet (4 mg total) by mouth every 6 (six) hours as needed for muscle spasms.    Dispense:  30 tablet    Refill:  1    Discussed warning signs or symptoms. Please see discharge instructions. Patient expresses understanding.   The above documentation has been reviewed and is accurate and complete Lynne Leader, M.D.

## 2020-05-14 NOTE — Patient Instructions (Addendum)
Thank you for coming in today.  Please complete the exercises that the athletic trainer went over with you:  View at [email or web address]-exercise-[email or web address] using code: [id number]  Take the prednisone daily for 5 days.   Use the tizanidine muscle relaxer up to 4x daily but mostly at night as needed. It my make you sleepy.   Ok to take both of these medicines with tylneol.   Heating pad should help.    Plan for PT especially if you are not improving.   Let me know if things are not getting better.   Call or go to the emergency room if you get worse, have trouble breathing, have chest pains, or palpitations.

## 2020-06-04 ENCOUNTER — Ambulatory Visit: Payer: 59 | Admitting: Physical Therapy

## 2020-06-11 ENCOUNTER — Encounter: Payer: Self-pay | Admitting: Physical Therapy

## 2020-06-11 ENCOUNTER — Other Ambulatory Visit: Payer: Self-pay

## 2020-06-11 ENCOUNTER — Ambulatory Visit: Payer: 59 | Attending: Family Medicine | Admitting: Physical Therapy

## 2020-06-11 DIAGNOSIS — M25511 Pain in right shoulder: Secondary | ICD-10-CM | POA: Insufficient documentation

## 2020-06-11 DIAGNOSIS — G8929 Other chronic pain: Secondary | ICD-10-CM | POA: Insufficient documentation

## 2020-06-11 DIAGNOSIS — M62838 Other muscle spasm: Secondary | ICD-10-CM | POA: Diagnosis present

## 2020-06-11 NOTE — Therapy (Addendum)
[place], [place], [address] Phone: [phone]   Fax:  [phone]  Physical Therapy Evaluation / Discharge  Patient Details  Name: [name] MRN: [id number] Date of Birth: 06/23/1965 Referring Provider (PT): Gregor Hams, MD   Encounter Date: 06/11/2020   PT End of Session - 06/11/20 1559    Visit Number 1    Number of Visits 7    Date for PT Re-Evaluation 07/23/20    Authorization Type FOTO 6th and 10th visit    PT Start Time 1547    PT Stop Time 1628    PT Time Calculation (min) 41 min    Activity Tolerance Patient tolerated treatment well    Behavior During Therapy [facility] for tasks assessed/performed           Past Medical History:  Diagnosis Date  . Allergy   . Arthritis   . Compartment syndrome (HCC)    right arm   . GERD (gastroesophageal reflux disease)   . Hypertension    self reported    Past Surgical History:  Procedure Laterality Date  . CARPAL TUNNEL RELEASE Right 2014  . FASCIOTOMY Right    Forearm    There were no vitals filed for this visit.    Subjective Assessment - 06/11/20 1552    Subjective pt is a 55 y.o M with CC of R shoulder pain at a month with no specific cause of R shoulder pain out side of doing alot of activity at work.  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 He reports the pain occurs in the back of the shoulder. Since onset the pain in the shoulder seems to be getting better but still bothers him. He denies any UE referral symptoms.    How long can you sit comfortably? unlimited    How long can you stand comfortably? unlimited    How long can you walk comfortably? unlimited    Currently in Pain? Yes    Pain Score 0-No pain   described as tight   Pain Location Shoulder    Pain Orientation Right    Pain Descriptors / Indicators Tightness    Pain Type Chronic pain    Pain Onset 1 to 4 weeks ago     Pain Frequency Intermittent    Aggravating Factors  doing alot of work and RUE activity    Pain Relieving Factors resting, topical ointment and massage.    Effect of Pain on Daily Activities limited head motion              [facility] PT Assessment - 06/11/20 0001      Assessment   Medical Diagnosis Acute pain of right shoulder    Referring Provider (PT) Gregor Hams, MD    Onset Date/Surgical Date --   1 month ago   Hand Dominance Right    Prior Therapy yes      Precautions   Precautions None      Restrictions   Weight Bearing Restrictions No      Balance Screen   Has the patient fallen in the past 6 months No      [place] residence      Prior Function   Level of Independence Independent    Vocation Full time employment   painter   Vocation Requirements lifting, carrying pushing/ pulling / flipping  Cognition   Overall Cognitive Status Within Functional Limits for tasks assessed      Observation/Other Assessments   Focus on Therapeutic Outcomes (FOTO)  81%   predicted 82%     Posture/Postural Control   Posture/Postural Control Postural limitations    Postural Limitations Rounded Shoulders;Forward head      ROM / Strength   AROM / PROM / Strength AROM;Strength      AROM   Overall AROM  Within functional limits for tasks performed    AROM Assessment Site Shoulder      Strength   Overall Strength Within functional limits for tasks performed    Strength Assessment Site Shoulder      Palpation   Palpation comment TTP along the R upper trap/ levator scapulae with multple trigger points.                      Objective measurements completed on examination: See above findings.       [facility] PT Treatment/Exercise - 06/11/20 0001      Shoulder Exercises: Stretch   Other Shoulder Stretches upper trap stretch 2 x 30 sec      Manual Therapy   Manual therapy comments MTPR along the Rupper trap/ levator  scapulae                  PT Education - 06/11/20 1602    Education Details evaluation findings, POC, goals, HEP with proper form/ rationale, FOTO assessment    Person(s) Educated Patient    Methods Explanation;Verbal cues;Handout    Comprehension Verbalized understanding;Verbal cues required            PT Short Term Goals - 06/11/20 1606      PT SHORT TERM GOAL #1   Title pt to be IND with inital HEP    Time 3    Period Weeks    Status New    Target Date 07/02/20             PT Long Term Goals - 06/11/20 1640      PT LONG TERM GOAL #1   Title pt to verbalize / demo efficient posture and lifting mechanics to reduce and prevent R shouler pain    Time 6    Period Weeks    Status New    Target Date 07/23/20      PT LONG TERM GOAL #2   Title reduce mucle tension in the upper trap and surrounding musculature to promote improvement in condition    Time 6    Period Weeks    Status New    Target Date 07/23/20      PT LONG TERM GOAL #3   Title pt to be perform ADLs and work related task epsecially reaching over head (perFOTO with no difficulty)    Time 6    Period Weeks    Status New    Target Date 07/23/20      PT LONG TERM GOAL #4   Title increaes FOTO score to >/= 82% to demo improvement in function    Time 6    Period Weeks    Status New    Target Date 07/23/20      PT LONG TERM GOAL #5   Title pt to be IND with all HEP and is able to maintain and progress current LOF IND    Time 6    Period Weeks    Status New    Target  Date 07/23/20                  Plan - 06/11/20 1602    Clinical Impression Statement pt is a pleasant 55 yo M presenting to OPPT with CC of R shoulder pain starting 1 month ago wit no specific MOI outside of work activity with painting. He has functional shoulder AROM and strength with no report of concordant symptoms. Functional cervical ROM with increased of R shoulder pain with R cervical rotation. TTP along the R  upper traps, levator scapuale with multiple trigger points noted. he responsed well in session to [facility] for the R upper trap/ levator scapuale followed with stretching, He would benefit from physical therapy to reduce R shoulder pain/ muscle tension, improve efficient posture and lifting mechanics and return to PLOF by addressing the defictis listed.    Stability/Clinical Decision Making Stable/Uncomplicated    Clinical Decision Making Low    Rehab Potential Good    PT Frequency 1x / week    PT Duration 6 weeks    PT Treatment/Interventions ADLs/Self Care Home Management;Cryotherapy;Iontophoresis 4mg /ml Dexamethasone;Moist Heat;Ultrasound;Therapeutic exercise;Functional mobility training;Therapeutic activities;Electrical Stimulation;Balance training;Neuromuscular re-education;Passive range of motion;Taping;Patient/family education    PT Next Visit Plan review/ update HEP PRN, discuss DN for R upper trap/ levator scapulae, functional lifting avoiding hiking the shoulder,    PT Home Exercise Plan [id number] - upper trap stretch, levator scapulae stretch, rhomboid stretch, rows, money    Consulted and Agree with Plan of Care Patient           Patient will benefit from skilled therapeutic intervention in order to improve the following deficits and impairments:  Improper body mechanics,Increased muscle spasms,Decreased strength,Pain,Decreased activity tolerance,Decreased endurance  Visit Diagnosis: Chronic right shoulder pain  Other muscle spasm     Problem List Patient Active Problem List   Diagnosis Date Noted  . Elevated ferritin 02/17/2020  . Essential hypertension 10/02/2019  . Hyperlipidemia 10/02/2019  . Spasm of thoracic back muscle 11/29/2016  . Skin tag 07/08/2016  . Acute gout of left ankle 06/15/2016  . Bronchitis due to chemical ([place]) 06/09/2016  . Hypersomnia with sleep apnea 06/09/2016  . Excessive daytime sleepiness 06/09/2016  . 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  Nightmares REM-sleep type 06/09/2016  . Upper respiratory infection 05/20/2016  . Fatigue 04/29/2016  . Erectile dysfunction due to diseases classified elsewhere 04/29/2016  . Acute upper respiratory infection 01/15/2016  . Sebaceous cyst 01/15/2016  . Routine general medical examination at a health care facility 11/17/2015  . Overweight (BMI 25.0-29.9) 11/17/2015   Starr Lake PT, DPT, LAT, ATC  06/11/20  4:48 PM      [place] [facility] [address] [place], [place], [address] Phone: [phone]   Fax:  [phone]  Name: [name] MRN: [id number] Date of Birth: 09-Nov-1965        Pt has been discharged due to no showing 3 consecutive appointments and has not returned since his evaluation.  Kristoffer Leamon PT, DPT, LAT, ATC  07/15/20  4:12 PM

## 2020-07-01 ENCOUNTER — Ambulatory Visit: Payer: 59 | Attending: Family Medicine | Admitting: Physical Therapy

## 2020-07-02 ENCOUNTER — Telehealth: Payer: Self-pay | Admitting: Physical Therapy

## 2020-07-02 ENCOUNTER — Encounter: Payer: Self-pay | Admitting: Physical Therapy

## 2020-07-02 NOTE — Telephone Encounter (Signed)
attempted to leave message regarding no-show appointment. Patients voice mail was full. Left message on my chart instead.

## 2020-07-08 ENCOUNTER — Ambulatory Visit: Payer: 59 | Admitting: Physical Therapy

## 2020-07-15 ENCOUNTER — Encounter: Payer: Self-pay | Admitting: Physical Therapy

## 2020-07-15 ENCOUNTER — Ambulatory Visit: Payer: 59 | Admitting: Physical Therapy

## 2020-07-15 ENCOUNTER — Telehealth: Payer: Self-pay | Admitting: Physical Therapy

## 2020-07-15 NOTE — Telephone Encounter (Signed)
Attempted calling unable to leave voicemail due to mailbox being full.  Will attempt to send mychart message.   Bhavana Kady PT, DPT, LAT, ATC  07/15/20  4:08 PM

## 2020-07-22 ENCOUNTER — Ambulatory Visit: Payer: 59 | Admitting: Physical Therapy

## 2020-09-22 ENCOUNTER — Other Ambulatory Visit: Payer: Self-pay

## 2020-09-22 ENCOUNTER — Encounter: Payer: Self-pay | Admitting: Family

## 2020-09-22 ENCOUNTER — Other Ambulatory Visit: Payer: Self-pay | Admitting: Family

## 2020-09-22 ENCOUNTER — Ambulatory Visit (INDEPENDENT_AMBULATORY_CARE_PROVIDER_SITE_OTHER): Payer: 59 | Admitting: Family

## 2020-09-22 VITALS — BP 142/78 | HR 72 | Temp 98.9°F | Ht 67.0 in | Wt 215.0 lb

## 2020-09-22 DIAGNOSIS — M545 Low back pain, unspecified: Secondary | ICD-10-CM

## 2020-09-22 MED ORDER — TIZANIDINE HCL 4 MG PO TABS: 4.0000 mg | ORAL_TABLET | Freq: Four times a day (QID) | ORAL | 0 refills | Status: DC | PRN

## 2020-09-22 MED ORDER — MELOXICAM 15 MG PO TABS: 15.0000 mg | ORAL_TABLET | Freq: Every day | ORAL | 0 refills | Status: DC

## 2020-09-22 NOTE — Progress Notes (Signed)
[name] is a 55 y.o. 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 90 tablet 1   azelastine (ASTELIN) 0.1 % nasal spray Place into both nostrils 2 (two) times daily. Use in each nostril as directed     ibuprofen (ADVIL) 400 MG tablet TAKE 2 TABLETS BY MOUTH EVERY 8 HOURS AS NEEDED 120 tablet 0   meloxicam (MOBIC) 15 MG tablet Take 1 tablet (15 mg total) by mouth daily. 30 tablet 0   olmesartan (BENICAR) 20 MG tablet Take 1 tablet (20 mg total) by mouth daily. 90 tablet 1   rosuvastatin (CRESTOR) 10 MG tablet Take 1 tablet (10 mg total) by mouth daily. 90 tablet 3   tiZANidine (ZANAFLEX) 4 MG tablet Take 1 tablet (4 mg total) by mouth every 6 (six) hours as needed for muscle spasms. 30 tablet 0   No current facility-administered medications for this visit.    Allergies: Doxycycline and Sildenafil  Past Medical History:  Diagnosis Date   Allergy    Arthritis    Compartment syndrome (HCC)    right arm    GERD (gastroesophageal reflux disease)     Hypertension    self reported    Past Surgical History:  Procedure Laterality Date   CARPAL TUNNEL RELEASE Right 2014   FASCIOTOMY Right    Forearm    Family History  Problem Relation Age of Onset   Cataracts Mother    Dementia Mother    Hypertension Father    Kidney disease Father    Glaucoma Maternal Grandmother    Hypertension Paternal Grandfather    Other Brother        dialysis   Other Brother        dialysis   Colon cancer Neg Hx    Esophageal cancer Neg Hx    Rectal cancer Neg Hx    Stomach cancer Neg Hx     Social History   Tobacco Use   Smoking status: Former    Pack years: 0.00    Types: Cigars    Quit date: 06/09/2012    Years since quitting: 8.2   Smokeless tobacco: Never   Tobacco comments:    occasional cigar  Substance Use Topics   Alcohol use: Not Currently    Alcohol/week: 2.0 standard drinks    Types: 2 Cans of beer per week    Comment: stopped June2021    Subjective:  Complaining of right sided flank pain- down into right lower flank; job does involve heavy lifting; symptoms are worse with movement and with walking; Has tried Ibuprofen 800 mg x 1  dose with limited relief; no changes in urinary or bowel habits;   Objective:  Vitals:   09/22/20 1556 09/22/20 1601  BP: (!) 150/78 (!) 142/78  Pulse: 72   Temp: 98.9 F (37.2 C)   TempSrc: Oral   SpO2: 99%   Weight: 215 lb (97.5 kg)   Height: 5\' 7"  (1.702 m)     General: Well developed, well nourished, in no acute distress  Skin : Warm and dry.  Head: Normocephalic and atraumatic  Eyes: Sclera and conjunctiva clear; pupils round and reactive to light; extraocular movements intact  Ears: External normal; canals clear; tympanic membranes normal  Oropharynx: Pink, supple. No suspicious lesions  Neck: Supple without thyromegaly, adenopathy  Lungs: Respirations unlabored;  Musculoskeletal: No deformities; no active joint inflammation  Extremities: No edema, cyanosis, clubbing  Vessels:  Symmetric bilaterally  Neurologic: Alert and oriented; speech intact; face symmetrical; moves all extremities well; CNII-XII intact without focal deficit   Assessment:  1. Acute right-sided low back pain without sciatica     Plan:  Rx for Mobic and Zanaflex; encouraged to rest and can apply heat; work note given as requested; follow up worse, no better.   This visit occurred during the SARS-CoV-2 public health emergency.  Safety protocols were in place, including screening questions prior to the visit, additional usage of staff PPE, and extensive cleaning of exam room while observing appropriate contact time as indicated for disinfecting solutions.    No follow-ups on file.  No orders of the defined types were placed in this encounter.   Requested Prescriptions   Signed Prescriptions Disp Refills   tiZANidine (ZANAFLEX) 4 MG tablet 30 tablet 0    Sig: Take 1 tablet (4 mg total) by mouth every 6 (six) hours as needed for muscle spasms.   meloxicam (MOBIC) 15 MG tablet 30 tablet 0    Sig: Take 1 tablet (15 mg total) by mouth daily.

## 2020-10-19 ENCOUNTER — Other Ambulatory Visit: Payer: Self-pay | Admitting: Family

## 2020-10-25 ENCOUNTER — Other Ambulatory Visit: Payer: Self-pay | Admitting: Family

## 2020-10-26 ENCOUNTER — Other Ambulatory Visit: Payer: Self-pay | Admitting: Internal Medicine

## 2020-12-08 ENCOUNTER — Encounter: Payer: 59 | Admitting: Internal Medicine

## 2020-12-28 ENCOUNTER — Encounter: Payer: Self-pay | Admitting: Internal Medicine

## 2020-12-28 ENCOUNTER — Other Ambulatory Visit: Payer: Self-pay

## 2020-12-28 ENCOUNTER — Ambulatory Visit (INDEPENDENT_AMBULATORY_CARE_PROVIDER_SITE_OTHER): Payer: 59 | Admitting: Internal Medicine

## 2020-12-28 VITALS — BP 142/78 | HR 82 | Temp 98.8°F | Resp 16 | Ht 67.0 in | Wt 205.0 lb

## 2020-12-28 DIAGNOSIS — I10 Essential (primary) hypertension: Secondary | ICD-10-CM

## 2020-12-28 DIAGNOSIS — R0683 Snoring: Secondary | ICD-10-CM | POA: Diagnosis not present

## 2020-12-28 DIAGNOSIS — M1A09X0 Idiopathic chronic gout, multiple sites, without tophus (tophi): Secondary | ICD-10-CM

## 2020-12-28 DIAGNOSIS — E785 Hyperlipidemia, unspecified: Secondary | ICD-10-CM

## 2020-12-28 DIAGNOSIS — Z0001 Encounter for general adult medical examination with abnormal findings: Secondary | ICD-10-CM

## 2020-12-28 LAB — CBC WITH DIFFERENTIAL/PLATELET
Basophils Absolute: 0 10*3/uL (ref 0.0–0.1)
Basophils Relative: 1.1 % (ref 0.0–3.0)
Eosinophils Absolute: 0 10*3/uL (ref 0.0–0.7)
Eosinophils Relative: 0.4 % (ref 0.0–5.0)
HCT: 37 % — ABNORMAL LOW (ref 39.0–52.0)
Hemoglobin: 12.3 g/dL — ABNORMAL LOW (ref 13.0–17.0)
Lymphocytes Relative: 18.3 % (ref 12.0–46.0)
Lymphs Abs: 0.8 10*3/uL (ref 0.7–4.0)
MCHC: 33.2 g/dL (ref 30.0–36.0)
MCV: 88.2 fl (ref 78.0–100.0)
Monocytes Absolute: 0.4 10*3/uL (ref 0.1–1.0)
Monocytes Relative: 10.1 % (ref 3.0–12.0)
Neutro Abs: 3.1 10*3/uL (ref 1.4–7.7)
Neutrophils Relative %: 70.1 % (ref 43.0–77.0)
Platelets: 215 10*3/uL (ref 150.0–400.0)
RBC: 4.19 Mil/uL — ABNORMAL LOW (ref 4.22–5.81)
RDW: 15.2 % (ref 11.5–15.5)
WBC: 4.4 10*3/uL (ref 4.0–10.5)

## 2020-12-28 LAB — URINALYSIS, ROUTINE W REFLEX MICROSCOPIC
Hgb urine dipstick: NEGATIVE
Ketones, ur: 40 — AB
Leukocytes,Ua: NEGATIVE
Nitrite: NEGATIVE
RBC / HPF: NONE SEEN (ref 0–?)
Specific Gravity, Urine: 1.02 (ref 1.000–1.030)
Urine Glucose: NEGATIVE
Urobilinogen, UA: 1 (ref 0.0–1.0)
pH: 6.5 (ref 5.0–8.0)

## 2020-12-28 LAB — LIPID PANEL
Cholesterol: 165 mg/dL (ref 0–200)
HDL: 79.2 mg/dL (ref >39.00)
LDL Cholesterol: 74 mg/dL (ref 0–99)
NonHDL: 85.81
Total CHOL/HDL Ratio: 2
Triglycerides: 60 mg/dL (ref 0.0–149.0)
VLDL: 12 mg/dL (ref 0.0–40.0)

## 2020-12-28 LAB — BASIC METABOLIC PANEL
BUN: 9 mg/dL (ref 6–23)
CO2: 27 mEq/L (ref 19–32)
Calcium: 9.6 mg/dL (ref 8.4–10.5)
Chloride: 99 mEq/L (ref 96–112)
Creatinine, Ser: 0.7 mg/dL (ref 0.40–1.50)
GFR: 102.38 mL/min (ref >60.00)
Glucose, Bld: 90 mg/dL (ref 70–99)
Potassium: 3.7 mEq/L (ref 3.5–5.1)
Sodium: 138 mEq/L (ref 135–145)

## 2020-12-28 LAB — HEPATIC FUNCTION PANEL
ALT: 29 U/L (ref 0–53)
AST: 38 U/L — ABNORMAL HIGH (ref 0–37)
Albumin: 4.6 g/dL (ref 3.5–5.2)
Alkaline Phosphatase: 60 U/L (ref 39–117)
Bilirubin, Direct: 0.1 mg/dL (ref 0.0–0.3)
Total Bilirubin: 0.7 mg/dL (ref 0.2–1.2)
Total Protein: 7.4 g/dL (ref 6.0–8.3)

## 2020-12-28 LAB — URIC ACID: Uric Acid, Serum: 5.8 mg/dL (ref 4.0–7.8)

## 2020-12-28 LAB — TSH: TSH: 1.26 u[IU]/mL (ref 0.35–5.50)

## 2020-12-28 LAB — PSA: PSA: 3 ng/mL (ref 0.10–4.00)

## 2020-12-28 MED ORDER — INDAPAMIDE 1.25 MG PO TABS: 1.2500 mg | ORAL_TABLET | Freq: Every day | ORAL | 0 refills | Status: DC

## 2020-12-28 MED ORDER — AMLODIPINE BESYLATE 10 MG PO TABS: 10.0000 mg | ORAL_TABLET | Freq: Every day | ORAL | 1 refills | Status: DC

## 2020-12-28 MED ORDER — ROSUVASTATIN CALCIUM 10 MG PO TABS: 10.0000 mg | ORAL_TABLET | Freq: Every day | ORAL | 0 refills | Status: DC

## 2020-12-28 NOTE — Patient Instructions (Signed)
Health Maintenance, Male Adopting a healthy lifestyle and getting preventive care are important in promoting health and wellness. Ask your health care provider about: The right schedule for you to have regular tests and exams. Things you can do on your own to prevent diseases and keep yourself healthy. What should I know about diet, weight, and exercise? Eat a healthy diet  Eat a diet that includes plenty of vegetables, fruits, low-fat dairy products, and lean protein. Do not eat a lot of foods that are high in solid fats, added sugars, or sodium. Maintain a healthy weight Body mass index (BMI) is a measurement that can be used to identify possible weight problems. It estimates body fat based on height and weight. Your health care provider can help determine your BMI and help you achieve or maintain a healthy weight. Get regular exercise Get regular exercise. This is one of the most important things you can do for your health. Most adults should: Exercise for at least 150 minutes each week. The exercise should increase your heart rate and make you sweat (moderate-intensity exercise). Do strengthening exercises at least twice a week. This is in addition to the moderate-intensity exercise. Spend less time sitting. Even light physical activity can be beneficial. Watch cholesterol and blood lipids Have your blood tested for lipids and cholesterol at 57 years of age, then have this test every 5 years. You may need to have your cholesterol levels checked more often if: Your lipid or cholesterol levels are high. You are older than 57 years of age. You are at high risk for heart disease. What should I know about cancer screening? Many types of cancers can be detected early and may often be prevented. Depending on your health history and family history, you may need to have cancer screening at various ages. This may include screening for: Colorectal cancer. Prostate cancer. Skin cancer. Lung  cancer. What should I know about heart disease, diabetes, and high blood pressure? Blood pressure and heart disease High blood pressure causes heart disease and increases the risk of stroke. This is more likely to develop in people who have high blood pressure readings, are of African descent, or are overweight. Talk with your health care provider about your target blood pressure readings. Have your blood pressure checked: Every 3-5 years if you are 18-39 years of age. Every year if you are 40 years old or older. If you are between the ages of 65 and 75 and are a current or former smoker, ask your health care provider if you should have a one-time screening for abdominal aortic aneurysm (AAA). Diabetes Have regular diabetes screenings. This checks your fasting blood sugar level. Have the screening done: Once every three years after age 45 if you are at a normal weight and have a low risk for diabetes. More often and at a younger age if you are overweight or have a high risk for diabetes. What should I know about preventing infection? Hepatitis B If you have a higher risk for hepatitis B, you should be screened for this virus. Talk with your health care provider to find out if you are at risk for hepatitis B infection. Hepatitis C Blood testing is recommended for: Everyone born from 1945 through 1965. Anyone with known risk factors for hepatitis C. Sexually transmitted infections (STIs) You should be screened each year for STIs, including gonorrhea and chlamydia, if: You are sexually active and are younger than 57 years of age. You are older than 57 years   of age and your health care provider tells you that you are at risk for this type of infection. Your sexual activity has changed since you were last screened, and you are at increased risk for chlamydia or gonorrhea. Ask your health care provider if you are at risk. Ask your health care provider about whether you are at high risk for HIV.  Your health care provider may recommend a prescription medicine to help prevent HIV infection. If you choose to take medicine to prevent HIV, you should first get tested for HIV. You should then be tested every 3 months for as long as you are taking the medicine. Follow these instructions at home: Lifestyle Do not use any products that contain nicotine or tobacco, such as cigarettes, e-cigarettes, and chewing tobacco. If you need help quitting, ask your health care provider. Do not use street drugs. Do not share needles. Ask your health care provider for help if you need support or information about quitting drugs. Alcohol use Do not drink alcohol if your health care provider tells you not to drink. If you drink alcohol: Limit how much you have to 0-2 drinks a day. Be aware of how much alcohol is in your drink. In the [place] one drink equals one 12 oz bottle of beer (355 mL), one 5 oz glass of wine (148 mL), or one 1 oz glass of hard liquor (44 mL). General instructions Schedule regular health, dental, and eye exams. Stay current with your vaccines. Tell your health care provider if: You often feel depressed. You have ever been abused or do not feel safe at home. Summary Adopting a healthy lifestyle and getting preventive care are important in promoting health and wellness. Follow your health care provider's instructions about healthy diet, exercising, and getting tested or screened for diseases. Follow your health care provider's instructions on monitoring your cholesterol and blood pressure. This information is not intended to replace advice given to you by your health care provider. Make sure you discuss any questions you have with your health care provider. Document Revised: 05/22/2020 Document Reviewed: 03/07/2018 Elsevier Patient Education  2022 [employer] Inc.  

## 2020-12-28 NOTE — Progress Notes (Signed)
Subjective:  Patient ID: [name], male    DOB: 06-03-1965  Age: 55 y.o. MRN: [id number]  CC: Annual Exam, Hypertension, and Hyperlipidemia  This visit occurred during the SARS-CoV-2 public health emergency.  Safety protocols were in place, including screening questions prior to the visit, additional usage of staff PPE, and extensive cleaning of exam room while observing appropriate contact time as indicated for disinfecting solutions.    HPI [name] presents for a CPX, to establish, and f/up -  He is very active and denies chest pain, shortness of breath, diaphoresis, dizziness, or lightheadedness.  He gets about 5 or 6 hours of sleep.  He complains of fatigue and excessive daytime somnolence with frequent napping.  His wife complains about his snoring.  Outpatient Medications Prior to Visit  Medication Sig Dispense Refill   azelastine (ASTELIN) 0.1 % nasal spray Place into both nostrils 2 (two) times daily. Use in each nostril as directed     amLODipine (NORVASC) 10 MG tablet Take 1 tablet (10 mg total) by mouth daily. 90 tablet 1   ibuprofen (ADVIL) 400 MG tablet TAKE 2 TABLETS BY MOUTH EVERY 8 HOURS AS NEEDED 120 tablet 0   olmesartan (BENICAR) 20 MG tablet Take 1 tablet (20 mg total) by mouth daily. 90 tablet 1   rosuvastatin (CRESTOR) 10 MG tablet TAKE 1 TABLET BY MOUTH EVERY DAY 90 tablet 0   tiZANidine (ZANAFLEX) 4 MG tablet Take 1 tablet (4 mg total) by mouth every 6 (six) hours as needed for muscle spasms. 30 tablet 0   No facility-administered medications prior to visit.    ROS Review of Systems  Constitutional:  Positive for fatigue. Negative for appetite change, chills, diaphoresis, fever and unexpected weight change.  HENT: Negative.    Eyes: Negative.   Respiratory:  Negative for cough, chest tightness, shortness of breath and wheezing.   Cardiovascular:  Negative for chest pain, palpitations and leg swelling.  Gastrointestinal:  Negative for abdominal  pain, constipation, diarrhea, nausea and vomiting.  Endocrine: Negative.   Genitourinary: Negative.  Negative for difficulty urinating, dysuria, hematuria, scrotal swelling, testicular pain and urgency.  Musculoskeletal:  Negative for arthralgias and myalgias.  Skin: Negative.   Neurological: Negative.  Negative for dizziness, seizures, facial asymmetry, weakness, numbness and headaches.  Hematological:  Negative for adenopathy. Does not bruise/bleed easily.  Psychiatric/Behavioral: Negative.     Objective:  BP (!) 142/78 (BP Location: Left Arm, Patient Position: Sitting, Cuff Size: Large)   Pulse 82   Temp 98.8 F (37.1 C) (Oral)   Resp 16   Ht 5\' 7"  (1.702 m)   Wt 205 lb (93 kg)   SpO2 98%   BMI 32.11 kg/m   BP Readings from Last 3 Encounters:  12/28/20 (!) 142/78  09/22/20 (!) 142/78  05/14/20 132/88    Wt Readings from Last 3 Encounters:  12/28/20 205 lb (93 kg)  09/22/20 215 lb (97.5 kg)  05/14/20 223 lb 9.6 oz (101.4 kg)    Physical Exam Vitals reviewed.  HENT:     Nose: Nose normal.     Mouth/Throat:     Mouth: Mucous membranes are moist.  Eyes:     Conjunctiva/sclera: Conjunctivae normal.  Cardiovascular:     Rate and Rhythm: Normal rate and regular rhythm.     Heart sounds: No murmur heard. Pulmonary:     Effort: Pulmonary effort is normal.     Breath sounds: No stridor. No wheezing, rhonchi or rales.  Abdominal:  General: Abdomen is flat.     Palpations: There is no mass.     Tenderness: There is no abdominal tenderness. There is no guarding.     Hernia: No hernia is present. There is no hernia in the left inguinal area or right inguinal area.  Genitourinary:    Penis: Normal and uncircumcised. No discharge or lesions.      Testes: Normal.        Right: Mass, tenderness or swelling not present.        Left: Mass, tenderness or swelling not present.     Epididymis:     Right: Normal.     Left: Normal.     Prostate: Normal. Not enlarged, not  tender and no nodules present.     Rectum: Guaiac result negative. No mass, tenderness, anal fissure, external hemorrhoid or internal hemorrhoid. Abnormal anal tone (anal stenosis).  Musculoskeletal:        General: Normal range of motion.     Cervical back: Neck supple.     Right lower leg: Edema (trace pitting) present.     Left lower leg: Edema (trace pitting) present.  Lymphadenopathy:     Cervical: No cervical adenopathy.     Lower Body: No right inguinal adenopathy. No left inguinal adenopathy.  Skin:    General: Skin is warm and dry.     Coloration: Skin is not pale.  Neurological:     General: No focal deficit present.     Mental Status: He is alert and oriented to person, place, and time. Mental status is at baseline.  Psychiatric:        Mood and Affect: Mood normal.        Behavior: Behavior normal.    Lab Results  Component Value Date   WBC 4.4 12/28/2020   HGB 12.3 (L) 12/28/2020   HCT 37.0 (L) 12/28/2020   PLT 215.0 12/28/2020   GLUCOSE 90 12/28/2020   CHOL 165 12/28/2020   TRIG 60.0 12/28/2020   HDL 79.20 12/28/2020   LDLCALC 74 12/28/2020   ALT 29 12/28/2020   AST 38 (H) 12/28/2020   NA 138 12/28/2020   K 3.7 12/28/2020   CL 99 12/28/2020   CREATININE 0.70 12/28/2020   BUN 9 12/28/2020   CO2 27 12/28/2020   TSH 1.26 12/28/2020   PSA 3.00 12/28/2020   HGBA1C 6.1 05/02/2016    No results found.  Assessment & Plan:   [name] was seen today for annual exam, hypertension and hyperlipidemia.  Diagnoses and all orders for this visit:  Essential hypertension- His blood pressure is not adequately well controlled and he has trace lower extremity edema which I think is caused by amlodipine.  However, he adamantly wants to continue taking amlodipine.  Will discontinue the ARB and will add a thiazide diuretic. -     CBC with Differential/Platelet; Future -     Basic metabolic panel; Future -     Hepatic function panel; Future -     TSH; Future -      Urinalysis, Routine w reflex microscopic; Future -     indapamide (LOZOL) 1.25 MG tablet; Take 1 tablet (1.25 mg total) by mouth daily. -     Urinalysis, Routine w reflex microscopic -     TSH -     Hepatic function panel -     Basic metabolic panel -     CBC with Differential/Platelet -     amLODipine (NORVASC) 10 MG tablet; Take 1  tablet (10 mg total) by mouth daily.  Encounter for general adult medical examination with abnormal findings- Exam completed, labs reviewed, vaccines reviewed - He refused a flu vaccine, cancer screenings are up-to-date, patient education was given. -     Lipid panel; Future -     PSA; Future -     PSA -     Lipid panel  Loud snoring -     Ambulatory referral to Sleep Studies  Idiopathic chronic gout of multiple sites without tophus- He has achieved his uric acid goal and has had no recent episodes of gouty arthritis. -     Uric acid; Future -     Uric acid  Hyperlipidemia LDL goal <130- I recommended that he take a statin for cardiovascular risk reduction. -     rosuvastatin (CRESTOR) 10 MG tablet; Take 1 tablet (10 mg total) by mouth daily.  I have discontinued Enos Olguin's olmesartan, tiZANidine, and ibuprofen. I have also changed his rosuvastatin. Additionally, I am having him start on indapamide. Lastly, I am having him maintain his azelastine and amLODipine.  Meds ordered this encounter  Medications   indapamide (LOZOL) 1.25 MG tablet    Sig: Take 1 tablet (1.25 mg total) by mouth daily.    Dispense:  90 tablet    Refill:  0   rosuvastatin (CRESTOR) 10 MG tablet    Sig: Take 1 tablet (10 mg total) by mouth daily.    Dispense:  90 tablet    Refill:  0   amLODipine (NORVASC) 10 MG tablet    Sig: Take 1 tablet (10 mg total) by mouth daily.    Dispense:  90 tablet    Refill:  1      Follow-up: Return in about 3 months (around 03/30/2021).  Scarlette Calico, MD

## 2021-01-07 ENCOUNTER — Other Ambulatory Visit: Payer: Self-pay | Admitting: Internal Medicine

## 2021-01-07 ENCOUNTER — Telehealth: Payer: Self-pay | Admitting: Internal Medicine

## 2021-01-07 DIAGNOSIS — N521 Erectile dysfunction due to diseases classified elsewhere: Secondary | ICD-10-CM

## 2021-01-07 MED ORDER — TADALAFIL 5 MG PO TABS: 5.0000 mg | ORAL_TABLET | Freq: Every day | ORAL | 1 refills | Status: DC

## 2021-01-07 NOTE — Telephone Encounter (Signed)
Patient requesting a call about physical  Patient would not go into detail about questions concerning physical.  Please call

## 2021-01-07 NOTE — Telephone Encounter (Signed)
Pt would like to have an ED medication send in to the pharmacy. Please advise.

## 2021-01-11 ENCOUNTER — Telehealth: Payer: Self-pay

## 2021-01-11 NOTE — Telephone Encounter (Signed)
Key: [id number]

## 2021-01-13 ENCOUNTER — Encounter (HOSPITAL_COMMUNITY): Payer: Self-pay | Admitting: Oncology

## 2021-01-13 ENCOUNTER — Emergency Department (HOSPITAL_COMMUNITY)
Admission: EM | Admit: 2021-01-13 | Discharge: 2021-01-13 | Disposition: A | Discharge status: Home / Self Care | Payer: 59 | Attending: Emergency Medicine | Admitting: Emergency Medicine

## 2021-01-13 ENCOUNTER — Emergency Department (HOSPITAL_COMMUNITY): Payer: 59

## 2021-01-13 ENCOUNTER — Other Ambulatory Visit: Payer: Self-pay

## 2021-01-13 DIAGNOSIS — R319 Hematuria, unspecified: Secondary | ICD-10-CM

## 2021-01-13 DIAGNOSIS — I10 Essential (primary) hypertension: Secondary | ICD-10-CM | POA: Insufficient documentation

## 2021-01-13 DIAGNOSIS — M549 Dorsalgia, unspecified: Secondary | ICD-10-CM

## 2021-01-13 DIAGNOSIS — Z79899 Other long term (current) drug therapy: Secondary | ICD-10-CM | POA: Diagnosis not present

## 2021-01-13 DIAGNOSIS — M545 Low back pain, unspecified: Secondary | ICD-10-CM | POA: Diagnosis not present

## 2021-01-13 DIAGNOSIS — Z87891 Personal history of nicotine dependence: Secondary | ICD-10-CM | POA: Diagnosis not present

## 2021-01-13 DIAGNOSIS — X500XXA Overexertion from strenuous movement or load, initial encounter: Secondary | ICD-10-CM | POA: Insufficient documentation

## 2021-01-13 DIAGNOSIS — Y990 Civilian activity done for income or pay: Secondary | ICD-10-CM | POA: Diagnosis not present

## 2021-01-13 LAB — URINALYSIS, ROUTINE W REFLEX MICROSCOPIC
Bacteria, UA: NONE SEEN
Bilirubin Urine: NEGATIVE
Glucose, UA: NEGATIVE mg/dL
Ketones, ur: NEGATIVE mg/dL
Leukocytes,Ua: NEGATIVE
Nitrite: NEGATIVE
Protein, ur: NEGATIVE mg/dL
Specific Gravity, Urine: 1.006 (ref 1.005–1.030)
pH: 6 (ref 5.0–8.0)

## 2021-01-13 MED ORDER — NAPROXEN 500 MG PO TABS: 500.0000 mg | ORAL_TABLET | Freq: Two times a day (BID) | ORAL | 0 refills | Status: DC

## 2021-01-13 MED ORDER — ONDANSETRON 8 MG PO TBDP: 8.0000 mg | ORAL_TABLET | Freq: Three times a day (TID) | ORAL | 0 refills | Status: DC | PRN

## 2021-01-13 MED ORDER — HYDROCODONE-ACETAMINOPHEN 5-325 MG PO TABS: 1.0000 | ORAL_TABLET | Freq: Four times a day (QID) | ORAL | 0 refills | Status: DC | PRN

## 2021-01-13 MED ORDER — CYCLOBENZAPRINE HCL 10 MG PO TABS: 10.0000 mg | ORAL_TABLET | Freq: Two times a day (BID) | ORAL | 0 refills | Status: DC | PRN

## 2021-01-13 NOTE — ED Provider Notes (Signed)
[facility] DEPT Provider Note   CSN: [id number] Arrival date & time: 01/13/21  1134     History Chief Complaint  Patient presents with   Back Pain    [name] is a 55 y.o. male.   Back Pain  Patient presented to the ED for evaluation of back pain.  Patient states he does do a lot of lifting at work.  He does not recall any specific injuries.  When he was bending over this morning to pick something up he had pain in his mid back and on both sides and it was difficult to stand up.  Patient was unable to go to work because of the pain and discomfort.  It has decreased in severity but is still present.  Denies any abdominal pain.  No fevers or chills.  No nausea or vomiting.  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   Excessive daytime sleepiness 06/09/2016   Parasomnia, 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   Nightmares REM-sleep type 06/09/2016   Fatigue 04/29/2016   Erectile dysfunction due to diseases classified elsewhere 04/29/2016   Overweight (BMI 25.0-29.9) 11/17/2015    Past Surgical History:  Procedure Laterality Date   CARPAL TUNNEL RELEASE Right 2014   FASCIOTOMY Right    Forearm       Family History  Problem Relation Age of Onset   Cataracts Mother    Dementia Mother    Hypertension Father    Kidney disease Father    Glaucoma Maternal Grandmother     Hypertension Paternal Grandfather    Other Brother        dialysis   Other Brother        dialysis   Colon cancer Neg Hx    Esophageal cancer Neg Hx    Rectal cancer Neg Hx    Stomach cancer Neg Hx     Social History   Tobacco Use   Smoking status: Former    Types: Cigars    Quit date: 06/09/2012    Years since quitting: 8.6   Smokeless tobacco: Never   Tobacco comments:    occasional cigar  Vaping Use   Vaping Use: Never used  Substance Use Topics   Alcohol use: Yes    Alcohol/week: 10.0 standard drinks    Types: 10 Cans of beer per week    Comment: stopped June2021   Drug use: No    Home Medications Prior to Admission medications   Medication Sig Start Date End Date Taking? Authorizing Provider  cyclobenzaprine (FLEXERIL) 10 MG tablet Take 1 tablet (10 mg total) by mouth 2 (two) times daily as needed for muscle spasms. 01/13/21  Yes Dorie Rank, MD  HYDROcodone-acetaminophen (NORCO/VICODIN) 5-325 MG tablet Take 1 tablet by mouth every 6 (six) hours as needed. 01/13/21  Yes Dorie Rank, MD  naproxen (NAPROSYN) 500 MG tablet Take 1 tablet (500 mg  total) by mouth 2 (two) times daily with a meal. As needed for pain 01/13/21  Yes Dorie Rank, MD  ondansetron (ZOFRAN ODT) 8 MG disintegrating tablet Take 1 tablet (8 mg total) by mouth every 8 (eight) hours as needed for nausea or vomiting. 01/13/21  Yes Dorie Rank, MD  amLODipine (NORVASC) 10 MG tablet Take 1 tablet (10 mg total) by mouth daily. 12/28/20   Janith Lima, MD  azelastine (ASTELIN) 0.1 % nasal spray Place into both nostrils 2 (two) times daily. Use in each nostril as directed    [provider]  indapamide (LOZOL) 1.25 MG tablet Take 1 tablet (1.25 mg total) by mouth daily. 12/28/20   Janith Lima, MD  rosuvastatin (CRESTOR) 10 MG tablet Take 1 tablet (10 mg total) by mouth daily. 12/28/20   Janith Lima, MD  tadalafil (CIALIS) 5 MG tablet Take 1 tablet (5 mg total) by mouth daily. 01/07/21   Janith Lima, MD    Allergies    Doxycycline and Sildenafil  Review of Systems   Review of Systems  Musculoskeletal:  Positive for back pain.  All other systems reviewed and are negative.  Physical Exam Updated Vital Signs BP 140/75   Pulse 70   Temp 98.8 F (37.1 C) (Oral)   Resp 18   Ht 1.727 m (5\' 8" )   Wt 90.7 kg   SpO2 99%   BMI 30.41 kg/m   Physical Exam Vitals and nursing note reviewed.  Constitutional:      General: He is not in acute distress.    Appearance: He is well-developed.  HENT:     Head: Normocephalic and atraumatic.     Right Ear: External ear normal.     Left Ear: External ear normal.  Eyes:     General: No scleral icterus.       Right eye: No discharge.        Left eye: No discharge.     Conjunctiva/sclera: Conjunctivae normal.  Neck:     Trachea: No tracheal deviation.  Cardiovascular:     Rate and Rhythm: Normal rate and regular rhythm.  Pulmonary:     Effort: Pulmonary effort is normal. No respiratory distress.     Breath sounds: Normal breath sounds. No stridor. No wheezing or rales.  Abdominal:     General: Bowel sounds are normal. There is no distension.     Palpations: Abdomen is soft.     Tenderness: There is no abdominal tenderness. There is no guarding or rebound.  Musculoskeletal:        General: Tenderness present. No deformity.     Cervical back: Neck supple.     Comments: Mild tenderness palpation paraspinal region proximal lumbar region  Skin:    General: Skin is warm and dry.     Findings: No rash.  Neurological:     General: No focal deficit present.     Mental Status: He is alert.     Cranial Nerves: No cranial nerve deficit (no facial droop, extraocular movements intact, no slurred speech).     Sensory: No sensory deficit.     Motor: No abnormal muscle tone or seizure activity.     Coordination: Coordination normal.  Psychiatric:        Mood and Affect: Mood normal.    ED Results / Procedures / Treatments   Labs (all  labs ordered are listed, but only abnormal results are displayed) Labs Reviewed  URINALYSIS, ROUTINE W REFLEX MICROSCOPIC -  Abnormal; Notable for the following components:      Result Value   Color, Urine STRAW (*)    Hgb urine dipstick SMALL (*)    All other components within normal limits    EKG None  Radiology DG Thoracic Spine 2 View  Result Date: 01/13/2021 CLINICAL DATA:  Back pain EXAM: THORACIC SPINE 2 VIEWS COMPARISON:  CT 08/02/2010 FINDINGS: Dextroscoliosis. Vertebral body heights are maintained. Diffuse osteophytosis of the thoracic spine. IMPRESSION: Scoliosis and diffuse degenerative changes. No acute osseous abnormality. Electronically Signed   By: Donavan Foil M.D.   On: 01/13/2021 15:34   DG Lumbar Spine Complete  Result Date: 01/13/2021 CLINICAL DATA:  Back pain EXAM: LUMBAR SPINE - COMPLETE 4+ VIEW COMPARISON:  None. FINDINGS: Lumbar alignment within normal limits. Vertebral body heights are maintained. Diffuse bulky osteophytosis. Mild facet degenerative changes of the lower lumbar spine. Possible punctate stone over left kidney. IMPRESSION: Mild degenerative changes without acute osseous abnormality. Possible punctate left kidney stone. Electronically Signed   By: Donavan Foil M.D.   On: 01/13/2021 15:32    Procedures Procedures   Medications Ordered in ED Medications - No data to display  ED Course  I have reviewed the triage vital signs and the nursing notes.  Pertinent labs & imaging results that were available during my care of the patient were reviewed by me and considered in my medical decision making (see chart for details).    MDM Rules/Calculators/A&P                           Patient presented to the ED for evaluation of back pain.  Symptoms suggest more of a musculoskeletal nature with the pain with movement.  However he has had history of kidney stones and the urine did show trace hemoglobin although no red blood cells were seen on the  dipstick.  Patient's plain films do show the possibility of a kidney stone at this time the patient was comfortable not having any significant pain.  Discussed these findings and I think we can treat symptomatically at this time.  Do not feel that CT scan is necessary as at this point as the patient's symptoms are rather mild.  We will treat symptomatically.  Discussed outpatient follow-up with PCP if symptoms or not improving would consider CT scan for kidney stone Final Clinical Impression(s) / ED Diagnoses Final diagnoses:  Back pain, unspecified back location, unspecified back pain laterality, unspecified chronicity  Hematuria, unspecified type    Rx / DC Orders ED Discharge Orders          Ordered    naproxen (NAPROSYN) 500 MG tablet  2 times daily with meals        01/13/21 1632    cyclobenzaprine (FLEXERIL) 10 MG tablet  2 times daily PRN        01/13/21 1632    ondansetron (ZOFRAN ODT) 8 MG disintegrating tablet  Every 8 hours PRN        01/13/21 1632    HYDROcodone-acetaminophen (NORCO/VICODIN) 5-325 MG tablet  Every 6 hours PRN        01/13/21 1632             Dorie Rank, MD 01/13/21 1634

## 2021-01-13 NOTE — Discharge Instructions (Signed)
Take the medications as needed for pain.  Use the urine strainer to see if you passed a kidney stone.  Follow-up with your doctor to be rechecked.

## 2021-01-13 NOTE — ED Notes (Signed)
An After Visit Summary was printed and given to the patient. Discharge instructions given and no further questions at this time.  

## 2021-01-13 NOTE — ED Triage Notes (Signed)
Pt reports lower back pain that became severe this morning.  Pt states he lifts heavy objects at work, bent over to pick clothes up off of floor and found it difficult to stand straight up d/t pain.

## 2021-02-13 ENCOUNTER — Encounter (HOSPITAL_COMMUNITY): Payer: Self-pay | Admitting: Emergency Medicine

## 2021-02-13 ENCOUNTER — Emergency Department (HOSPITAL_COMMUNITY)
Admission: EM | Admit: 2021-02-13 | Discharge: 2021-02-13 | Disposition: A | Discharge status: Home / Self Care | Payer: 59 | Attending: Emergency Medicine | Admitting: Emergency Medicine

## 2021-02-13 ENCOUNTER — Emergency Department (HOSPITAL_COMMUNITY): Payer: 59

## 2021-02-13 DIAGNOSIS — M25511 Pain in right shoulder: Secondary | ICD-10-CM | POA: Diagnosis present

## 2021-02-13 DIAGNOSIS — I10 Essential (primary) hypertension: Secondary | ICD-10-CM | POA: Diagnosis not present

## 2021-02-13 DIAGNOSIS — Y990 Civilian activity done for income or pay: Secondary | ICD-10-CM | POA: Insufficient documentation

## 2021-02-13 DIAGNOSIS — Z87891 Personal history of nicotine dependence: Secondary | ICD-10-CM | POA: Insufficient documentation

## 2021-02-13 DIAGNOSIS — Z79899 Other long term (current) drug therapy: Secondary | ICD-10-CM | POA: Diagnosis not present

## 2021-02-13 DIAGNOSIS — X500XXA Overexertion from strenuous movement or load, initial encounter: Secondary | ICD-10-CM | POA: Insufficient documentation

## 2021-02-13 MED ORDER — CYCLOBENZAPRINE HCL 10 MG PO TABS: 10.0000 mg | ORAL_TABLET | Freq: Three times a day (TID) | ORAL | 0 refills | Status: DC | PRN

## 2021-02-13 MED ORDER — LIDOCAINE 5 % EX PTCH
1.0000 | MEDICATED_PATCH | CUTANEOUS | Status: DC
  Filled 2021-02-13: qty 1

## 2021-02-13 MED ORDER — LIDOCAINE 5 % EX PTCH: 1.0000 | MEDICATED_PATCH | CUTANEOUS | 0 refills | Status: DC

## 2021-02-13 NOTE — ED Notes (Signed)
Pt declined vitals recheck.

## 2021-02-13 NOTE — ED Triage Notes (Signed)
Pt reports R shoulder pain x 4-5 days. Denies injury. Hurts worse with movement. States that it makes it difficult to sleep. Has tried ibuprofen and a muscle relaxer without relief. A&Ox4.

## 2021-02-13 NOTE — ED Provider Notes (Signed)
[facility] DEPT Provider Note   CSN: [id number] Arrival date & time: 02/13/21  4008     History Chief Complaint  Patient presents with   Shoulder Pain    [name] is a 55 y.o. male.  Patient presents to the emergency department with chief complaint of right shoulder pain.  He states its been hurting for about the past 4 to 5 days.  He states the pain is behind his shoulder blade.  He states that he may have injured it while lifting heavy objects for work.  He has tried taking ibuprofen and a muscle relaxer without relief.  He states that the pain keeps him from sleeping.  It is worsened with palpatio  The history is provided by the patient. A language interpreter was used.      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    GERD (gastroesophageal reflux disease)    Hypertension    self reported    Patient Active Problem List   Diagnosis Date Noted   Encounter for general adult medical examination with abnormal findings 12/28/2020   Loud snoring 12/28/2020   Idiopathic chronic gout of multiple sites without tophus 12/28/2020   Elevated ferritin 02/17/2020   Essential hypertension 10/02/2019   Hyperlipidemia 10/02/2019   Hypersomnia with sleep apnea 06/09/2016   Excessive daytime sleepiness 06/09/2016   Parasomnia, 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   Nightmares REM-sleep type 06/09/2016   Fatigue 04/29/2016   Erectile dysfunction due to diseases classified elsewhere 04/29/2016   Overweight (BMI 25.0-29.9) 11/17/2015    Past Surgical History:  Procedure Laterality Date   CARPAL TUNNEL RELEASE Right 2014   FASCIOTOMY Right    Forearm       Family History  Problem Relation Age of Onset   Cataracts Mother    Dementia Mother    Hypertension Father    Kidney disease Father    Glaucoma Maternal Grandmother    Hypertension Paternal Grandfather    Other Brother        dialysis    Other Brother        dialysis   Colon cancer Neg Hx    Esophageal cancer Neg Hx    Rectal cancer Neg Hx    Stomach cancer Neg Hx     Social History   Tobacco Use   Smoking status: Former    Types: Cigars    Quit date: 06/09/2012    Years since quitting: 8.6   Smokeless tobacco: Never   Tobacco comments:    occasional cigar  Vaping Use   Vaping Use: Never used  Substance Use Topics   Alcohol use: Yes    Alcohol/week: 10.0 standard drinks    Types: 10 Cans of beer per week    Comment: stopped June2021   Drug use: No    Home Medications Prior to Admission medications   Medication Sig Start Date End Date Taking? Authorizing Provider  amLODipine (NORVASC) 10 MG tablet Take 1 tablet (10 mg total) by mouth daily. 12/28/20   Janith Lima, MD  azelastine (ASTELIN) 0.1 % nasal spray Place into both nostrils 2 (two) times daily. Use in each nostril as directed    [provider]  cyclobenzaprine (FLEXERIL) 10 MG tablet Take 1 tablet (10 mg total) by mouth 2 (two) times daily as needed for muscle spasms. 01/13/21   Dorie Rank, MD  HYDROcodone-acetaminophen (NORCO/VICODIN) 5-325 MG  tablet Take 1 tablet by mouth every 6 (six) hours as needed. 01/13/21   Dorie Rank, MD  indapamide (LOZOL) 1.25 MG tablet Take 1 tablet (1.25 mg total) by mouth daily. 12/28/20   Janith Lima, MD  naproxen (NAPROSYN) 500 MG tablet Take 1 tablet (500 mg total) by mouth 2 (two) times daily with a meal. As needed for pain 01/13/21   Dorie Rank, MD  ondansetron (ZOFRAN ODT) 8 MG disintegrating tablet Take 1 tablet (8 mg total) by mouth every 8 (eight) hours as needed for nausea or vomiting. 01/13/21   Dorie Rank, MD  rosuvastatin (CRESTOR) 10 MG tablet Take 1 tablet (10 mg total) by mouth daily. 12/28/20   Janith Lima, MD  tadalafil (CIALIS) 5 MG tablet Take 1 tablet (5 mg total) by mouth daily. 01/07/21   Janith Lima, MD    Allergies    Doxycycline and Sildenafil  Review of Systems    Review of Systems  All other systems reviewed and are negative.  Physical Exam Updated Vital Signs BP (!) 147/90 (BP Location: Left Wrist)   Pulse 86   Temp 98 F (36.7 C) (Oral)   Resp 17   Ht 5\' 7"  (1.702 m)   Wt 88.5 kg   BMI 30.54 kg/m   Physical Exam Vitals and nursing note reviewed.  Constitutional:      General: He is not in acute distress.    Appearance: He is well-developed. He is not ill-appearing.  HENT:     Head: Normocephalic and atraumatic.  Eyes:     Conjunctiva/sclera: Conjunctivae normal.  Cardiovascular:     Rate and Rhythm: Normal rate.  Pulmonary:     Effort: Pulmonary effort is normal. No respiratory distress.  Abdominal:     General: There is no distension.  Musculoskeletal:     Cervical back: Neck supple.     Comments: Normal range of motion and strength of right upper extremity, tenderness palpation over upper right back musculature  Skin:    General: Skin is warm and dry.     Comments: No rash  Neurological:     Mental Status: He is alert and oriented to person, place, and time.  Psychiatric:        Mood and Affect: Mood normal.        Behavior: Behavior normal.    ED Results / Procedures / Treatments   Labs (all labs ordered are listed, but only abnormal results are displayed) Labs Reviewed - No data to display  EKG None  Radiology No results found.  Procedures Procedures   Medications Ordered in ED Medications - No data to display  ED Course  I have reviewed the triage vital signs and the nursing notes.  Pertinent labs & imaging results that were available during my care of the patient were reviewed by me and considered in my medical decision making (see chart for details).    MDM Rules/Calculators/A&P                           Patient presents with injury to right shoulder.  DDx includes, fracture, strain, or sprain.  Consultants: none  Plain films reveal degenerative changes.  Pt advised to follow up with PCP  and/or orthopedics. Patient given lidoderm partch while in ED, conservative therapy such as RICE recommended and discussed.   Patient will be discharged home & is agreeable with above plan. Returns precautions discussed. Pt appears safe for  discharge.   Final Clinical Impression(s) / ED Diagnoses Final diagnoses:  Acute pain of right shoulder    Rx / DC Orders ED Discharge Orders          Ordered    lidocaine (LIDODERM) 5 %  Every 24 hours        02/13/21 0632    cyclobenzaprine (FLEXERIL) 10 MG tablet  3 times daily PRN        02/13/21 3845             Montine Circle, PA-C 02/13/21 3646    Horton, Barbette Hair, MD 02/14/21 [phone]

## 2021-03-03 ENCOUNTER — Other Ambulatory Visit: Payer: Self-pay | Admitting: Internal Medicine

## 2021-03-10 ENCOUNTER — Institutional Professional Consult (permissible substitution): Payer: Self-pay | Admitting: Neurology

## 2021-03-25 ENCOUNTER — Other Ambulatory Visit: Payer: Self-pay | Admitting: Internal Medicine

## 2021-03-25 DIAGNOSIS — I10 Essential (primary) hypertension: Secondary | ICD-10-CM

## 2021-06-17 ENCOUNTER — Ambulatory Visit (INDEPENDENT_AMBULATORY_CARE_PROVIDER_SITE_OTHER): Payer: 59

## 2021-06-17 ENCOUNTER — Telehealth: Payer: Self-pay | Admitting: Nurse Practitioner

## 2021-06-17 ENCOUNTER — Ambulatory Visit (INDEPENDENT_AMBULATORY_CARE_PROVIDER_SITE_OTHER): Payer: 59 | Admitting: Nurse Practitioner

## 2021-06-17 ENCOUNTER — Other Ambulatory Visit: Payer: Self-pay

## 2021-06-17 VITALS — BP 144/90 | HR 100 | Temp 98.6°F | Ht 67.0 in | Wt 189.4 lb

## 2021-06-17 DIAGNOSIS — R634 Abnormal weight loss: Secondary | ICD-10-CM

## 2021-06-17 DIAGNOSIS — Z6829 Body mass index (BMI) 29.0-29.9, adult: Secondary | ICD-10-CM | POA: Diagnosis not present

## 2021-06-17 NOTE — Telephone Encounter (Signed)
Please call patient let him know that I did further chart review and it does appear that he was recommended to have colonoscopy repeated in 5 years.  Thus he will be due for colonoscopy this year, I will order referral back to gastroenterology so that he can get this set up.  This is for further evaluation of his unintentional weight loss.  Please let me know if he has any questions. ?

## 2021-06-17 NOTE — Progress Notes (Signed)
? ? ? ?Subjective:  ?Patient ID: [name], male    DOB: 24-Mar-1966  Age: 56 y.o. MRN: [id number] ? ?CC:  ?Chief Complaint  ?Patient presents with  ? Weight Loss  ? Fatigue  ?  ? ? ?HPI  ?This patient arrives today for the above. ? ?He reports unintentional weight loss over the last year.  Per his scale at home he has lost approximately 62 pounds in the last 12 months unintentionally (230lbs - 168lbs; based on his home scale).  He is also been experiencing fatigue.  He smokes cigars but denies ever smoking cigarettes.  Weight loss started when he caught COVID last year, but continued even after he recuperated.  Last colonoscopy was completed in 2018 which found polyps.  It appears that he is due for repeat colonoscopy this year.  He tells me he has felt a "abdominal knot" which he thinks may be a hernia.  Its not causing him any pain.  He also is a daily drinker and reports 2 beers per day.  He also reports that he does not have much of an appetite. ? ?Past Medical History:  ?Diagnosis Date  ? Allergy   ? Arthritis   ? Compartment syndrome ([place])   ? right arm   ? GERD (gastroesophageal reflux disease)   ? Hypertension   ? self reported  ? ? ? ? ?Family History  ?Problem Relation Age of Onset  ? Cataracts Mother   ? Dementia Mother   ? Hypertension Father   ? Kidney disease Father   ? Glaucoma Maternal Grandmother   ? Hypertension Paternal Grandfather   ? Other Brother   ?     dialysis  ? Other Brother   ?     dialysis  ? Colon cancer Neg Hx   ? Esophageal cancer Neg Hx   ? Rectal cancer Neg Hx   ? Stomach cancer Neg Hx   ? ? ?Social History  ? ?Social History Narrative  ? Fun: [employer]  ? Lives at home with wife  ? Caffeine - coffee, one daily  ? ?Social History  ? ?Tobacco Use  ? Smoking status: Former  ?  Types: Cigars  ?  Quit date: 06/09/2012  ?  Years since quitting: 9.0  ? Smokeless tobacco: Never  ? Tobacco comments:  ?  occasional cigar  ?Substance Use Topics  ? Alcohol use: Yes  ?  Alcohol/week: 10.0  standard drinks  ?  Types: 10 Cans of beer per week  ?  Comment: stopped June2021  ? ? ? ?Current Meds  ?Medication Sig  ? amLODipine (NORVASC) 10 MG tablet Take 1 tablet (10 mg total) by mouth daily.  ? azelastine (ASTELIN) 0.1 % nasal spray Place into both nostrils 2 (two) times daily. Use in each nostril as directed  ? cyclobenzaprine (FLEXERIL) 10 MG tablet Take 1 tablet (10 mg total) by mouth 3 (three) times daily as needed for muscle spasms.  ? HYDROcodone-acetaminophen (NORCO/VICODIN) 5-325 MG tablet Take 1 tablet by mouth every 6 (six) hours as needed.  ? indapamide (LOZOL) 1.25 MG tablet TAKE 1 TABLET BY MOUTH DAILY.  ? lidocaine (LIDODERM) 5 % Place 1 patch onto the skin daily. Remove & Discard patch within 12 hours or as directed by MD  ? ondansetron (ZOFRAN ODT) 8 MG disintegrating tablet Take 1 tablet (8 mg total) by mouth every 8 (eight) hours as needed for nausea or vomiting.  ? rosuvastatin (CRESTOR) 10 MG tablet Take 1 tablet (  10 mg total) by mouth daily.  ? tadalafil (CIALIS) 5 MG tablet Take 1 tablet (5 mg total) by mouth daily.  ? ? ?ROS:  ?Review of Systems  ?Constitutional:  Positive for malaise/fatigue.  ?Respiratory:  Negative for shortness of breath.   ?Cardiovascular:  Negative for chest pain.  ?Gastrointestinal:  Negative for abdominal pain and blood in stool.  ?     (+) anorexia  ?Genitourinary:  Negative for hematuria.  ? ? ?Objective:  ? ?Today's Vitals: BP (!) 144/90   Pulse 100   Temp 98.6 ?F (37 ?C) (Oral)   Ht '5\' 7"'$  (1.702 m)   Wt 189 lb 6 oz (85.9 kg)   SpO2 99%   BMI 29.66 kg/m?  ? ?  06/17/2021  ?  3:44 PM 02/13/2021  ?  3:56 AM 01/13/2021  ?  4:12 PM  ?Vitals with BMI  ?Height '5\' 7"'$  '5\' 7"'$    ?Weight 189 lbs 6 oz 195 lbs   ?BMI 29.65 30.53   ?Systolic 646 803 212  ?Diastolic 90 90 75  ?Pulse 100 86 70  ?  ? ?Physical Exam ?Vitals reviewed.  ?Constitutional:   ?   Appearance: Normal appearance.  ?HENT:  ?   Head: Normocephalic and atraumatic.  ?Cardiovascular:  ?   Rate and  Rhythm: Normal rate and regular rhythm.  ?Pulmonary:  ?   Effort: Pulmonary effort is normal.  ?   Breath sounds: Normal breath sounds.  ?Abdominal:  ?   Palpations: There is no mass.  ?   Hernia: A hernia is present.  ?Musculoskeletal:  ?   Cervical back: Neck supple.  ?Skin: ?   General: Skin is warm and dry.  ?Neurological:  ?   Mental Status: He is alert and oriented to person, place, and time.  ?Psychiatric:     ?   Mood and Affect: Mood normal.     ?   Behavior: Behavior normal.     ?   Thought Content: Thought content normal.     ?   Judgment: Judgment normal.  ? ? ? ? ?POC Stool [place]: Negative ? ? ?Assessment and Plan  ? ?1. Unintentional weight loss   ? ? ? ?Plan: ?1.  Etiology unclear at this time.  We will collect blood work for further evaluation today, further recommendations we made based upon these results.  We will refer patient back to GI as he is due for colonoscopy as well as order CT scan of abdomen pelvis for further evaluation of what he describes as a "abdominal knot".  We will also order chest x-ray to look for any signs of malignancy.  He was encouraged to try to eat 3-6 small meals a day. ? ? ?Tests ordered ?Orders Placed This Encounter  ?Procedures  ? DG Chest 2 View  ? CT Abdomen Pelvis Wo Contrast  ? TSH  ? Hemoglobin A1c  ? Comprehensive metabolic panel  ? CBC with Differential/Platelet  ? Sedimentation rate  ? C-reactive protein  ? T3, free  ? T4, free  ? Iron  ? Ferritin  ? HIV Antibody (routine testing w rflx)  ? RPR  ? Ambulatory referral to Gastroenterology  ? ? ? ? ?No orders of the defined types were placed in this encounter. ? ? ?Patient to follow-up in 1 month for close monitoring, or sooner as needed. ? ?Ailene Ards, NP ? ?

## 2021-06-18 LAB — FERRITIN: Ferritin: 337.2 ng/mL — ABNORMAL HIGH (ref 22.0–322.0)

## 2021-06-18 LAB — TSH: TSH: 2.18 u[IU]/mL (ref 0.35–5.50)

## 2021-06-18 LAB — COMPREHENSIVE METABOLIC PANEL
ALT: 22 U/L (ref 0–53)
AST: 39 U/L — ABNORMAL HIGH (ref 0–37)
Albumin: 4.7 g/dL (ref 3.5–5.2)
Alkaline Phosphatase: 52 U/L (ref 39–117)
BUN: 10 mg/dL (ref 6–23)
CO2: 26 mEq/L (ref 19–32)
Calcium: 9.8 mg/dL (ref 8.4–10.5)
Chloride: 99 mEq/L (ref 96–112)
Creatinine, Ser: 0.79 mg/dL (ref 0.40–1.50)
GFR: 98.38 mL/min (ref >60.00)
Glucose, Bld: 87 mg/dL (ref 70–99)
Potassium: 4.1 mEq/L (ref 3.5–5.1)
Sodium: 137 mEq/L (ref 135–145)
Total Bilirubin: 0.5 mg/dL (ref 0.2–1.2)
Total Protein: 8 g/dL (ref 6.0–8.3)

## 2021-06-18 LAB — SEDIMENTATION RATE: Sed Rate: 33 mm/hr — ABNORMAL HIGH (ref 0–20)

## 2021-06-18 LAB — RPR: RPR Ser Ql: NONREACTIVE

## 2021-06-18 LAB — CBC WITH DIFFERENTIAL/PLATELET
Basophils Absolute: 0 10*3/uL (ref 0.0–0.1)
Basophils Relative: 0.7 % (ref 0.0–3.0)
Eosinophils Absolute: 0 10*3/uL (ref 0.0–0.7)
Eosinophils Relative: 0.6 % (ref 0.0–5.0)
HCT: 40.6 % (ref 39.0–52.0)
Hemoglobin: 13.8 g/dL (ref 13.0–17.0)
Lymphocytes Relative: 19.8 % (ref 12.0–46.0)
Lymphs Abs: 1 10*3/uL (ref 0.7–4.0)
MCHC: 33.9 g/dL (ref 30.0–36.0)
MCV: 90.3 fl (ref 78.0–100.0)
Monocytes Absolute: 0.5 10*3/uL (ref 0.1–1.0)
Monocytes Relative: 10.6 % (ref 3.0–12.0)
Neutro Abs: 3.4 10*3/uL (ref 1.4–7.7)
Neutrophils Relative %: 68.3 % (ref 43.0–77.0)
Platelets: 261 10*3/uL (ref 150.0–400.0)
RBC: 4.49 Mil/uL (ref 4.22–5.81)
RDW: 14.3 % (ref 11.5–15.5)
WBC: 5 10*3/uL (ref 4.0–10.5)

## 2021-06-18 LAB — HEMOGLOBIN A1C: Hgb A1c MFr Bld: 6 % (ref 4.6–6.5)

## 2021-06-18 LAB — HIV ANTIBODY (ROUTINE TESTING W REFLEX): HIV 1&2 Ab, 4th Generation: NONREACTIVE

## 2021-06-18 LAB — T4, FREE: Free T4: 0.98 ng/dL (ref 0.60–1.60)

## 2021-06-18 LAB — T3, FREE: T3, Free: 4.1 pg/mL (ref 2.3–4.2)

## 2021-06-18 LAB — IRON: Iron: 92 ug/dL (ref 42–165)

## 2021-06-18 LAB — C-REACTIVE PROTEIN: CRP: <1 mg/dL (ref 0.5–20.0)

## 2021-06-18 NOTE — Telephone Encounter (Signed)
Called patient to inform him that a referral has been put in to [facility] for a colonoscopy. Patient verbalize understanding  ? ?

## 2021-06-21 ENCOUNTER — Telehealth: Payer: Self-pay | Admitting: Internal Medicine

## 2021-06-21 NOTE — Telephone Encounter (Signed)
Pt checking status of 06-17-2021 ordered by Judson Roch ? ?Provider has not resulted the results as of yet ? ?Please call pt w/ results ?

## 2021-06-24 ENCOUNTER — Telehealth: Payer: Self-pay | Admitting: Nurse Practitioner

## 2021-06-24 DIAGNOSIS — R634 Abnormal weight loss: Secondary | ICD-10-CM

## 2021-06-24 NOTE — Telephone Encounter (Signed)
Please call this patient and get him scheduled for follow-up blood work within the next week or 2.  I did send him a MyChart message regarding his current results.  If he has any questions please refer to these notes or ask me.  I will place lab orders now.  Thank you. ?

## 2021-06-25 NOTE — Telephone Encounter (Signed)
LDVM for the pt with his lab apptment for 07/09/2021 at 9am. ?

## 2021-06-25 NOTE — Telephone Encounter (Addendum)
Pt returned call for results. I advised the pt of Jeralyn Ruths, NP result note saying Overall blood work shows signs of nonspecific inflammation.  Does not provide [place] with obvious cause of weight loss.  More specifically, your kidney function and electrolytes are stable, no anemia was noted, HIV and syphilis testing are negative, your thyroid appears to be functioning correctly, you are not anemic, your A1c is in the prediabetic range but is not significantly elevated. I would like to have you come in for some additional blood test.  ? ?Pt rescheduled his labs to 07/09/21 230 ? ?Pt understood and had no further questions. ? ?FYI ?

## 2021-06-25 NOTE — Telephone Encounter (Signed)
Called pt and relayed results. Pt verbalized understanding and has lab appt scheduled for 4/14 ?

## 2021-06-29 ENCOUNTER — Ambulatory Visit (HOSPITAL_COMMUNITY): Payer: 59

## 2021-06-29 ENCOUNTER — Telehealth: Payer: Self-pay | Admitting: Nurse Practitioner

## 2021-06-29 NOTE — Telephone Encounter (Signed)
[name] and/or [name], would one of you either forward this request to someone that has time to make a phone call or call this patient yourself?  Please inform him that the CT scan I had ordered for him is important regarding finding out what could be causing his unintentional weight loss. He was scheduled for this on 06/29/21, but did not show up to the scan. If there is any concern or question about the scan on his part please let me know so I can offer any clarification he may need. With the amount of weight he reports losing I am concerned about possibility of unidentified cancer. If we do no do the scan the risk of missing a possible cancer is high which would result in delays in possible treatment. His weight loss may not be from a cancer, additionally the scan is only of his abdomen and pelvis so it will not show [place] his whole body, but the CT scan was ordered to look for any tumors/masses in abdomen/pelvis. Thus, I highly recommend he gets this done. If he is willing to do so, I can reorder the scan. Please let me know if he is willing to undergo the scan, if he is I will reorder this when I return to the office on Thursday. Thank you for your help! ?

## 2021-06-30 NOTE — Telephone Encounter (Signed)
LM requesting pt return my call regarding recent CT scan that he missed. I relayed how important this scan is and to please give me a call back at the office so we can discuss this further.  ?

## 2021-07-09 ENCOUNTER — Other Ambulatory Visit: Payer: 59

## 2021-07-12 ENCOUNTER — Other Ambulatory Visit: Payer: Self-pay | Admitting: Nurse Practitioner

## 2021-07-12 DIAGNOSIS — R634 Abnormal weight loss: Secondary | ICD-10-CM

## 2021-07-12 NOTE — Progress Notes (Signed)
Ct abdomen and pelvis order  ?

## 2021-07-12 NOTE — Telephone Encounter (Signed)
Will you call this patient and remind him he needs to come back for follow-up lab work? He had a lab appointment on 4/14, but I do not think he came to it. His labs have been ordered, so he can just come to lab at his own convenience. Additionally, if you are able to speak to him will you again reiterate the importance of imaging and see if he is willing to undergo CT scan. If he is I can reorder the test. If he is not I will cancel it. Please review what I sent earlier about imaging importance below to relay to the patient. Thank you. ?

## 2021-07-12 NOTE — Telephone Encounter (Signed)
Called pt and let him know that he needs to come back for follow up lab work at his convenience. Pt verbalized understanding and that he will come in and get that done. I also went over the importance of him getting a CT scan to find out what could be causing the unintentional weight loss and relayed the rest of information below. Pt decided he will go forward with the CT scan and Judson Roch can order.  ?

## 2021-07-29 ENCOUNTER — Ambulatory Visit: Payer: 59 | Admitting: Internal Medicine

## 2021-08-02 ENCOUNTER — Encounter: Payer: Self-pay | Admitting: Internal Medicine

## 2021-08-17 ENCOUNTER — Encounter: Payer: Self-pay | Admitting: Gastroenterology

## 2021-10-06 ENCOUNTER — Telehealth: Payer: Self-pay | Admitting: Internal Medicine

## 2021-10-06 ENCOUNTER — Other Ambulatory Visit: Payer: Self-pay | Admitting: Internal Medicine

## 2021-10-06 DIAGNOSIS — I10 Essential (primary) hypertension: Secondary | ICD-10-CM

## 2021-10-06 MED ORDER — AMLODIPINE BESYLATE 10 MG PO TABS: 10.0000 mg | ORAL_TABLET | Freq: Every day | ORAL | 0 refills | Status: DC

## 2021-10-06 NOTE — Telephone Encounter (Signed)
1.Medication Requested: amLODipine (NORVASC) 10 MG tablet 2. Pharmacy (Name, Street, [place]): [id number] [place] Phone:  [phone] Fax:  [phone]    3. On Med List: yes   4. Last Visit with PCP: 3.23.2023  5. Next visit date with PCP: 7.14.2023   Please send over short supply until time of appt.   Agent: Please be advised that RX refills may take up to 3 business days. We ask that you follow-up with your pharmacy.

## 2021-10-08 ENCOUNTER — Ambulatory Visit: Payer: 59 | Admitting: Nurse Practitioner

## 2021-10-14 ENCOUNTER — Encounter: Payer: Self-pay | Admitting: Internal Medicine

## 2021-10-14 ENCOUNTER — Ambulatory Visit (INDEPENDENT_AMBULATORY_CARE_PROVIDER_SITE_OTHER): Payer: 59 | Admitting: Internal Medicine

## 2021-10-14 DIAGNOSIS — E785 Hyperlipidemia, unspecified: Secondary | ICD-10-CM

## 2021-10-14 DIAGNOSIS — R634 Abnormal weight loss: Secondary | ICD-10-CM

## 2021-10-14 DIAGNOSIS — I10 Essential (primary) hypertension: Secondary | ICD-10-CM | POA: Diagnosis not present

## 2021-10-14 DIAGNOSIS — N521 Erectile dysfunction due to diseases classified elsewhere: Secondary | ICD-10-CM

## 2021-10-14 MED ORDER — TADALAFIL 5 MG PO TABS: 5.0000 mg | ORAL_TABLET | Freq: Every day | ORAL | 3 refills | Status: DC

## 2021-10-14 MED ORDER — ROSUVASTATIN CALCIUM 10 MG PO TABS: 10.0000 mg | ORAL_TABLET | Freq: Every day | ORAL | 3 refills | Status: DC

## 2021-10-14 MED ORDER — INDAPAMIDE 1.25 MG PO TABS: 1.2500 mg | ORAL_TABLET | Freq: Every day | ORAL | 3 refills | Status: DC

## 2021-10-14 MED ORDER — AMLODIPINE BESYLATE 10 MG PO TABS: 10.0000 mg | ORAL_TABLET | Freq: Every day | ORAL | 3 refills | Status: DC

## 2021-10-14 NOTE — Progress Notes (Signed)
   Subjective:   Patient ID: [name], male    DOB: Sep 07, 1965, 56 y.o.   MRN: [id number]  HPI The patient is a 56 YO male coming in for follow up.  Review of Systems  Constitutional: Negative.   HENT: Negative.    Eyes: Negative.   Respiratory:  Negative for cough, chest tightness and shortness of breath.   Cardiovascular:  Negative for chest pain, palpitations and leg swelling.  Gastrointestinal:  Negative for abdominal distention, abdominal pain, constipation, diarrhea, nausea and vomiting.  Musculoskeletal: Negative.   Skin: Negative.   Neurological: Negative.   Psychiatric/Behavioral: Negative.      Objective:  Physical Exam Constitutional:      Appearance: He is well-developed.  HENT:     Head: Normocephalic and atraumatic.  Cardiovascular:     Rate and Rhythm: Normal rate and regular rhythm.  Pulmonary:     Effort: Pulmonary effort is normal. No respiratory distress.     Breath sounds: Normal breath sounds. No wheezing or rales.  Abdominal:     General: Bowel sounds are normal. There is no distension.     Palpations: Abdomen is soft.     Tenderness: There is no abdominal tenderness. There is no rebound.  Musculoskeletal:     Cervical back: Normal range of motion.  Skin:    General: Skin is warm and dry.  Neurological:     Mental Status: He is alert and oriented to person, place, and time.     Coordination: Coordination normal.     Vitals:   10/14/21 1003  BP: 124/86  Pulse: 98  Resp: 18  SpO2: 99%  Weight: 181 lb 6.4 oz (82.3 kg)  Height: '5\' 7"'$  (1.702 m)    Assessment & Plan:

## 2021-10-14 NOTE — Patient Instructions (Signed)
We have sent in the medicine refills and have given you the note.

## 2021-10-15 DIAGNOSIS — R634 Abnormal weight loss: Secondary | ICD-10-CM | POA: Insufficient documentation

## 2021-10-15 NOTE — Assessment & Plan Note (Signed)
He was previously seen and CT abdomen/pelvis ordered which he did not get done. He denies further weight loss although I did let him know that he is down another 8 pounds since prior visit per our scales and he should consider doing the CT scan to rule out cause of the weight loss. Continue close monitoring with PCP.

## 2021-10-15 NOTE — Assessment & Plan Note (Signed)
Needs refill on tadalafil 5 mg daily which is done today.

## 2021-10-15 NOTE — Assessment & Plan Note (Signed)
BP at goal on lozol 1.25 mg daily and amlodipine 10 mg daily. Recent labs normal so will not recheck today. Continue.

## 2022-01-16 ENCOUNTER — Encounter (HOSPITAL_COMMUNITY): Payer: Self-pay | Admitting: Emergency Medicine

## 2022-01-16 ENCOUNTER — Emergency Department (HOSPITAL_COMMUNITY): Payer: 59

## 2022-01-16 ENCOUNTER — Other Ambulatory Visit: Payer: Self-pay

## 2022-01-16 ENCOUNTER — Emergency Department (HOSPITAL_COMMUNITY)
Admission: EM | Admit: 2022-01-16 | Discharge: 2022-01-16 | Disposition: A | Discharge status: Home / Self Care | Payer: Self-pay | Attending: Emergency Medicine | Admitting: Emergency Medicine

## 2022-01-16 DIAGNOSIS — S39012A Strain of muscle, fascia and tendon of lower back, initial encounter: Secondary | ICD-10-CM | POA: Insufficient documentation

## 2022-01-16 DIAGNOSIS — Z87891 Personal history of nicotine dependence: Secondary | ICD-10-CM | POA: Insufficient documentation

## 2022-01-16 DIAGNOSIS — I10 Essential (primary) hypertension: Secondary | ICD-10-CM | POA: Insufficient documentation

## 2022-01-16 DIAGNOSIS — X58XXXA Exposure to other specified factors, initial encounter: Secondary | ICD-10-CM | POA: Insufficient documentation

## 2022-01-16 MED ORDER — CYCLOBENZAPRINE HCL 10 MG PO TABS: 10.0000 mg | ORAL_TABLET | Freq: Two times a day (BID) | ORAL | 0 refills | Status: DC | PRN

## 2022-01-16 MED ORDER — IBUPROFEN 600 MG PO TABS: 600.0000 mg | ORAL_TABLET | Freq: Four times a day (QID) | ORAL | 0 refills | Status: DC | PRN

## 2022-01-16 NOTE — ED Provider Triage Note (Signed)
Emergency Medicine Provider Triage Evaluation Note  [name] , a 56 y.o. male  was evaluated in triage.  Pt complains of back pain x1 week.  Denies injury/trauma/falls but does report he does strenuous activity at work.  Pain worse yesterday after using heating pad.  Denies numbness/weakness of legs.  No bowel or bladder incontinence.  Review of Systems  Positive: Back pain Negative: Numbness, weakness, incontinencen  Physical Exam  BP (!) 156/91 (BP Location: Right Arm)   Pulse 75   Temp 98.3 F (36.8 C) (Oral)   Resp 20   SpO2 100%  Gen:   Awake, no distress   Resp:  Normal effort  MSK:   Moves extremities without difficulty  Other:  LS without noted deformity, ambulatory  Medical Decision Making  Medically screening exam initiated at 1:23 AM.  Appropriate orders placed.  Kevis Qu was informed that the remainder of the evaluation will be completed by another provider, this initial triage assessment does not replace that evaluation, and the importance of remaining in the ED until their evaluation is complete.  Back pain x1 week without noted injury.  No focal deficits in triage.  Will obtain screening x-ray.   Larene Pickett, PA-C 01/16/22 [phone]

## 2022-01-16 NOTE — ED Triage Notes (Signed)
Patient reports pain across lower back this week , denies injury or fall , no urinary discomfort .

## 2022-01-16 NOTE — Discharge Instructions (Addendum)
Note the work-up today at the emergency department was overall reassuring.  Your x-ray showed signs of degenerative changes but no acute fracture or dislocation.  We will treat your injury with ibuprofen, ice, stretching as well as muscle laxer to use as needed.  As discussed, muscle laxer can cause drowsiness so please do not take this in the morning or before driving.  Recommend follow-up by your PCP in 3 to 5 days for reevaluation of your symptoms.  Please do not hesitate to return to emergency department for worrisome signs and symptoms we discussed become apparent.

## 2022-01-16 NOTE — ED Provider Notes (Signed)
[facility] EMERGENCY DEPARTMENT Provider Note   CSN: [id number] Arrival date & time: 01/16/22  0104     History  Chief Complaint  Patient presents with   Back Pain    Taym Twist is a 56 y.o. male.   Back Pain   56 year old male presents emergency department complaints of back pain.  Patient states the back pain occurred approximate 1 week ago when he was at work.  He works [employer] and states he was bent over and when he stood up, noted acute onset cramping type back pain.  Denies any repeat mechanism of injury.  He states that since then, back pain has been responding well to ibuprofen as well as ice at home.  He states he tried to use heat yesterday which made his symptoms worse.  He reports the emergency department due to inability to sleep but states that during current HPI, back pain is significantly better.  Denies fever, bowel/bladder dysfunction, weakness/sensory deficits in lower extremities, history of IV drug use, known malignancy, chronic steroid use.  Reports pain is worsened with movement of back.  It is relieved with ibuprofen, ice and stretching of affected area.  Past medical history significant for GERD,  Home Medications Prior to Admission medications   Medication Sig Start Date End Date Taking? Authorizing Provider  cyclobenzaprine (FLEXERIL) 10 MG tablet Take 1 tablet (10 mg total) by mouth 2 (two) times daily as needed for muscle spasms. 01/16/22  Yes Dion Saucier A, PA  ibuprofen (ADVIL) 600 MG tablet Take 1 tablet (600 mg total) by mouth every 6 (six) hours as needed. 01/16/22  Yes Dion Saucier A, PA  amLODipine (NORVASC) 10 MG tablet Take 1 tablet (10 mg total) by mouth daily. 10/14/21   Hoyt Koch, MD  azelastine (ASTELIN) 0.1 % nasal spray Place into both nostrils 2 (two) times daily. Use in each nostril as directed    [provider]  indapamide (LOZOL) 1.25 MG tablet Take 1 tablet  (1.25 mg total) by mouth daily. 10/14/21   Hoyt Koch, MD  ondansetron (ZOFRAN ODT) 8 MG disintegrating tablet Take 1 tablet (8 mg total) by mouth every 8 (eight) hours as needed for nausea or vomiting. 01/13/21   Dorie Rank, MD  rosuvastatin (CRESTOR) 10 MG tablet Take 1 tablet (10 mg total) by mouth daily. 10/14/21   Hoyt Koch, MD  tadalafil (CIALIS) 5 MG tablet Take 1 tablet (5 mg total) by mouth daily. 10/14/21   Hoyt Koch, MD      Allergies    Doxycycline and Sildenafil    Review of Systems   Review of Systems  Musculoskeletal:  Positive for back pain.    Physical Exam Updated Vital Signs BP (!) 153/94 (BP Location: Right Arm)   Pulse 68   Temp 98.5 F (36.9 C) (Oral)   Resp 16   SpO2 100%  Physical Exam Vitals and nursing note reviewed.  Constitutional:      General: He is not in acute distress.    Appearance: He is well-developed.  HENT:     Head: Normocephalic and atraumatic.  Eyes:     Conjunctiva/sclera: Conjunctivae normal.  Cardiovascular:     Rate and Rhythm: Normal rate and regular rhythm.     Heart sounds: No murmur heard. Pulmonary:     Effort: Pulmonary effort is normal. No respiratory distress.     Breath sounds: Normal breath sounds.  Abdominal:     Palpations:  Abdomen is soft.     Tenderness: There is no abdominal tenderness.  Musculoskeletal:        General: No swelling.     Cervical back: Neck supple.     Comments: No midline tenderness of cervical, thoracic, lumbar spine with no obvious step-off or deformity.  Mild tenderness to palpation paraspinally in the lumbar region.  Muscle strength out of 5 for lower extremities.  Patient is full active range of motion of bilateral hips, knees, ankles, digits.  Dorsalis pedis pulses full and intact bilaterally.  Patient has no sensory deficits along major effusions of the lower extremities.  Straight leg raise negative bilaterally.  DTR symmetric and equal for lower extremities.   Skin:    General: Skin is warm and dry.     Capillary Refill: Capillary refill takes less than 2 seconds.  Neurological:     Mental Status: He is alert.  Psychiatric:        Mood and Affect: Mood normal.     ED Results / Procedures / Treatments   Labs (all labs ordered are listed, but only abnormal results are displayed) Labs Reviewed - No data to display  EKG None  Radiology DG Lumbar Spine Complete  Result Date: 01/16/2022 CLINICAL DATA:  Back pain for 1 week.  No injury. EXAM: LUMBAR SPINE - COMPLETE 4+ VIEW COMPARISON:  01/13/2021 FINDINGS: Five lumbar type vertebral bodies. Normal alignment. Degenerative changes throughout with mild disc space narrowing and prominent bridging osteophytes. Degenerative changes in the lower lumbar facet joints. No vertebral compression deformities. No focal bone lesion or bone destruction. Visualized sacrum appears intact. IMPRESSION: Degenerative changes in the lumbar spine with bridging osteophytes anteriorly. Normal alignment. No acute displaced fractures are identified. Electronically Signed   By: Lucienne Capers M.D.   On: 01/16/2022 01:53    Procedures Procedures    Medications Ordered in ED Medications - No data to display  ED Course/ Medical Decision Making/ A&P                           Medical Decision Making Risk Prescription drug management.   This patient presents to the ED for concern of low back pain, this involves an extensive number of treatment options, and is a complaint that carries with it a high risk of complications and morbidity.  The differential diagnosis includes cauda equina, spinal epidural abscess, lumbar strain, fracture, dislocation, transverse myelitis, dislocation, osteomyelitis   Co morbidities that complicate the patient evaluation  See HPI   Additional history obtained:  Additional history obtained from EMR External records from outside source obtained and reviewed including hospital  records   Lab Tests:  N/a   Imaging Studies ordered:  I ordered imaging studies including lumbar x-ray I independently visualized and interpreted imaging which showed degenerative changes in the lumbar spine with bridging osteophytes anteriorly.  Normal alignment.  No acute displaced fractures are identified. I agree with the radiologist interpretation  Cardiac Monitoring: / EKG:  The patient was maintained on a cardiac monitor.  I personally viewed and interpreted the cardiac monitored which showed an underlying rhythm of: Sinus rhythm   Consultations Obtained:  N/a   Problem List / ED Course / Critical interventions / Medication management  Low back pain Reevaluation of the patient showed that the patient stayed the same I have reviewed the patients home medicines and have made adjustments as needed   Social Determinants of Health:  Former cigarette use.  Denies illicit drug use.   Test / Admission - Considered:  Low back pain Vitals signs significant for mild hypertension with a blood pressure of 153/94.  Recommend close follow-up with PCP regarding elevation of blood pressure.. Otherwise within normal range and stable throughout visit. Laboratory/imaging studies significant for: See above Patient symptoms likely secondary to lumbar strain given aspects of HPI, reassuring physical exam.  Doubt cauda equina.  Doubt AAA.  Doubt spinal epidural epidural abscess.  Symptomatic therapy recommended outpatient with ibuprofen, ice, stretching as well as muscle laxer to use as needed.  Patient was educated regarding the side effect profile of muscle laxer's at length.  Close follow-up with PCP recommended in 3 to 5 days.  Treatment plan discussed at length with patient he acknowledged understanding was agreeable to said plan. Worrisome signs and symptoms were discussed with the patient, and the patient acknowledged understanding to return to the ED if noticed. Patient was stable  upon discharge.          Final Clinical Impression(s) / ED Diagnoses Final diagnoses:  Strain of lumbar region, initial encounter    Rx / DC Orders ED Discharge Orders          Ordered    cyclobenzaprine (FLEXERIL) 10 MG tablet  2 times daily PRN        01/16/22 1151    ibuprofen (ADVIL) 600 MG tablet  Every 6 hours PRN        01/16/22 1151              Wilnette Kales, [place] 01/16/22 1547    Varney Biles, MD 01/16/22 2014

## 2022-04-17 IMAGING — DX DG CHEST 2V
3 series · 3 of 3 positions shown · non-contrast
Comparison: Chest x-ray report 06/02/2010

CLINICAL DATA: Unintentional weight loss and fatigue

EXAM:
CHEST - 2 VIEW

[chest pa (1 of 2)]
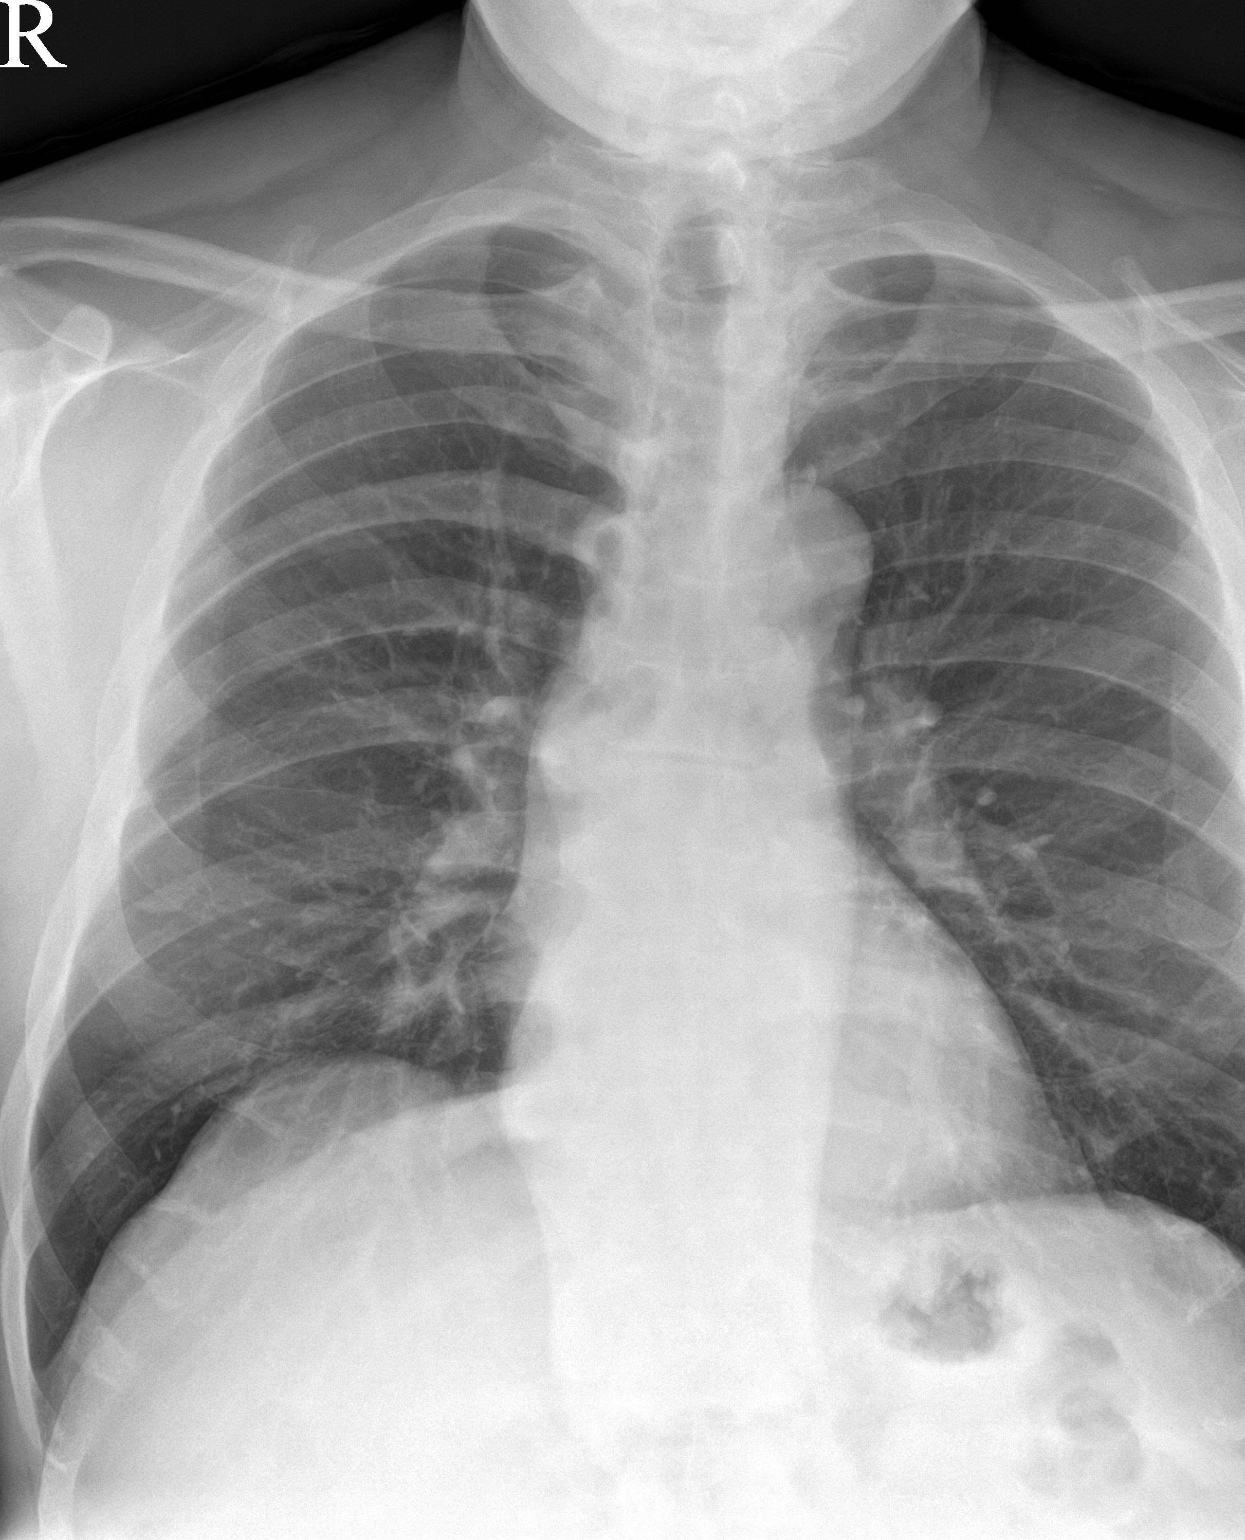

[chest lat]
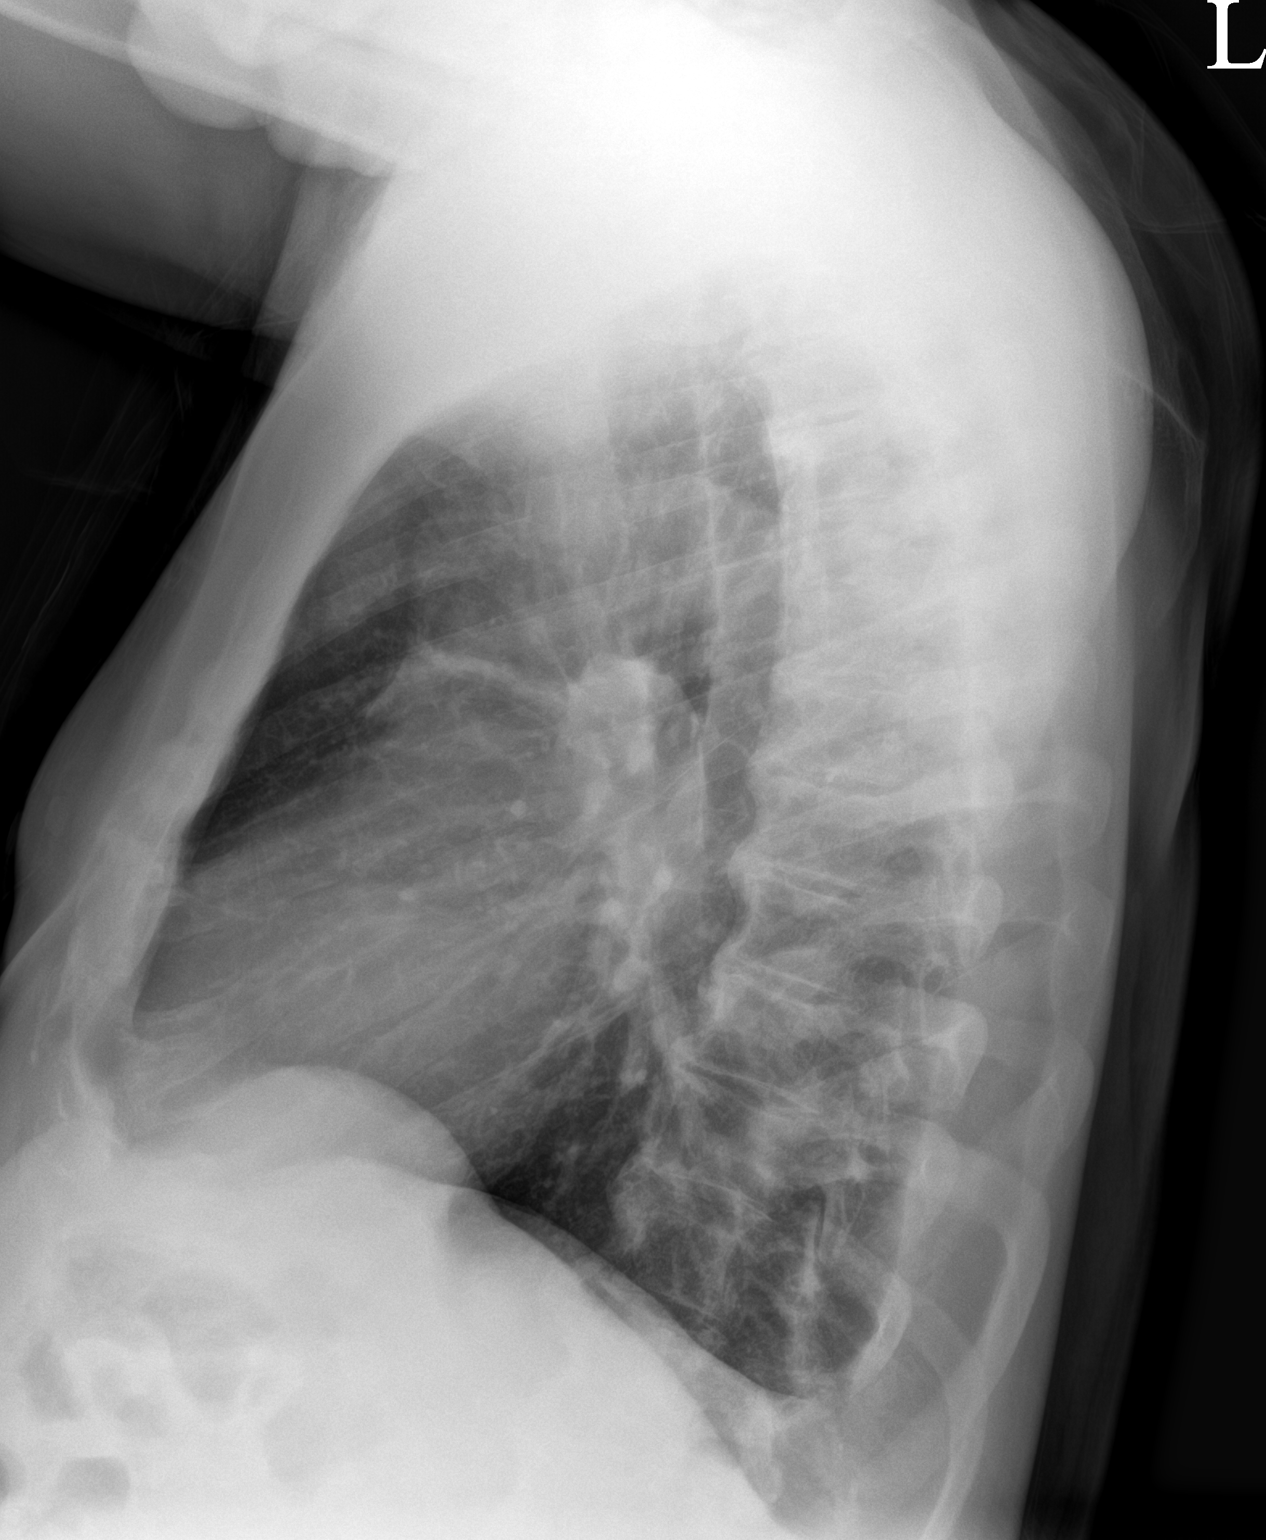

[chest pa (2 of 2)]
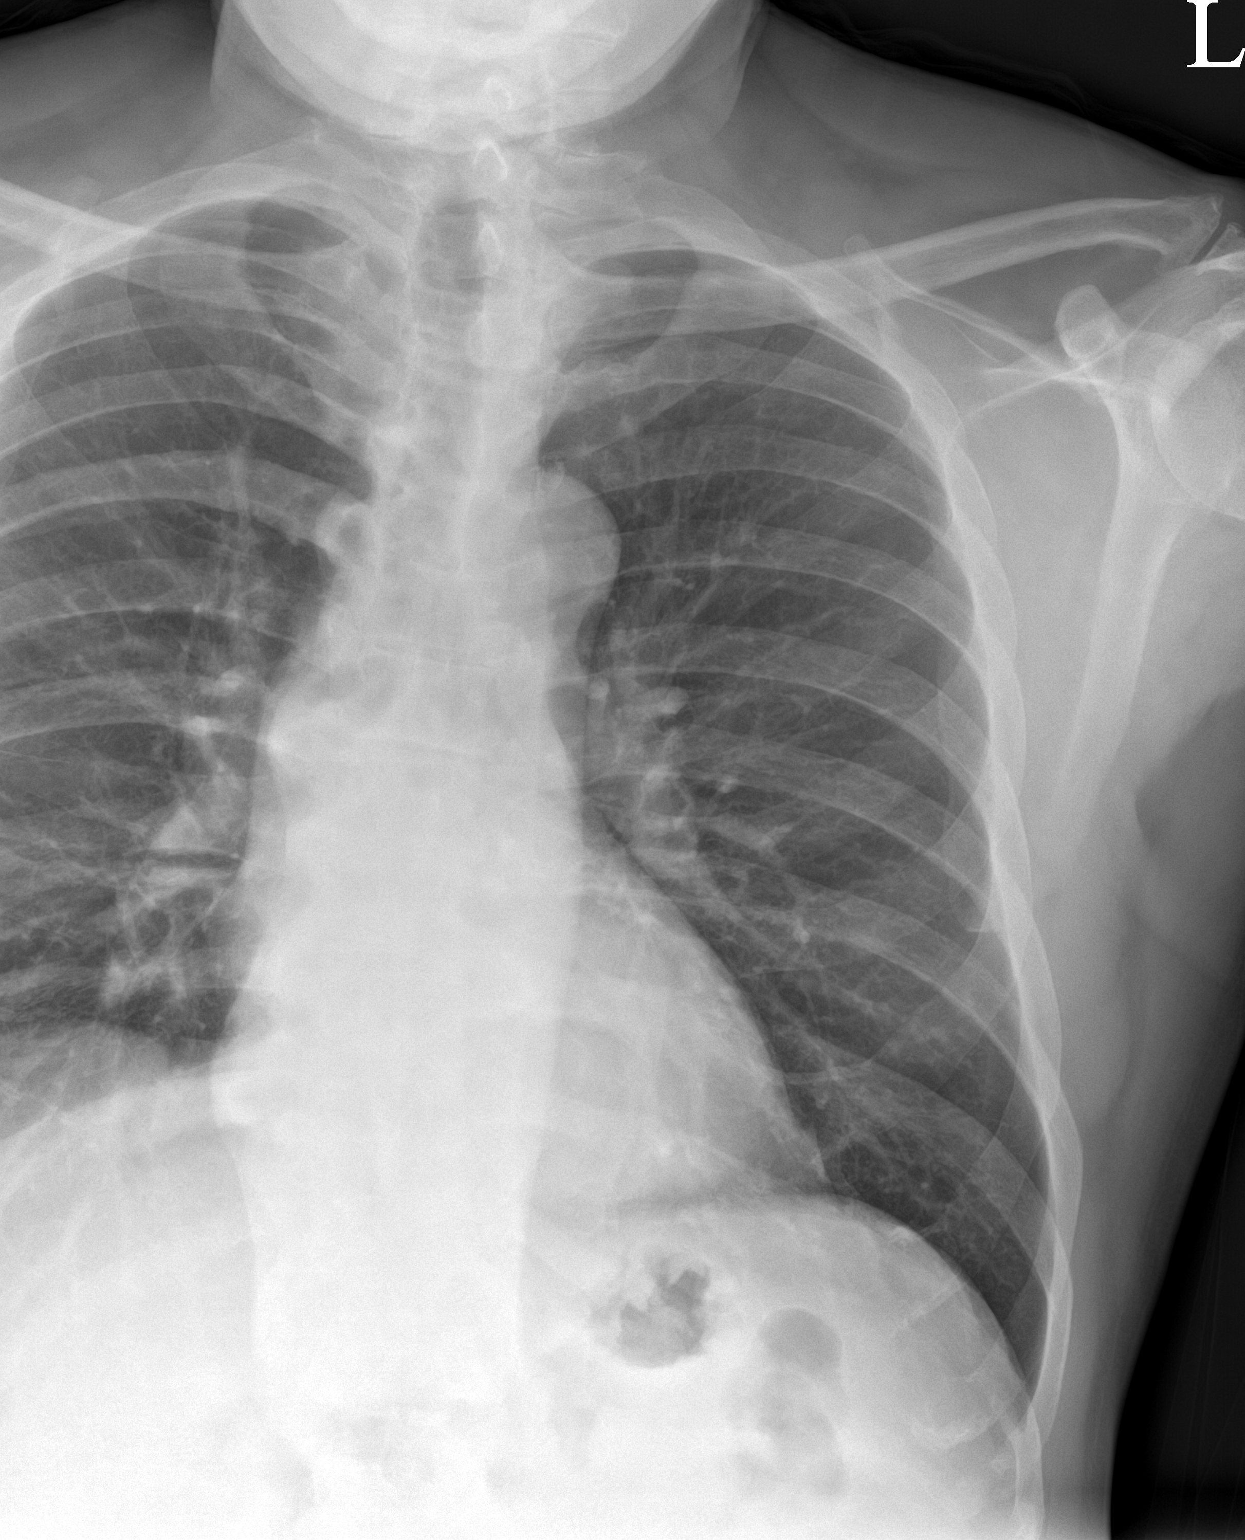

[3 of 3 positions shown; findings below may reference images not displayed]

FINDINGS: The heart size and mediastinal contours are within normal limits.
Both lungs are clear. Degenerative changes of the spine
IMPRESSION: No active cardiopulmonary disease.

## 2022-05-09 ENCOUNTER — Ambulatory Visit
Admission: RE | Admit: 2022-05-09 | Discharge: 2022-05-09 | Disposition: A | Discharge status: Home / Self Care | Payer: Self-pay | Source: Ambulatory Visit | Attending: Family Medicine | Admitting: Family Medicine

## 2022-05-09 VITALS — BP 187/102 | HR 95 | Temp 98.0°F | Resp 18

## 2022-05-09 DIAGNOSIS — R197 Diarrhea, unspecified: Secondary | ICD-10-CM

## 2022-05-09 MED ORDER — CIPROFLOXACIN HCL 500 MG PO TABS: 500.0000 mg | ORAL_TABLET | Freq: Two times a day (BID) | ORAL | 0 refills | Status: AC

## 2022-05-09 NOTE — ED Provider Notes (Signed)
[name]    CSN: [id number] Arrival date & time: 05/09/22  1102      History   Chief Complaint Chief Complaint  Patient presents with   Diarrhea   Nausea    HPI [name] is a 57 y.o. male.   Patient is here for 6 days of stomach issues.  He thought it was food poisoning.  Started on Wednesday.  On work he ate some fried chicken, and after that had symptoms.  Started with diarrhea.  At first he felt nauseated, but that resolved.  Now he continues with diarrhea.  Anytime he eats/drinks anything it goes right though him.  He has mild gas, cramping, but no pain per se.  No fevers/chills.  Loose, watery, no blood.   He is able to eat/drink okay but it just goes right through him.  He has been taking pepto without help.   His bp is elevated today.  No headaches or dizziness.  He did not take his bp medications this morning.        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 Authorizing Provider  amLODipine (NORVASC) 10 MG tablet Take 1 tablet (10 mg total) by mouth daily. 10/14/21   Hoyt Koch, MD  azelastine (ASTELIN) 0.1 % nasal spray Place into both nostrils 2 (two) times daily. Use in each nostril as directed    [provider]  cyclobenzaprine (FLEXERIL) 10 MG tablet Take 1 tablet (10 mg total) by mouth 2 (two) times daily as needed for muscle spasms. 01/16/22   Wilnette Kales, PA  ibuprofen (ADVIL) 600 MG tablet Take 1 tablet (600 mg total) by mouth every 6 (six) hours as needed. 01/16/22   Wilnette Kales, PA  indapamide (LOZOL) 1.25 MG tablet Take 1 tablet (1.25 mg total) by mouth daily. 10/14/21   Hoyt Koch, MD  ondansetron (ZOFRAN ODT) 8 MG disintegrating tablet Take 1 tablet (8 mg total) by mouth every 8 (eight) hours as needed for nausea or vomiting. 01/13/21   Dorie Rank, MD  rosuvastatin (CRESTOR) 10 MG tablet Take 1 tablet (10 mg total) by mouth daily. 10/14/21   Hoyt Koch, MD  tadalafil (CIALIS) 5 MG tablet Take 1 tablet (5 mg total) by mouth daily. 10/14/21   Hoyt Koch, MD    Family History Family History  Problem Relation Age of Onset   Cataracts Mother  Dementia Mother    Hypertension Father    Kidney disease Father    Glaucoma Maternal Grandmother    Hypertension Paternal Grandfather    Other Brother        dialysis   Other Brother        dialysis   Colon cancer Neg Hx    Esophageal cancer Neg Hx    Rectal cancer Neg Hx    Stomach cancer Neg Hx     Social History Social History   Tobacco Use   Smoking status: Former    Types: Cigars    Quit date: 06/09/2012    Years since quitting: 9.9   Smokeless tobacco: Never   Tobacco comments:    occasional cigar  Vaping Use   Vaping Use: Never used  Substance Use Topics   Alcohol use: Yes    Alcohol/week: 10.0 standard drinks of alcohol    Types: 10 Cans of beer per week     Comment: stopped June2021   Drug use: No     Allergies   Doxycycline and Sildenafil   Review of Systems Review of Systems  Constitutional: Negative.   HENT: Negative.    Respiratory: Negative.    Cardiovascular: Negative.   Gastrointestinal:  Positive for diarrhea.  Musculoskeletal: Negative.   Psychiatric/Behavioral: Negative.       Physical Exam Triage Vital Signs ED Triage Vitals [05/09/22 1144]  Enc Vitals Group     BP (!) 187/102     Pulse Rate 95     Resp 18     Temp 98 F (36.7 C)     Temp Source Oral     SpO2 99 %     Weight      Height      Head Circumference      Peak Flow      Pain Score 1     Pain Loc      Pain Edu?      Excl. in [place]?    No data found.  Updated Vital Signs BP (!) 187/102 (BP Location: Right Arm)   Pulse 95   Temp 98 F (36.7 C) (Oral)   Resp 18   SpO2 99%   Visual Acuity Right Eye Distance:   Left Eye Distance:   Bilateral Distance:    Right Eye Near:   Left Eye Near:    Bilateral Near:     Physical Exam Constitutional:      Appearance: Normal appearance.  Cardiovascular:     Rate and Rhythm: Normal rate and regular rhythm.  Pulmonary:     Effort: Pulmonary effort is normal.     Breath sounds: Normal breath sounds.  Abdominal:     General: Bowel sounds are normal.     Palpations: Abdomen is soft.     Tenderness: There is abdominal tenderness. There is no guarding or rebound.     Comments: Mild epigastric tenderness  Musculoskeletal:     Cervical back: Normal range of motion.  Neurological:     General: No focal deficit present.     Mental Status: He is alert.  Psychiatric:        Mood and Affect: Mood normal.      UC Treatments / Results  Labs (all labs ordered are listed, but only abnormal results are displayed) Labs Reviewed - No data to display  EKG   Radiology No results found.  Procedures Procedures (including critical care time)  Medications Ordered in UC Medications - No data  to  display  Initial Impression / Assessment and Plan / UC Course  I have reviewed the triage vital signs and the nursing notes.  Pertinent labs & imaging results that were available during my care of the patient were reviewed by me and considered in my medical decision making (see chart for details).   Final Clinical Impressions(s) / UC Diagnoses   Final diagnoses:  Diarrhea of presumed infectious origin     Discharge Instructions      You were seen today for diarrhea.  I have sent out an antibiotic today to take twice/day x 5 days.  Please get plenty of fluids and eat a bland diet.  Return if you are not improving by next week, or if symptoms worsen.     ED Prescriptions     Medication Sig Dispense Auth. Provider   ciprofloxacin (CIPRO) 500 MG tablet Take 1 tablet (500 mg total) by mouth 2 (two) times daily for 5 days. 10 tablet Rondel Oh, MD      PDMP not reviewed this encounter.   Rondel Oh, MD 05/09/22 [phone]

## 2022-05-09 NOTE — ED Triage Notes (Signed)
Pt presents with nausea and diarrhea X 5 days.

## 2022-05-09 NOTE — Discharge Instructions (Signed)
You were seen today for diarrhea.  I have sent out an antibiotic today to take twice/day x 5 days.  Please get plenty of fluids and eat a bland diet.  Return if you are not improving by next week, or if symptoms worsen.

## 2022-06-08 ENCOUNTER — Ambulatory Visit
Admission: EM | Admit: 2022-06-08 | Discharge: 2022-06-08 | Disposition: A | Discharge status: Home / Self Care | Payer: Self-pay | Attending: Physician Assistant | Admitting: Physician Assistant

## 2022-06-08 ENCOUNTER — Encounter: Payer: Self-pay | Admitting: Emergency Medicine

## 2022-06-08 DIAGNOSIS — T7840XA Allergy, unspecified, initial encounter: Secondary | ICD-10-CM

## 2022-06-08 DIAGNOSIS — J069 Acute upper respiratory infection, unspecified: Secondary | ICD-10-CM

## 2022-06-08 MED ORDER — PREDNISONE 10 MG PO TABS: 10.0000 mg | ORAL_TABLET | Freq: Three times a day (TID) | ORAL | 0 refills | Status: DC

## 2022-06-08 MED ORDER — LORATADINE 10 MG PO TABS: 10.0000 mg | ORAL_TABLET | Freq: Every day | ORAL | 0 refills | Status: DC

## 2022-06-08 MED ORDER — FLUTICASONE PROPIONATE 50 MCG/ACT NA SUSP: 2.0000 | Freq: Every day | NASAL | 0 refills | Status: DC

## 2022-06-08 NOTE — ED Provider Notes (Signed)
[name]    CSN: [id number] Arrival date & time: 06/08/22  1026      History   Chief Complaint Chief Complaint  Patient presents with   Nasal Congestion   Eye Drainage    HPI [name] is a 57 y.o. male.   57 year old male presents with watery itchy eyes, nasal congestion.  Patient indicates for the past 2 weeks he has been having persistent upper respiratory congestion with sinus congestion frontal and maxillary, rhinitis which is mainly been clear.  He indicates he is blowing his nose quite frequently all throughout the day with production mainly being clear but occasional has some blood-tinged component.  Also indicates he is having bilateral watering, drainage, itching with mild swelling of the upper eyelids bilaterally.  Patient also indicates he is having some ear congestion.  He does indicate having mild cough without congestion but mainly at night when he tries to lay down is when he has the coughing episodes.  He is without fever or chills.  He indicates he has been using OTC DayQuil/NyQuil with minimal improvement of his symptoms.  He is tolerating fluids well.  Patient indicates that he has occasional seasonal allergies but not to this degree.     Past Medical History:  Diagnosis Date   Allergy    Arthritis    Compartment syndrome ([place])    right arm    GERD (gastroesophageal reflux disease)    Hypertension    self reported    Patient Active Problem List   Diagnosis Date Noted   Unintentional weight loss 10/15/2021   Encounter for general adult medical examination with abnormal findings 12/28/2020   Loud snoring 12/28/2020   Idiopathic chronic gout of multiple sites without tophus 12/28/2020   Elevated ferritin 02/17/2020   Essential hypertension 10/02/2019   Hyperlipidemia 10/02/2019   Hypersomnia with sleep apnea 06/09/2016   Excessive daytime sleepiness 06/09/2016   Parasomnia, 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 Authorizing Provider  fluticasone (FLONASE) 50 MCG/ACT nasal spray Place 2 sprays into both nostrils daily. 06/08/22  Yes Nyoka Lint, PA-C  loratadine (CLARITIN) 10 MG tablet Take 1 tablet (10 mg total) by mouth daily. 06/08/22  Yes Nyoka Lint, PA-C  predniSONE (DELTASONE) 10 MG tablet Take 1 tablet (10 mg total) by mouth in the morning, at noon, and at bedtime. 06/08/22  Yes Nyoka Lint, PA-C  amLODipine (NORVASC) 10 MG tablet Take 1 tablet (10 mg total) by mouth daily. 10/14/21   Hoyt Koch, MD  azelastine (ASTELIN) 0.1 % nasal spray Place into both nostrils 2 (two) times daily. Use in each nostril as directed    [provider]  cyclobenzaprine (FLEXERIL) 10 MG tablet Take 1 tablet (10 mg total) by mouth 2 (two) times daily as needed for muscle spasms. 01/16/22   Wilnette Kales, PA  ibuprofen (ADVIL) 600 MG tablet Take 1 tablet (600 mg total) by mouth every 6 (six) hours as needed. 01/16/22   Wilnette Kales, PA  indapamide (LOZOL) 1.25 MG tablet Take 1 tablet (1.25 mg total) by mouth daily.  10/14/21   Hoyt Koch, MD  ondansetron (ZOFRAN ODT) 8 MG disintegrating tablet Take 1 tablet (8 mg total) by mouth every 8 (eight) hours as needed for nausea or vomiting. 01/13/21   Dorie Rank, MD  rosuvastatin (CRESTOR) 10 MG tablet Take 1 tablet (10 mg total) by mouth daily. 10/14/21   Hoyt Koch, MD  tadalafil (CIALIS) 5 MG tablet Take 1 tablet (5 mg total) by mouth daily. 10/14/21   Hoyt Koch, MD    Family History Family History  Problem Relation Age of Onset   Cataracts  Mother    Dementia Mother    Hypertension Father    Kidney disease Father    Glaucoma Maternal Grandmother    Hypertension Paternal Grandfather    Other Brother        dialysis   Other Brother        dialysis   Colon cancer Neg Hx    Esophageal cancer Neg Hx    Rectal cancer Neg Hx    Stomach cancer Neg Hx     Social History Social History   Tobacco Use   Smoking status: Former    Types: Cigars    Quit date: 06/09/2012    Years since quitting: 10.0   Smokeless tobacco: Never   Tobacco comments:    occasional cigar  Vaping Use   Vaping Use: Never used  Substance Use Topics   Alcohol use: Yes    Alcohol/week: 10.0 standard drinks of alcohol    Types: 10 Cans of beer per week    Comment: stopped June2021   Drug use: No     Allergies   Doxycycline and Sildenafil   Review of Systems Review of Systems  HENT:  Positive for postnasal drip, rhinorrhea and sinus pressure.   Respiratory:  Positive for cough.      Physical Exam Triage Vital Signs ED Triage Vitals  Enc Vitals Group     BP 06/08/22 1050 124/82     Pulse Rate 06/08/22 1050 79     Resp 06/08/22 1050 18     Temp 06/08/22 1050 98.4 F (36.9 C)     Temp Source 06/08/22 1050 Oral     SpO2 06/08/22 1050 98 %     Weight --      Height --      Head Circumference --      Peak Flow --      Pain Score 06/08/22 1049 0     Pain Loc --      Pain Edu? --      Excl. in [place]? --    No data found.  Updated Vital Signs BP 124/82 (BP Location: Left Arm)   Pulse 79   Temp 98.4 F (36.9 C) (Oral)   Resp 18   SpO2 98%   Visual Acuity Right Eye Distance:   Left Eye Distance:   Bilateral Distance:    Right Eye Near:   Left Eye Near:    Bilateral Near:     Physical Exam Constitutional:      Appearance: Normal appearance.  HENT:     Right Ear: Ear canal normal. Tympanic membrane is injected.     Left Ear: Ear canal normal. Tympanic membrane is injected.     Mouth/Throat:     Mouth: Mucous membranes  are moist.     Pharynx: Oropharynx is clear. Uvula midline.  Eyes:     Extraocular Movements: Extraocular movements intact.     Conjunctiva/sclera:  Conjunctivae normal.     Comments: Bilateral upper eyelids with mild swelling present.  Lower eyelids have minimal amount of swelling present, no discoloration or redness present  Cardiovascular:     Rate and Rhythm: Normal rate and regular rhythm.     Heart sounds: Normal heart sounds.  Pulmonary:     Effort: Pulmonary effort is normal.     Breath sounds: Normal breath sounds and air entry. No wheezing, rhonchi or rales.  Lymphadenopathy:     Cervical: No cervical adenopathy.  Neurological:     Mental Status: He is alert.      UC Treatments / Results  Labs (all labs ordered are listed, but only abnormal results are displayed) Labs Reviewed - No data to display  EKG   Radiology No results found.  Procedures Procedures (including critical care time)  Medications Ordered in UC Medications - No data to display  Initial Impression / Assessment and Plan / UC Course  I have reviewed the triage vital signs and the nursing notes.  Pertinent labs & imaging results that were available during my care of the patient were reviewed by me and considered in my medical decision making (see chart for details).    Plan: The diagnosis will be treated with the following: 1.  Upper respiratory infection: Claritin 10 mg once daily to treat allergy type symptoms. 2.  Allergies: A.  Flonase nasal spray, 2 sprays each nostril once daily to control allergies and congestion. B.  Prednisone 10 mg 3 times a day for 5 days only to help reduce the allergy and inflammatory component. 3.  Patient advised follow-up PCP return to urgent care as needed. Final Clinical Impressions(s) / UC Diagnoses   Final diagnoses:  Acute upper respiratory infection  Allergy, initial encounter     Discharge Instructions      Advised to use the Flonase nasal  spray, 2 sprays each nostril once a day on a regular basis until completed. Advised take Claritin 10 mg once daily as this will help control the congestion and drainage. Advised take prednisone 10 mg 3 times a day for 5 days only as this will help reduce the acute inflammatory and allergy component.  Advised follow-up PCP return to urgent care if symptoms fail to improve.    ED Prescriptions     Medication Sig Dispense Auth. Provider   predniSONE (DELTASONE) 10 MG tablet Take 1 tablet (10 mg total) by mouth in the morning, at noon, and at bedtime. 15 tablet Nyoka Lint, PA-C   loratadine (CLARITIN) 10 MG tablet Take 1 tablet (10 mg total) by mouth daily. 30 tablet Nyoka Lint, PA-C   fluticasone [facility]) 50 MCG/ACT nasal spray Place 2 sprays into both nostrils daily. 9.9 mL Nyoka Lint, PA-C      PDMP not reviewed this encounter.   Nyoka Lint, PA-C 06/08/22 1110

## 2022-06-08 NOTE — ED Triage Notes (Signed)
Pt presents with runny nose and watery eyes Xs 1-2 weeks. States has used OTC medications with no relief.

## 2022-06-08 NOTE — Discharge Instructions (Signed)
Advised to use the Flonase nasal spray, 2 sprays each nostril once a day on a regular basis until completed. Advised take Claritin 10 mg once daily as this will help control the congestion and drainage. Advised take prednisone 10 mg 3 times a day for 5 days only as this will help reduce the acute inflammatory and allergy component.  Advised follow-up PCP return to urgent care if symptoms fail to improve.

## 2022-07-05 ENCOUNTER — Ambulatory Visit: Payer: Self-pay | Admitting: Internal Medicine

## 2022-07-07 ENCOUNTER — Encounter: Payer: Self-pay | Admitting: Internal Medicine

## 2022-07-07 ENCOUNTER — Ambulatory Visit (INDEPENDENT_AMBULATORY_CARE_PROVIDER_SITE_OTHER): Payer: Commercial Managed Care - PPO | Admitting: Internal Medicine

## 2022-07-07 VITALS — BP 170/98 | HR 98 | Temp 98.7°F | Ht 67.0 in | Wt 180.0 lb

## 2022-07-07 DIAGNOSIS — K4020 Bilateral inguinal hernia, without obstruction or gangrene, not specified as recurrent: Secondary | ICD-10-CM

## 2022-07-07 NOTE — Assessment & Plan Note (Signed)
Right with symptoms and needs surgical intervention. Referral to surgery. He has been taken out of work due to pain and bulging with lifting and lifts heavy things at work routinely.

## 2022-07-07 NOTE — Patient Instructions (Signed)
You can upload the forms to mychart or drop off at the office.   We will get you in with the surgeon.

## 2022-07-07 NOTE — Progress Notes (Signed)
   Subjective:   Patient ID: [name], male    DOB: 1965/07/08, 57 y.o.   MRN: [id number]  HPI The patient is a 57 YO man coming in for relatively new right inguinal hernia. Seen at work through [employer] but they could not address and detected bilateral hernia. Pain with right and bulging out at times. He is able to reduce. This is painful and getting more often. He was taken out of work as he does significant lifting at work and this is triggering symptoms.   Review of Systems  Constitutional: Negative.   HENT: Negative.    Eyes: Negative.   Respiratory:  Negative for cough, chest tightness and shortness of breath.   Cardiovascular:  Negative for chest pain, palpitations and leg swelling.  Gastrointestinal:  Negative for abdominal distention, abdominal pain, constipation, diarrhea, nausea and vomiting.  Genitourinary:        Bulging  Musculoskeletal:  Positive for arthralgias.  Skin: Negative.   Neurological: Negative.   Psychiatric/Behavioral: Negative.      Objective:  Physical Exam Constitutional:      Appearance: He is well-developed.  HENT:     Head: Normocephalic and atraumatic.  Cardiovascular:     Rate and Rhythm: Normal rate and regular rhythm.  Pulmonary:     Effort: Pulmonary effort is normal. No respiratory distress.     Breath sounds: Normal breath sounds. No wheezing or rales.  Abdominal:     General: Bowel sounds are normal. There is no distension.     Palpations: Abdomen is soft.     Tenderness: There is no abdominal tenderness. There is no rebound.  Genitourinary:    Comments: Right and left inguinal hernia not incarcerated Musculoskeletal:     Cervical back: Normal range of motion.  Skin:    General: Skin is warm and dry.  Neurological:     Mental Status: He is alert and oriented to person, place, and time.     Coordination: Coordination normal.     Vitals:   07/07/22 1030 07/07/22 1032  BP: (!) 170/98 (!) 170/98  Pulse: 98   Temp:  98.7 F (37.1 C)   TempSrc: Oral   SpO2: 98%   Weight: 180 lb (81.6 kg)   Height: 5\' 7"  (1.702 m)     Assessment & Plan:

## 2022-07-12 ENCOUNTER — Telehealth: Payer: Self-pay | Admitting: Internal Medicine

## 2022-07-12 ENCOUNTER — Encounter: Payer: Self-pay | Admitting: Internal Medicine

## 2022-07-12 NOTE — Telephone Encounter (Signed)
Short term disability forms for surgery are up front in Dr. Yetta Barre' box.

## 2022-07-15 NOTE — Telephone Encounter (Signed)
Patient called concerning forms - please call him on Monday and let him know if they have been completed.  Phone:  [phone]

## 2022-07-15 NOTE — Telephone Encounter (Signed)
Spoke with patient and he stated that he has not been scheduled for his surgery just yet but will call them again today. He also mentioned that he has been out of work since 06/21/2022 for this hernia.

## 2022-07-20 NOTE — Telephone Encounter (Signed)
Patient called and needs this paperwork sent to the insurance company.  Patient would like a phone call from CMA - [phone]

## 2022-07-21 NOTE — Telephone Encounter (Signed)
Pt called and stated he needs the status of his paperwork as he has been waiting for call back. [phone].

## 2022-07-25 NOTE — Telephone Encounter (Signed)
Patient would like Dr. Lawana Chambers nurse to give him a call.

## 2022-07-25 NOTE — Telephone Encounter (Signed)
Patient called back and needs his forms filled out - pleae call patient.

## 2022-07-26 NOTE — Telephone Encounter (Signed)
Form has been placed up front for pt to pick and also pt has been called and informed

## 2022-07-28 NOTE — Telephone Encounter (Signed)
[employer] called and said that the forms were not received that were faxed on Monday.  Please refax to [phone]

## 2022-07-29 NOTE — Telephone Encounter (Signed)
Patient called back and said that this needs to be sent to this email:  [email or web address]

## 2022-07-29 NOTE — Telephone Encounter (Signed)
Pt called wanted a call back needing forms to be faxed back over because the insurance copy stating they never received the forms. Pt also stated its urgent.

## 2022-07-29 NOTE — Telephone Encounter (Signed)
I have emailed and also fax over again the forms.

## 2022-08-01 NOTE — Telephone Encounter (Signed)
I have emailed his over to the email provided.

## 2022-08-01 NOTE — Telephone Encounter (Signed)
A representative from [employer] said they still have not received the form. She gave her email to have it sent there. Please send the form to [email or web address].

## 2022-08-10 ENCOUNTER — Telehealth: Payer: Self-pay | Admitting: Internal Medicine

## 2022-08-10 NOTE — Telephone Encounter (Signed)
Just spoke with the patient about this matter and I wasn't understanding what he was asking for until I called "[place]" the HR lady from his job and asked her what exactly were they needing from [place]? She stated that they need a letter from the doctor stating hs/she is taking him out of work until patient has his surgery done. [place] fax number is [phone]

## 2022-08-10 NOTE — Telephone Encounter (Signed)
Patient called and said his job is requesting a note saying he needs to be out of work until he has surgery. Surgery referral was made by Dr. Okey Dupre. He would like a call back from a nurse to discuss. Best callback is [phone].

## 2022-08-11 NOTE — Telephone Encounter (Signed)
Spoke with patient and informed him that he needs to reach out to the surgeon and ask them for the letter because they will be more appropriate for this

## 2022-08-11 NOTE — Telephone Encounter (Signed)
Since he saw the surgeon on 08/04/22 he would need to get that note from them.

## 2022-08-15 NOTE — Telephone Encounter (Signed)
Fine for letter with seen date and out of work until date of visit with Careers adviser

## 2022-08-15 NOTE — Telephone Encounter (Signed)
Patient still states that he needs a note stating when I can come back to work.  The note just needs to say that he is out of work until he see's the Careers adviser.  Please call patient at:  [phone]

## 2022-08-15 NOTE — Telephone Encounter (Signed)
Spoke with patient and he states that the job is still needing a note to them saying that he was seen in office with you on 4/11 until 5/9 when he went to see the surgeon. I explained to the patient anything after that date dr crawford will no longer be able to handle it will ave to be from the surgeon. Please advise if the letter is appropriate.

## 2022-08-16 ENCOUNTER — Encounter: Payer: Self-pay | Admitting: Internal Medicine

## 2022-08-16 NOTE — Telephone Encounter (Signed)
Letter has been done and fax over to Patient job for HR to review and also made a copy for the patient to come pick up

## 2022-09-07 NOTE — Telephone Encounter (Signed)
Patient called and asked for the letter to be faxed to his insurance [employer] as well. He said he will call back with their fax number. Best callback for patient is [phone].

## 2022-09-07 NOTE — Telephone Encounter (Signed)
Noted  

## 2022-10-20 ENCOUNTER — Ambulatory Visit: Payer: Self-pay | Admitting: General Surgery

## 2022-10-27 NOTE — Patient Instructions (Signed)
SURGICAL WAITING ROOM VISITATION Patients having surgery or a procedure may have no more than 2 support people in the waiting area - these visitors may rotate in the visitor waiting room.   Due to an increase in RSV and influenza rates and associated hospitalizations, children ages 30 and under may not visit patients in [facility] hospitals. If the patient needs to stay at the hospital during part of their recovery, the visitor guidelines for inpatient rooms apply.  PRE-OP VISITATION  Pre-op nurse will coordinate an appropriate time for 1 support person to accompany the patient in pre-op.  This support person may not rotate.  This visitor will be contacted when the time is appropriate for the visitor to come back in the pre-op area.  Please refer to the [facility] website for the visitor guidelines for Inpatients (after your surgery is over and you are in a regular room).  You are not required to quarantine at this time prior to your surgery. However, you must do this: Hand Hygiene often Do NOT share personal items Notify your provider if you are in close contact with someone who has COVID or you develop fever 100.4 or greater, new onset of sneezing, cough, sore throat, shortness of breath or body aches.  If you test positive for Covid or have been in contact with anyone that has tested positive in the last 10 days please notify you surgeon.    Your procedure is scheduled on:  Tuesday  November 08, 2022  Report to [facility] Main Entrance: [place] entrance where the [employer] is available.   Report to admitting at:  08:45   AM  Call this number if you have any questions or problems the morning of surgery [phone]  Do not eat food after Midnight the night prior to your surgery/procedure.  After Midnight you may have the following liquids until   08:00   AM  DAY OF SURGERY  Clear Liquid Diet Water Black Coffee (sugar ok, NO MILK/CREAM OR CREAMERS)  Tea (sugar ok, NO  MILK/CREAM OR CREAMERS) regular and decaf                             Plain Jell-O  with no fruit (NO RED)                                           Fruit ices (not with fruit pulp, NO RED)                                     Popsicles (NO RED)                                                                  Juice: NO CITRUS JUICES: only apple, WHITE grape, WHITE cranberry Sports drinks like Gatorade or Powerade (NO RED)                FOLLOW BOWEL PREP AND ANY ADDITIONAL PRE OP INSTRUCTIONS YOU RECEIVED FROM YOUR SURGEON'S OFFICE!!!   Oral Hygiene  is also important to reduce your risk of infection.        Remember - BRUSH YOUR TEETH THE MORNING OF SURGERY WITH YOUR REGULAR TOOTHPASTE  Do NOT smoke after Midnight the night before surgery.   STOP TAKING all Vitamins, Herbs and supplements 1 week before your surgery.   Take ONLY these medicines the morning of surgery with A SIP OF WATER: amlodipine                     You may not have any metal on your body including jewelry, and body piercing  Do not wear  lotions, powders,  cologne, or deodorant  Men may shave face and neck.  Contacts, Hearing Aids, dentures or bridgework may not be worn into surgery. DENTURES WILL BE REMOVED PRIOR TO SURGERY PLEASE DO NOT APPLY "Poly grip" OR ADHESIVES!!!    Patients discharged on the day of surgery will not be allowed to drive home.  Someone NEEDS to stay with you for the first 24 hours after anesthesia.  Do not bring your home medications to the hospital. The Pharmacy will dispense medications listed on your medication list to you during your admission in the Hospital.   Please read over the following fact sheets you were given: IF YOU HAVE QUESTIONS ABOUT YOUR PRE-OP INSTRUCTIONS, PLEASE CALL [phone]   [facility] - Preparing for Surgery Before surgery, you can play an important role.  Because skin is not sterile, your skin needs to be as free of germs as possible.  You can reduce  the number of germs on your skin by washing with CHG (chlorahexidine gluconate) soap before surgery.  CHG is an antiseptic cleaner which kills germs and bonds with the skin to continue killing germs even after washing. Please DO NOT use if you have an allergy to CHG or antibacterial soaps.  If your skin becomes reddened/irritated stop using the CHG and inform your nurse when you arrive at Short Stay. Do not shave (including legs and underarms) for at least 48 hours prior to the first CHG shower.  You may shave your face/neck.  Please follow these instructions carefully:  1.  Shower with CHG Soap the night before surgery and the  morning of surgery.  2.  If you choose to wash your hair, wash your hair first as usual with your normal  shampoo.  3.  After you shampoo, rinse your hair and body thoroughly to remove the shampoo.                             4.  Use CHG as you would any other liquid soap.  You can apply chg directly to the skin and wash.  Gently with a scrungie or clean washcloth.  5.  Apply the CHG Soap to your body ONLY FROM THE NECK DOWN.   Do not use on face/ open                           Wound or open sores. Avoid contact with eyes, ears mouth and genitals (private parts).                       Wash face,  Genitals (private parts) with your normal soap.             6.  Wash thoroughly, paying special attention to the area where  your  surgery  will be performed.  7.  Thoroughly rinse your body with warm water from the neck down.  8.  DO NOT shower/wash with your normal soap after using and rinsing off the CHG Soap.            9.  Pat yourself dry with a clean towel.            10.  Wear clean pajamas.            11.  Place clean sheets on your bed the night of your first shower and do not  sleep with pets.  ON THE DAY OF SURGERY : Do not apply any lotions/deodorants the morning of surgery.  Please wear clean clothes to the hospital/surgery center.     FAILURE TO FOLLOW THESE  INSTRUCTIONS MAY RESULT IN THE CANCELLATION OF YOUR SURGERY  PATIENT SIGNATURE_________________________________  NURSE SIGNATURE__________________________________  ________________________________________________________________________

## 2022-10-27 NOTE — Progress Notes (Signed)
COVID Vaccine received:  []  No []  Yes Date of any COVID positive Test in last 90 days:  PCP - Sanda Linger, MD [phone] (Work) [phone] (Fax)  Cardiologist - none  Chest x-ray - 06-21-2022  2v  EPIC EKG -  Will do at PST Stress Test -  ECHO -  Cardiac Cath -   PCR screen: []  Ordered & Completed           []   No Order but Needs PROFEND           [x]   N/A for this surgery  Surgery Plan:  [x]  Ambulatory                            []  Outpatient in bed                            []  Admit  Anesthesia:    [x]  General  []  Spinal                           []   Choice []   MAC  Bowel Prep - [x]  No  []   Yes ______  Pacemaker / ICD device [x]  No []  Yes   Spinal Cord Stimulator:[x]  No []  Yes       History of Sleep Apnea? [x]  No []  Yes   CPAP used?- [x]  No []  Yes    Does the patient monitor blood sugar?          []  No []  Yes  [x]  N/A  Patient has: [x]  NO Hx DM   []  Pre-DM                 []  DM1  []   DM2 Had Normal A1c of 5.7 on 03-09-2022  Blood Thinner / Instructions:  none Aspirin Instructions:  none  ERAS Protocol Ordered: []  No  [x]  Yes    PRE-SURGERY []  ENSURE  []  G2  [x]  No Drink Ordered Patient is to be NPO after: 08:45  Comments:   Activity level: Patient is able / unable to climb a flight of stairs without difficulty; []  No CP  []  No SOB, but would have ___   Patient can / can not perform ADLs without assistance.   Anesthesia review: HTN, gout, GERD,   Patient denies shortness of breath, fever, cough and chest pain at PAT appointment.  Patient verbalized understanding and agreement to the Pre-Surgical Instructions that were given to them at this PAT appointment. Patient was also educated of the need to review these PAT instructions again prior to his/her surgery.I reviewed the appropriate phone numbers to call if they have any and questions or concerns.

## 2022-10-28 ENCOUNTER — Encounter (HOSPITAL_COMMUNITY)
Admission: RE | Admit: 2022-10-28 | Discharge: 2022-10-28 | Disposition: A | Discharge status: Home / Self Care | Payer: Medicaid Other | Source: Ambulatory Visit | Attending: Anesthesiology | Admitting: Anesthesiology

## 2022-10-28 DIAGNOSIS — I10 Essential (primary) hypertension: Secondary | ICD-10-CM

## 2022-10-28 DIAGNOSIS — Z01818 Encounter for other preprocedural examination: Secondary | ICD-10-CM

## 2022-11-01 ENCOUNTER — Encounter (HOSPITAL_COMMUNITY): Payer: Self-pay

## 2022-11-01 ENCOUNTER — Other Ambulatory Visit: Payer: Self-pay

## 2022-11-01 ENCOUNTER — Encounter (HOSPITAL_COMMUNITY)
Admission: RE | Admit: 2022-11-01 | Discharge: 2022-11-01 | Disposition: A | Discharge status: Home / Self Care | Payer: Medicaid Other | Source: Ambulatory Visit | Attending: General Surgery | Admitting: General Surgery

## 2022-11-01 DIAGNOSIS — Z01810 Encounter for preprocedural cardiovascular examination: Secondary | ICD-10-CM | POA: Diagnosis not present

## 2022-11-01 DIAGNOSIS — Z01818 Encounter for other preprocedural examination: Secondary | ICD-10-CM

## 2022-11-01 DIAGNOSIS — I10 Essential (primary) hypertension: Secondary | ICD-10-CM | POA: Insufficient documentation

## 2022-11-01 DIAGNOSIS — Z01812 Encounter for preprocedural laboratory examination: Secondary | ICD-10-CM | POA: Insufficient documentation

## 2022-11-01 LAB — BASIC METABOLIC PANEL
Anion gap: 11 (ref 5–15)
BUN: 7 mg/dL (ref 6–20)
CO2: 23 mmol/L (ref 22–32)
Calcium: 9 mg/dL (ref 8.9–10.3)
Chloride: 105 mmol/L (ref 98–111)
Creatinine, Ser: 0.84 mg/dL (ref 0.61–1.24)
GFR, Estimated: >60 mL/min (ref >60)
Glucose, Bld: 98 mg/dL (ref 70–99)
Potassium: 3.7 mmol/L (ref 3.5–5.1)
Sodium: 139 mmol/L (ref 135–145)

## 2022-11-01 LAB — CBC
HCT: 42.1 % (ref 39.0–52.0)
Hemoglobin: 13.9 g/dL (ref 13.0–17.0)
MCH: 30.4 pg (ref 26.0–34.0)
MCHC: 33 g/dL (ref 30.0–36.0)
MCV: 92.1 fL (ref 80.0–100.0)
Platelets: 239 10*3/uL (ref 150–400)
RBC: 4.57 MIL/uL (ref 4.22–5.81)
RDW: 14.7 % (ref 11.5–15.5)
WBC: 6.1 10*3/uL (ref 4.0–10.5)
nRBC: 0 % (ref 0.0–0.2)

## 2022-11-01 NOTE — Progress Notes (Signed)
For Short Stay: COVID SWAB appointment date:  Bowel Prep reminder:   For Anesthesia: PCP - Dr. Sanda Linger Cardiologist - N/A  Chest x-ray -  EKG - 11/01/22 Stress Test -  ECHO -  Cardiac Cath -  Pacemaker/ICD device last checked: Pacemaker orders received: Device Rep notified:  Spinal Cord Stimulator: N/A  Sleep Study - NO CPAP - NO  Fasting Blood Sugar - N/A Checks Blood Sugar _____ times a day Date and result of last Hgb A1c-  Last dose of GLP1 agonist- N/A GLP1 instructions:   Last dose of SGLT-2 inhibitors- N/A SGLT-2 instructions:   Blood Thinner Instructions: N/A Aspirin Instructions: Last Dose:  Activity level: Can go up a flight of stairs and activities of daily living without stopping and without chest pain and/or shortness of breath   Able to exercise without chest pain and/or shortness of breath  Anesthesia review: Hx: HTN,OSA(NO CPAP)  Patient denies shortness of breath, fever, cough and chest pain at PAT appointment   Patient verbalized understanding of instructions that were given to them at the PAT appointment. Patient was also instructed that they will need to review over the PAT instructions again at home before surgery.

## 2022-11-01 NOTE — Patient Instructions (Signed)
DUE TO COVID-19 ONLY TWO VISITORS  (aged 59 and older)  ARE ALLOWED TO COME WITH YOU AND STAY IN THE WAITING ROOM ONLY DURING PRE OP AND PROCEDURE.   **NO VISITORS ARE ALLOWED IN THE SHORT STAY AREA OR RECOVERY ROOM!!**  IF YOU WILL BE ADMITTED INTO THE HOSPITAL YOU ARE ALLOWED ONLY FOUR SUPPORT PEOPLE DURING VISITATION HOURS ONLY (7 AM -8PM)   The support person(s) must pass our screening, gel in and out, and wear a mask at all times, including in the patient's room. Patients must also wear a mask when staff or their support person are in the room. Visitors GUEST BADGE MUST BE WORN VISIBLY  One adult visitor may remain with you overnight and MUST be in the room by 8 P.M.     Your procedure is scheduled on: 11/08/22   Report to [facility] Main Entrance    Report to admitting at : 8:45 AM   Call this number if you have problems the morning of surgery [phone]   Eat a light diet the day before surgery.  Examples including soups, broths, toast, yogurt, mashed potatoes.  Things to avoid include carbonated beverages (fizzy beverages), raw fruits and raw vegetables, or beans.   If your bowels are filled with gas, your surgeon will have difficulty visualizing your pelvic organs which increases your surgical risks.  Do not eat food :After Midnight.   After Midnight you may have the following liquids until : 8:00 AM DAY OF SURGERY  Water Black Coffee (sugar ok, NO MILK/CREAM OR CREAMERS)  Tea (sugar ok, NO MILK/CREAM OR CREAMERS) regular and decaf                             Plain Jell-O (NO RED)                                           Fruit ices (not with fruit pulp, NO RED)                                     Popsicles (NO RED)                                                                  Juice: apple, WHITE grape, WHITE cranberry Sports drinks like Gatorade (NO RED)              FOLLOW ANY ADDITIONAL PRE OP INSTRUCTIONS YOU RECEIVED FROM YOUR SURGEON'S OFFICE!!!    Oral Hygiene is also important to reduce your risk of infection.                                    Remember - BRUSH YOUR TEETH THE MORNING OF SURGERY WITH YOUR REGULAR TOOTHPASTE  DENTURES WILL BE REMOVED PRIOR TO SURGERY PLEASE DO NOT APPLY "Poly grip" OR ADHESIVES!!!   Do NOT smoke after Midnight   Take these medicines the morning of surgery with A SIP OF  WATER: amlodipine.                              You may not have any metal on your body including hair pins, jewelry, and body piercing             Do not wear lotions, powders, perfumes/cologne, or deodorant              Men may shave face and neck.   Do not bring valuables to the hospital. [facility] IS NOT             RESPONSIBLE   FOR VALUABLES.   Contacts, glasses, or bridgework may not be worn into surgery.   Bring small overnight bag day of surgery.   DO NOT BRING YOUR HOME MEDICATIONS TO THE HOSPITAL. PHARMACY WILL DISPENSE MEDICATIONS LISTED ON YOUR MEDICATION LIST TO YOU DURING YOUR ADMISSION IN THE HOSPITAL!    Patients discharged on the day of surgery will not be allowed to drive home.  Someone NEEDS to stay with you for the first 24 hours after anesthesia.   Special Instructions: Bring a copy of your healthcare power of attorney and living will documents         the day of surgery if you haven't scanned them before.              Please read over the following fact sheets you were given: IF YOU HAVE QUESTIONS ABOUT YOUR PRE-OP INSTRUCTIONS PLEASE CALL [phone]    [facility] - Preparing for Surgery Before surgery, you can play an important role.  Because skin is not sterile, your skin needs to be as free of germs as possible.  You can reduce the number of germs on your skin by washing with CHG (chlorahexidine gluconate) soap before surgery.  CHG is an antiseptic cleaner which kills germs and bonds with the skin to continue killing germs even after washing. Please DO NOT use if you have an allergy to CHG or  antibacterial soaps.  If your skin becomes reddened/irritated stop using the CHG and inform your nurse when you arrive at Short Stay. Do not shave (including legs and underarms) for at least 48 hours prior to the first CHG shower.  You may shave your face/neck. Please follow these instructions carefully:  1.  Shower with CHG Soap the night before surgery and the  morning of Surgery.  2.  If you choose to wash your hair, wash your hair first as usual with your  normal  shampoo.  3.  After you shampoo, rinse your hair and body thoroughly to remove the  shampoo.                           4.  Use CHG as you would any other liquid soap.  You can apply chg directly  to the skin and wash                       Gently with a scrungie or clean washcloth.  5.  Apply the CHG Soap to your body ONLY FROM THE NECK DOWN.   Do not use on face/ open                           Wound or open sores. Avoid contact with eyes, ears mouth and genitals (private  parts).                       Wash face,  Genitals (private parts) with your normal soap.             6.  Wash thoroughly, paying special attention to the area where your surgery  will be performed.  7.  Thoroughly rinse your body with warm water from the neck down.  8.  DO NOT shower/wash with your normal soap after using and rinsing off  the CHG Soap.                9.  Pat yourself dry with a clean towel.            10.  Wear clean pajamas.            11.  Place clean sheets on your bed the night of your first shower and do not  sleep with pets. Day of Surgery : Do not apply any lotions/deodorants the morning of surgery.  Please wear clean clothes to the hospital/surgery center.  FAILURE TO FOLLOW THESE INSTRUCTIONS MAY RESULT IN THE CANCELLATION OF YOUR SURGERY PATIENT SIGNATURE_________________________________  NURSE SIGNATURE__________________________________  ________________________________________________________________________

## 2022-11-01 NOTE — Progress Notes (Signed)
   11/01/22 1313  OBSTRUCTIVE SLEEP APNEA  Have you ever been diagnosed with sleep apnea through a sleep study? No  Do you snore loudly (loud enough to be heard through closed doors)?  1  Do you often feel tired, fatigued, or sleepy during the daytime (such as falling asleep during driving or talking to someone)? 1  Has anyone observed you stop breathing during your sleep? 1  Do you have, or are you being treated for high blood pressure? 1  BMI more than 35 kg/m2? 0  Age > 50 (1-yes) 1  Neck circumference greater than:Male 16 inches or larger, Male 17inches or larger? 0  Male Gender (Yes=1) 1  Obstructive Sleep Apnea Score 6  Score 5 or greater  Results sent to PCP

## 2022-11-03 ENCOUNTER — Encounter (HOSPITAL_COMMUNITY): Payer: Self-pay

## 2022-11-03 ENCOUNTER — Encounter (HOSPITAL_COMMUNITY): Payer: Self-pay | Admitting: Medical

## 2022-11-08 ENCOUNTER — Encounter (HOSPITAL_COMMUNITY): Admission: RE | Payer: Self-pay | Source: Home / Self Care

## 2022-11-08 ENCOUNTER — Ambulatory Visit (HOSPITAL_COMMUNITY): Admission: RE | Admit: 2022-11-08 | Payer: Medicaid Other | Source: Home / Self Care | Admitting: General Surgery

## 2022-11-08 SURGERY — REPAIR, HERNIA, INGUINAL, BILATERAL, ROBOT-ASSISTED
Anesthesia: General

## 2023-01-09 ENCOUNTER — Other Ambulatory Visit: Payer: Self-pay

## 2023-01-09 ENCOUNTER — Emergency Department (HOSPITAL_COMMUNITY)
Admission: EM | Admit: 2023-01-09 | Discharge: 2023-01-09 | Disposition: A | Discharge status: Home / Self Care | Payer: Medicaid Other | Attending: Emergency Medicine | Admitting: Emergency Medicine

## 2023-01-09 ENCOUNTER — Emergency Department (HOSPITAL_COMMUNITY): Payer: Medicaid Other

## 2023-01-09 DIAGNOSIS — Z79899 Other long term (current) drug therapy: Secondary | ICD-10-CM | POA: Insufficient documentation

## 2023-01-09 DIAGNOSIS — M545 Low back pain, unspecified: Secondary | ICD-10-CM | POA: Diagnosis not present

## 2023-01-09 DIAGNOSIS — R109 Unspecified abdominal pain: Secondary | ICD-10-CM | POA: Diagnosis present

## 2023-01-09 DIAGNOSIS — I10 Essential (primary) hypertension: Secondary | ICD-10-CM | POA: Diagnosis not present

## 2023-01-09 LAB — URINALYSIS, ROUTINE W REFLEX MICROSCOPIC
Bilirubin Urine: NEGATIVE
Glucose, UA: NEGATIVE mg/dL
Hgb urine dipstick: NEGATIVE
Ketones, ur: NEGATIVE mg/dL
Leukocytes,Ua: NEGATIVE
Nitrite: NEGATIVE
Protein, ur: NEGATIVE mg/dL
Specific Gravity, Urine: 1.02 (ref 1.005–1.030)
pH: 6 (ref 5.0–8.0)

## 2023-01-09 MED ORDER — KETOROLAC TROMETHAMINE 30 MG/ML IJ SOLN
30.0000 mg | Freq: Once | INTRAMUSCULAR | Status: AC
  Administered 2023-01-09: 30 mg via INTRAVENOUS
  Filled 2023-01-09: qty 1

## 2023-01-09 MED ORDER — NAPROXEN 500 MG PO TABS: 500.0000 mg | ORAL_TABLET | Freq: Two times a day (BID) | ORAL | 0 refills | Status: DC

## 2023-01-09 MED ORDER — OXYCODONE HCL 5 MG PO TABS: 5.0000 mg | ORAL_TABLET | ORAL | 0 refills | Status: DC | PRN

## 2023-01-09 MED ORDER — CYCLOBENZAPRINE HCL 10 MG PO TABS
10.0000 mg | ORAL_TABLET | Freq: Once | ORAL | Status: AC
  Administered 2023-01-09: 10 mg via ORAL
  Filled 2023-01-09: qty 1

## 2023-01-09 MED ORDER — TIZANIDINE HCL 4 MG PO TABS: 4.0000 mg | ORAL_TABLET | Freq: Four times a day (QID) | ORAL | 0 refills | Status: DC | PRN

## 2023-01-09 NOTE — ED Provider Notes (Signed)
[place] [facility]ERGENCY DEPARTMENT AT [facility] Provider Note   CSN: [id number] Arrival date & time: 01/09/23  9562     History  Chief Complaint  Patient presents with   Flank Pain    R    [name] is a 57 y.o. male.  The history is provided by the patient.  Flank Pain  He has history of hypertension, kidney stones and complains of pain in the left flank which started last night.  Pain waxes and wanes.  It seems to be better when he stands up.  There is no radiation of pain and no nausea or vomiting.  He denies any urinary difficulty.  He denies any unusual bending or lifting or twisting.  He has been taking acetaminophen without relief.   Home Medications Prior to Admission medications   Medication Sig Start Date End Date Taking? Authorizing Provider  amLODipine (NORVASC) 10 MG tablet Take 1 tablet (10 mg total) by mouth daily. 10/14/21   Myrlene Broker, MD  azelastine (ASTELIN) 0.1 % nasal spray Place 1 spray into both nostrils 2 (two) times daily as needed for allergies or rhinitis. Use in each nostril as directed    [provider]  fluticasone (FLONASE) 50 MCG/ACT nasal spray Place 2 sprays into both nostrils daily. Patient taking differently: Place 2 sprays into both nostrils daily as needed for allergies. 06/08/22   Ellsworth Lennox, PA-C  ibuprofen (ADVIL) 200 MG tablet Take 400 mg by mouth every 6 (six) hours as needed for moderate pain.    [provider]  ondansetron (ZOFRAN ODT) 8 MG disintegrating tablet Take 1 tablet (8 mg total) by mouth every 8 (eight) hours as needed for nausea or vomiting. 01/13/21   Linwood Dibbles, MD      Allergies    Sildenafil and Doxycycline    Review of Systems   Review of Systems  Genitourinary:  Positive for flank pain.  All other systems reviewed and are negative.   Physical Exam Updated Vital Signs BP (!) 153/91 (BP Location: Left Arm)   Pulse 87   Temp 98.8 F (37.1 C) (Oral)   Resp 17    SpO2 100%  Physical Exam Vitals and nursing note reviewed.   57 year old male, resting comfortably and in no acute distress. Vital signs are significant for elevated blood pressure. Oxygen saturation is 100%, which is normal. Head is normocephalic and atraumatic. PERRLA, EOMI.  Back is nontender and there is no CVA tenderness.  Straight leg raise is negative bilaterally. Lungs are clear without rales, wheezes, or rhonchi. Chest is nontender. Heart has regular rate and rhythm without murmur. Abdomen is soft, flat, nontender. Extremities have no cyanosis or edema. Skin is warm and dry without rash. Neurologic: Mental status is normal, cranial nerves are intact, moves all extremities equally.  ED Results / Procedures / Treatments   Labs (all labs ordered are listed, but only abnormal results are displayed) Labs Reviewed  URINALYSIS, ROUTINE W REFLEX MICROSCOPIC   Radiology CT Renal Stone Study  Result Date: 01/09/2023 CLINICAL DATA:  Intermittent right flank pain. EXAM: CT ABDOMEN AND PELVIS WITHOUT CONTRAST TECHNIQUE: Multidetector CT imaging of the abdomen and pelvis was performed following the standard protocol without IV contrast. RADIATION DOSE REDUCTION: This exam was performed according to the departmental dose-optimization program which includes automated exposure control, adjustment of the mA and/or kV according to patient size and/or use of iterative reconstruction technique. COMPARISON:  None Available. FINDINGS: Lower chest: No acute abnormality.  Hepatobiliary: No focal liver abnormality is seen. No gallstones, gallbladder wall thickening, or biliary dilatation. Pancreas: Unremarkable. No pancreatic ductal dilatation or surrounding inflammatory changes. Spleen: Normal in size without focal abnormality. Adrenals/Urinary Tract: Adrenal glands are unremarkable. Kidneys are normal in size, without obstructing renal calculi, focal lesion, or hydronephrosis. A 3 mm nonobstructing renal  calculus is seen within the lower pole of the left kidney. The urinary bladder is poorly distended and subsequently limited in evaluation. Mild diffuse urinary bladder wall thickening is noted. Stomach/Bowel: Stomach is within normal limits. Appendix appears normal. No evidence of bowel wall thickening, distention, or inflammatory changes. Noninflamed diverticula are seen throughout the large bowel. Vascular/Lymphatic: Aortic atherosclerosis. No enlarged abdominal or pelvic lymph nodes. Reproductive: The prostate gland is mildly enlarged. Other: A 3.3 cm x 3.2 cm fat containing right inguinal hernia is seen. A 4.4 cm x 2.6 cm fat containing left inguinal hernia is also noted. No abdominopelvic ascites. Musculoskeletal: Multilevel degenerative changes are seen throughout the lumbar spine. IMPRESSION: 1. 3 mm nonobstructing left renal calculus. 2. Colonic diverticulosis. 3. Bilateral fat containing inguinal hernias. 4. Aortic atherosclerosis. 5. Mild prostatomegaly. Aortic Atherosclerosis (ICD10-I70.0). Electronically Signed   By: Aram Candela M.D.   On: 01/09/2023 01:45    Procedures Procedures    Medications Ordered in ED Medications  ketorolac (TORADOL) 30 MG/ML injection 30 mg (30 mg Intravenous Given 01/09/23 0140)  cyclobenzaprine (FLEXERIL) tablet 10 mg (10 mg Oral Given 01/09/23 0231)    ED Course/ Medical Decision Making/ A&P                                 Medical Decision Making Amount and/or Complexity of Data Reviewed Labs: ordered. Radiology: ordered.  Risk Prescription drug management.   Left flank pain.  Consider renal colic, pyelonephritis, musculoskeletal pain.  I have low index of suspicion for abdominal aortic aneurysm.  I have reviewed his past records, and I see no prior abdominal imaging.  I have ordered urinalysis and I have ordered a dose of ketorolac for pain.  Have ordered renal stone protocol CT scan.  I have reviewed his laboratory test, my interpretation is  normal urinalysis.  CT scan shows 3 mm nonobstructing left renal calculus which is not in a position to cause his pain, incidental findings of diverticulosis, inguinal hernias, aortic atherosclerosis, prostatomegaly.  I have independently viewed the images, and agree with the radiologist's interpretation.  He states he is not feeling better following ketorolac.  I ordered a dose of cyclobenzaprine.  Following this, he has noted moderate improvement.  I am discharging him with prescriptions for naproxen, tizanidine as well as a small number of oxycodone tablets.  I have advised him to apply ice, use over-the-counter acetaminophen as needed for additional pain relief.  I have referred him back to his primary care provider for follow-up.  Final Clinical Impression(s) / ED Diagnoses Final diagnoses:  Acute left-sided low back pain without sciatica    Rx / DC Orders ED Discharge Orders          Ordered    naproxen (NAPROSYN) 500 MG tablet  2 times daily        01/09/23 0354    tiZANidine (ZANAFLEX) 4 MG tablet  Every 6 hours PRN        01/09/23 0354    oxyCODONE (ROXICODONE) 5 MG immediate release tablet  Every 4 hours PRN  01/09/23 0354              Dione Booze, MD 01/09/23 [phone]

## 2023-01-09 NOTE — ED Triage Notes (Signed)
Pt c/o intermittent right sided flank pain that started at 10 pm while sleeping. Has hx of kidney stones. Denies any recent trauma/injury. No urinary s/s.

## 2023-01-09 NOTE — Discharge Instructions (Signed)
Apply ice for 30 minutes at a time, 4 times a day.  Take naproxen twice a day, take tizanidine (the muscle relaxer) as often as 4 times a day as needed for muscle spasms.  If you need additional pain relief, you may add acetaminophen.  If you need additional relief beyond that, you may take the oxycodone.  Please follow-up with your primary care provider.  You may benefit from physical therapy, which your primary care provider can set up.

## 2023-01-29 ENCOUNTER — Ambulatory Visit: Payer: Self-pay | Admitting: General Surgery

## 2023-01-29 NOTE — Progress Notes (Signed)
The patient was identified using 2 approved identifiers. All issues noted in this document were discussed and addressed, [name] voiced understanding and agreement with all preoperative instructions. The patient was emailed the surgery instructions per his request.      The patient was instructed to call our Pharmacy [phone]) and the Admitting Office [phone] or [phone]) to complete their Pre-surgical Interview.     COVID Vaccine received:  []  No [x]  Yes Date of any COVID positive Test in last 90 days:  None  PCP - Sanda Linger, MD [phone] (Work) [phone] (Fax)   Cardiologist - none  Chest x-ray - 06-20-2021  2v Epic EKG -11-01-2022  Epic   Stress Test -  ECHO -  Cardiac Cath -   PCR screen: []  Ordered & Completed []   No Order but Needs PROFEND     [x]   N/A for this surgery  Surgery Plan:  [x]  Ambulatory   []  Outpatient in bed  []  Admit Anesthesia:    [x]  General  []  Spinal  []   Choice []   MAC  Bowel Prep - [x]  No  []   Yes ______  Pacemaker / ICD device [x]  No []  Yes   Spinal Cord Stimulator:[x]  No []  Yes       History of Sleep Apnea? []  No [x]  Yes  no sleep study per patient CPAP used?- [x]  No []  Yes   STOP BANG-6  Does the patient monitor blood sugar?   [x]  N/A   []  No []  Yes  Patient has: [x]  NO Hx DM   []  Pre-DM   []  DM1  []   DM2 Had Normal A1c of 5.7 on 03-09-2022   Blood Thinner / Instructions:none Aspirin Instructions:  none  ERAS Protocol Ordered: []  No  [x]  Yes PRE-SURGERY []  ENSURE  []  G2   [x]  No Drink Ordered Patient is to be NPO after:   Dental hx: []  Dentures:  [x]  N/A      []  Bridge or Partial:                   []  Loose or Damaged teeth:   Comments: needs med.recon-  patient to call  Activity level: Patient is able to climb a flight of stairs without difficulty; [x]  No CP  [x]  No SOB. Patient can perform ADLs without assistance.   Anesthesia review: HTN, gout, GERD,  OSA- no test per patient  Patient denies shortness of  breath, fever, cough and chest pain at PAT appointment.  Patient verbalized understanding and agreement to the Pre-Surgical Instructions that were given to them at this PAT appointment. Patient was also educated of the need to review these PAT instructions again prior to his surgery.I reviewed the appropriate phone numbers to call if they have any and questions or concerns.

## 2023-01-29 NOTE — Patient Instructions (Signed)
SURGICAL WAITING ROOM VISITATION Patients having surgery or a procedure may have no more than 2 support people in the waiting area - these visitors may rotate in the visitor waiting room.   Due to an increase in RSV and influenza rates and associated hospitalizations, children ages 61 and under may not visit patients in [facility] hospitals. If the patient needs to stay at the hospital during part of their recovery, the visitor guidelines for inpatient rooms apply.  PRE-OP VISITATION  Pre-op nurse will coordinate an appropriate time for 1 support person to accompany the patient in pre-op.  This support person may not rotate.  This visitor will be contacted when the time is appropriate for the visitor to come back in the pre-op area.  Please refer to the [facility] website for the visitor guidelines for Inpatients (after your surgery is over and you are in a regular room).  You are not required to quarantine at this time prior to your surgery. However, you must do this: Hand Hygiene often Do NOT share personal items Notify your provider if you are in close contact with someone who has COVID or you develop fever 100.4 or greater, new onset of sneezing, cough, sore throat, shortness of breath or body aches.  If you test positive for Covid or have been in contact with anyone that has tested positive in the last 10 days please notify you surgeon.    Your procedure is scheduled on: THURSDAY  February 09, 2023   Report to [facility] Main Entrance: [facility] entrance where the [employer] is available.   Report to admitting at: 05:15    AM  Call this number if you have any questions or problems the morning of surgery [phone]  Do not eat food after Midnight the night prior to your surgery/procedure.  After Midnight you may have the following liquids until     ??    AM / PM DAY OF SURGERY  Clear Liquid Diet Water Black Coffee (sugar ok, NO MILK/CREAM OR CREAMERS)  Tea (sugar  ok, NO MILK/CREAM OR CREAMERS) regular and decaf                             Plain Jell-O  with no fruit (NO RED)                                           Fruit ices (not with fruit pulp, NO RED)                                     Popsicles (NO RED)                                                                  Juice: NO CITRUS JUICES: only apple, WHITE grape, WHITE cranberry Sports drinks like Gatorade or Powerade (NO RED)                   The day of surgery:  Drink ONE (1) Pre-Surgery Clear Ensure or  G2 at    ??    AM the morning of surgery. Drink in one sitting. Do not sip.  This drink was given to you during your hospital pre-op appointment visit. Nothing else to drink after completing the Pre-Surgery Clear Ensure or G2 : No candy, chewing gum or throat lozenges.    FOLLOW ANY ADDITIONAL PRE OP INSTRUCTIONS YOU RECEIVED FROM YOUR SURGEON'S OFFICE!!!   Oral Hygiene is also important to reduce your risk of infection.        Remember - BRUSH YOUR TEETH THE MORNING OF SURGERY WITH YOUR REGULAR TOOTHPASTE  Do NOT smoke after Midnight the night before surgery.  STOP TAKING all Vitamins, Herbs and supplements 1 week before your surgery.   Take ONLY these medicines the morning of surgery with A SIP OF WATER: amlodipine  ??   If You have been diagnosed with Sleep Apnea - Bring CPAP mask and tubing day of surgery. We will provide you with a CPAP machine on the day of your surgery.                   You may not have any metal on your body including  jewelry, and body piercing  Do not wear  lotions, powders, cologne, or deodorant  Men may shave face and neck.  Contacts, Hearing Aids, dentures or bridgework may not be worn into surgery. DENTURES WILL BE REMOVED PRIOR TO SURGERY PLEASE DO NOT APPLY "Poly grip" OR ADHESIVES!!!   Patients discharged on the day of surgery will not be allowed to drive home.  Someone NEEDS to stay with you for the first 24 hours after anesthesia.  Do  not bring your home medications to the hospital. The Pharmacy will dispense medications listed on your medication list to you during your admission in the Hospital.  Please read over the following fact sheets you were given: IF YOU HAVE QUESTIONS ABOUT YOUR PRE-OP INSTRUCTIONS, PLEASE CALL [phone]   [facility] - Preparing for Surgery Before surgery, you can play an important role.  Because skin is not sterile, your skin needs to be as free of germs as possible.  You can reduce the number of germs on your skin by washing with CHG (chlorahexidine gluconate) soap before surgery.  CHG is an antiseptic cleaner which kills germs and bonds with the skin to continue killing germs even after washing. Please DO NOT use if you have an allergy to CHG or antibacterial soaps.  If your skin becomes reddened/irritated stop using the CHG and inform your nurse when you arrive at Short Stay. Do not shave (including legs and underarms) for at least 48 hours prior to the first CHG shower.  You may shave your face/neck.  Please follow these instructions carefully:  1.  Shower with CHG Soap the night before surgery and the  morning of surgery.  2.  If you choose to wash your hair, wash your hair first as usual with your normal  shampoo.  3.  After you shampoo, rinse your hair and body thoroughly to remove the shampoo.                             4.  Use CHG as you would any other liquid soap.  You can apply chg directly to the skin and wash.  Gently with a scrungie or clean washcloth.  5.  Apply the CHG Soap to your body ONLY FROM THE NECK DOWN.  Do not use on face/ open                           Wound or open sores. Avoid contact with eyes, ears mouth and genitals (private parts).                       Wash face,  Genitals (private parts) with your normal soap.             6.  Wash thoroughly, paying special attention to the area where your  surgery  will be performed.  7.  Thoroughly rinse your body with warm  water from the neck down.  8.  DO NOT shower/wash with your normal soap after using and rinsing off the CHG Soap.            9.  Pat yourself dry with a clean towel.            10.  Wear clean pajamas.            11.  Place clean sheets on your bed the night of your first shower and do not  sleep with pets.  ON THE DAY OF SURGERY : Do not apply any lotions/deodorants the morning of surgery.  Please wear clean clothes to the hospital/surgery center.     FAILURE TO FOLLOW THESE INSTRUCTIONS MAY RESULT IN THE CANCELLATION OF YOUR SURGERY  PATIENT SIGNATURE_________________________________  NURSE SIGNATURE__________________________________  ________________________________________________________________________

## 2023-01-30 ENCOUNTER — Encounter (HOSPITAL_COMMUNITY): Payer: Self-pay

## 2023-01-30 ENCOUNTER — Other Ambulatory Visit: Payer: Self-pay | Admitting: Internal Medicine

## 2023-01-30 ENCOUNTER — Encounter (HOSPITAL_COMMUNITY)
Admission: RE | Admit: 2023-01-30 | Discharge: 2023-01-30 | Disposition: A | Discharge status: Home / Self Care | Payer: Medicaid Other | Source: Ambulatory Visit | Attending: Anesthesiology | Admitting: Anesthesiology

## 2023-01-30 ENCOUNTER — Other Ambulatory Visit: Payer: Self-pay

## 2023-01-30 DIAGNOSIS — Z01818 Encounter for other preprocedural examination: Secondary | ICD-10-CM

## 2023-01-30 DIAGNOSIS — I10 Essential (primary) hypertension: Secondary | ICD-10-CM

## 2023-01-30 HISTORY — DX: Personal history of urinary calculi: Z87.442

## 2023-01-30 NOTE — Patient Instructions (Signed)
SURGICAL WAITING ROOM VISITATION Patients having surgery or a procedure may have no more than 2 support people in the waiting area - these visitors may rotate.    Children under the age of 70 must have an adult with them who is not the patient.  If the patient needs to stay at the hospital during part of their recovery, the visitor guidelines for inpatient rooms apply. Pre-op nurse will coordinate an appropriate time for 1 support person to accompany patient in pre-op.  This support person may not rotate.    Please refer to the [facility] website for the visitor guidelines for Inpatients (after your surgery is over and you are in a regular room).       Your procedure is scheduled on: 02-09-23   Report to [facility] Main Entrance    Report to admitting at 5:15 AM   Call this number if you have problems the morning of surgery [phone]   Do not eat food :After Midnight.   After Midnight you may have the following liquids until 4:30 AM DAY OF SURGERY  Water Non-Citrus Juices (without pulp, NO RED-Apple, White grape, White cranberry) Black Coffee (NO MILK/CREAM OR CREAMERS, sugar ok)  Clear Tea (NO MILK/CREAM OR CREAMERS, sugar ok) regular and decaf                             Plain Jell-O (NO RED)                                           Fruit ices (not with fruit pulp, NO RED)                                     Popsicles (NO RED)                                                               Sports drinks like Gatorade (NO RED)                   The day of surgery:  Drink ONE (1) Pre-Surgery Clear Ensure or G2 by 4:30 AM the morning of surgery. Drink in one sitting. Do not sip.  This drink was given to you during your hospital  pre-op appointment visit. Nothing else to drink after completing the Pre-Surgery Clear Ensure or G2.          If you have questions, please contact your surgeon's office.   FOLLOW  ANY ADDITIONAL PRE OP INSTRUCTIONS YOU RECEIVED FROM  YOUR SURGEON'S OFFICE!!!     Oral Hygiene is also important to reduce your risk of infection.                                    Remember - BRUSH YOUR TEETH THE MORNING OF SURGERY WITH YOUR REGULAR TOOTHPASTE   Do NOT smoke after Midnight   Take these medicines the morning of surgery with A SIP OF WATER:   Amlodipine  Okay to use nasal spray  Oxycodone if needed  Stop all vitamins and herbal supplements 7 days before surgery  DO NOT TAKE ANY ORAL DIABETIC MEDICATIONS DAY OF YOUR SURGERY  Bring CPAP mask and tubing day of surgery.                              You may not have any metal on your body including  jewelry, and body piercing             Do not wear lotions, powders, cologne, or deodorant              Men may shave face and neck.   Do not bring valuables to the hospital. [facility] IS NOT RESPONSIBLE   FOR VALUABLES.   Contacts, dentures or bridgework may not be worn into surgery.  DO NOT BRING YOUR HOME MEDICATIONS TO THE HOSPITAL. PHARMACY WILL DISPENSE MEDICATIONS LISTED ON YOUR MEDICATION LIST TO YOU DURING YOUR ADMISSION IN THE HOSPITAL!    Patients discharged on the day of surgery will not be allowed to drive home.  Someone NEEDS to stay with you for the first 24 hours after anesthesia.   Special Instructions: Bring a copy of your healthcare power of attorney and living will documents the day of surgery if you haven't scanned them before.              Please read over the following fact sheets you were given: IF YOU HAVE QUESTIONS ABOUT YOUR PRE-OP INSTRUCTIONS PLEASE CALL [phone] [place]  If you received a COVID test during your pre-op visit  it is requested that you wear a mask when out in public, stay away from anyone that may not be feeling well and notify your surgeon if you develop symptoms. If you test positive for Covid or have been in contact with anyone that has tested positive in the last 10 days please notify you surgeon.  [facility] - Preparing  for Surgery Before surgery, you can play an important role.  Because skin is not sterile, your skin needs to be as free of germs as possible.  You can reduce the number of germs on your skin by washing with CHG (chlorahexidine gluconate) soap before surgery.  CHG is an antiseptic cleaner which kills germs and bonds with the skin to continue killing germs even after washing. Please DO NOT use if you have an allergy to CHG or antibacterial soaps.  If your skin becomes reddened/irritated stop using the CHG and inform your nurse when you arrive at Short Stay. Do not shave (including legs and underarms) for at least 48 hours prior to the first CHG shower.  You may shave your face/neck.  Please follow these instructions carefully:  1.  Shower with CHG Soap the night before surgery and the  morning of surgery.  2.  If you choose to wash your hair, wash your hair first as usual with your normal  shampoo.  3.  After you shampoo, rinse your hair and body thoroughly to remove the shampoo.                             4.  Use CHG as you would any other liquid soap.  You can apply chg directly to the skin and wash.  Gently with a scrungie or clean washcloth.  5.  Apply the CHG Soap to  your body ONLY FROM THE NECK DOWN.   Do   not use on face/ open                           Wound or open sores. Avoid contact with eyes, ears mouth and   genitals (private parts).                       Wash face,  Genitals (private parts) with your normal soap.             6.  Wash thoroughly, paying special attention to the area where your    surgery  will be performed.  7.  Thoroughly rinse your body with warm water from the neck down.  8.  DO NOT shower/wash with your normal soap after using and rinsing off the CHG Soap.                9.  Pat yourself dry with a clean towel.            10.  Wear clean pajamas.            11.  Place clean sheets on your bed the night of your first shower and do not  sleep with pets. Day of  Surgery : Do not apply any lotions/deodorants the morning of surgery.  Please wear clean clothes to the hospital/surgery center.  FAILURE TO FOLLOW THESE INSTRUCTIONS MAY RESULT IN THE CANCELLATION OF YOUR SURGERY  PATIENT SIGNATURE_________________________________  NURSE SIGNATURE__________________________________  ________________________________________________________________________

## 2023-01-30 NOTE — Progress Notes (Signed)
COVID Vaccine Completed:  Date of COVID positive in last 90 days:  PCP - Sanda Linger, MD Cardiologist - N/A  Chest x-ray -  N/A EKG - 11-01-22 Epic Stress Test -  N/A ECHO -  N/A Cardiac Cath -  N/A Pacemaker/ICD device last checked: Spinal Cord Stimulator: N/A  Bowel Prep -  N/A  Sleep Study -  N/A CPAP -   Fasting Blood Sugar -  N/A Checks Blood Sugar _____ times a day  Last dose of GLP1 agonist-  N/A GLP1 instructions:  N/A   Last dose of SGLT-2 inhibitors-  N/A SGLT-2 instructions: N/A   Blood Thinner Instructions:   N/A Aspirin Instructions: Last Dose:  Activity level:  Can go up a flight of stairs and perform activities of daily living without stopping and without symptoms of chest pain or shortness of breath.  Anesthesia review:  BP elevated at lab visit 168/106.  Patient stated that he was out of his BP med but would pick up and restart today.  He was advised that surgery may be canceled if BP uncontrolled day of surgery.  Discussed with Christeen Douglas, PA-C   Patient denies shortness of breath, fever, cough and chest pain at PAT appointment  Patient verbalized understanding of instructions that were given to them at the PAT appointment. Patient was also instructed that they will need to review over the PAT instructions again at home before surgery.

## 2023-02-01 ENCOUNTER — Other Ambulatory Visit: Payer: Self-pay | Admitting: Internal Medicine

## 2023-02-01 ENCOUNTER — Encounter (HOSPITAL_COMMUNITY)
Admission: RE | Admit: 2023-02-01 | Discharge: 2023-02-01 | Disposition: A | Discharge status: Home / Self Care | Payer: Medicaid Other | Source: Ambulatory Visit | Attending: General Surgery | Admitting: General Surgery

## 2023-02-01 DIAGNOSIS — I10 Essential (primary) hypertension: Secondary | ICD-10-CM

## 2023-02-01 DIAGNOSIS — Z01818 Encounter for other preprocedural examination: Secondary | ICD-10-CM

## 2023-02-01 DIAGNOSIS — Z01812 Encounter for preprocedural laboratory examination: Secondary | ICD-10-CM | POA: Diagnosis not present

## 2023-02-01 LAB — BASIC METABOLIC PANEL
Anion gap: 13 (ref 5–15)
BUN: 12 mg/dL (ref 6–20)
CO2: 23 mmol/L (ref 22–32)
Calcium: 9.7 mg/dL (ref 8.9–10.3)
Chloride: 103 mmol/L (ref 98–111)
Creatinine, Ser: 0.78 mg/dL (ref 0.61–1.24)
GFR, Estimated: >60 mL/min (ref >60)
Glucose, Bld: 100 mg/dL — ABNORMAL HIGH (ref 70–99)
Potassium: 3.7 mmol/L (ref 3.5–5.1)
Sodium: 139 mmol/L (ref 135–145)

## 2023-02-01 LAB — CBC
HCT: 42.5 % (ref 39.0–52.0)
Hemoglobin: 14 g/dL (ref 13.0–17.0)
MCH: 30 pg (ref 26.0–34.0)
MCHC: 32.9 g/dL (ref 30.0–36.0)
MCV: 91 fL (ref 80.0–100.0)
Platelets: 240 10*3/uL (ref 150–400)
RBC: 4.67 MIL/uL (ref 4.22–5.81)
RDW: 14.5 % (ref 11.5–15.5)
WBC: 4.1 10*3/uL (ref 4.0–10.5)
nRBC: 0 % (ref 0.0–0.2)

## 2023-02-08 NOTE — H&P (Signed)
CC: here for surgery  Requesting provider: dr Okey Dupre  HPI: [name] is an 57 y.o. male who is here for xi robotic assisted repair of bilateral inguinal hernias with mesh. Last seen in clinic in the spring.   Old hpi: He states a couple months ago he was at work lifting something with a colleague and he felt an immediate pulling sensation in his right groin. It made him drop to the floor. He states that the area hurt for a while and then the pain went away. But then he had recurrent pain in the right groin. He saw the nurse at his job who diagnosed him with bilateral inguinal hernias. He states that the bulge is always sternal in the right side. He also does complains of discomfort in the right groin. He describes it as a stinging and burning sensation. At night if he lays on his right side it will wake him up. He describes it as a nagging sensation. No significant pain or discomfort in the left side. He also has pain in his right lower back that radiates to his right hip area and his right buttock. No dysuria or hematuria. No nausea or vomiting. No prior abdominal surgery.   Past Medical History:  Diagnosis Date   Allergy    Arthritis    Compartment syndrome (HCC)    right arm    GERD (gastroesophageal reflux disease)    History of kidney stones    Hypertension    self reported    Past Surgical History:  Procedure Laterality Date   CARPAL TUNNEL RELEASE Right 2014   COLONOSCOPY     FASCIOTOMY Right    Forearm    Family History  Problem Relation Age of Onset   Cataracts Mother    Dementia Mother    Hypertension Father    Kidney disease Father    Glaucoma Maternal Grandmother    Hypertension Paternal Grandfather    Other Brother        dialysis   Other Brother        dialysis   Colon cancer Neg Hx    Esophageal cancer Neg Hx    Rectal cancer Neg Hx    Stomach cancer Neg Hx     Social:  reports that he quit smoking about 10 years ago. His smoking use included  cigars. He has never used smokeless tobacco. He reports current alcohol use of about 10.0 standard drinks of alcohol per week. He reports that he does not use drugs.  Allergies:  Allergies  Allergen Reactions   Sildenafil Itching   Doxycycline Swelling and Rash    Medications: {medication reviewed/display:[id number]}   ROS - all of the below systems have been reviewed with the patient and positives are indicated with bold text General: chills, fever or night sweats Eyes: blurry vision or double vision ENT: epistaxis or sore throat Allergy/Immunology: itchy/watery eyes or nasal congestion Hematologic/Lymphatic: bleeding problems, blood clots or swollen lymph nodes Endocrine: temperature intolerance or unexpected weight changes Breast: new or changing breast lumps or nipple discharge Resp: cough, shortness of breath, or wheezing CV: chest pain or dyspnea on exertion GI: as per HPI GU: dysuria, trouble voiding, or hematuria MSK: joint pain or joint stiffness Neuro: TIA or stroke symptoms Derm: pruritus and skin lesion changes Psych: anxiety and depression  PE There were no vitals taken for this visit. Constitutional: NAD; conversant; no deformities Eyes: Moist conjunctiva; no lid lag; anicteric; PERRL Neck: Trachea midline; no thyromegaly Lungs: Normal respiratory  effort; no tactile fremitus CV: RRR; no palpable thrills; no pitting edema GI: Abd ***; no palpable hepatosplenomegaly MSK: ***Normal gait; no clubbing/cyanosis Psychiatric: Appropriate affect; alert and oriented x3 Lymphatic: No palpable cervical or axillary lymphadenopathy Skin:***  No results found for this or any previous visit (from the past 48 hour(s)).  No results found.  Imaging: none  A/P: [name] is an 57 y.o. male with  Bilateral inguinal hernias  To OR for xi robotic assisted repair of bilateral inguinal hernias with mesh IV abx Eras protocol All questions asked and answered  Mary Sella.  Andrey Campanile, MD, FACS General, Bariatric, & Minimally Invasive Surgery [facility]

## 2023-02-08 NOTE — Anesthesia Preprocedure Evaluation (Signed)
Anesthesia Evaluation  Patient identified by MRN, date of birth, ID band Patient awake    Reviewed: Allergy & Precautions, H&P , NPO status , Patient's Chart, lab work & pertinent test results  Airway Mallampati: II  TM Distance: >3 FB Neck ROM: Full    Dental no notable dental hx.    Pulmonary Patient abstained from smoking., former smoker   Pulmonary exam normal breath sounds clear to auscultation       Cardiovascular hypertension, Normal cardiovascular exam Rhythm:Regular Rate:Normal     Neuro/Psych  PSYCHIATRIC DISORDERS Anxiety     negative neurological ROS     GI/Hepatic Neg liver ROS,GERD  ,,  Endo/Other  negative endocrine ROS    Renal/GU negative Renal ROS  negative genitourinary   Musculoskeletal  (+) Arthritis ,    Abdominal   Peds negative pediatric ROS (+)  Hematology negative hematology ROS (+)   Anesthesia Other Findings   Reproductive/Obstetrics negative OB ROS                             Anesthesia Physical Anesthesia Plan  ASA: 2  Anesthesia Plan: General   Post-op Pain Management:    Induction: Intravenous  PONV Risk Score and Plan: 2 and Ondansetron and Dexamethasone  Airway Management Planned: Oral ETT  Additional Equipment:   Intra-op Plan:   Post-operative Plan: Extubation in OR  Informed Consent: I have reviewed the patients History and Physical, chart, labs and discussed the procedure including the risks, benefits and alternatives for the proposed anesthesia with the patient or authorized representative who has indicated his/her understanding and acceptance.     Dental advisory given  Plan Discussed with: CRNA  Anesthesia Plan Comments:         Anesthesia Quick Evaluation

## 2023-02-09 ENCOUNTER — Encounter (HOSPITAL_COMMUNITY): Payer: Self-pay | Admitting: General Surgery

## 2023-02-09 ENCOUNTER — Ambulatory Visit (HOSPITAL_COMMUNITY): Payer: Medicaid Other

## 2023-02-09 ENCOUNTER — Encounter (HOSPITAL_COMMUNITY)
Admission: RE | Disposition: A | Discharge status: Home / Self Care | Payer: Self-pay | Source: Home / Self Care | Attending: General Surgery

## 2023-02-09 ENCOUNTER — Ambulatory Visit (HOSPITAL_COMMUNITY)
Admission: RE | Admit: 2023-02-09 | Discharge: 2023-02-09 | Disposition: A | Discharge status: Home / Self Care | Payer: Medicaid Other | Attending: General Surgery | Admitting: General Surgery

## 2023-02-09 ENCOUNTER — Ambulatory Visit (HOSPITAL_BASED_OUTPATIENT_CLINIC_OR_DEPARTMENT_OTHER): Payer: Self-pay

## 2023-02-09 ENCOUNTER — Other Ambulatory Visit: Payer: Self-pay

## 2023-02-09 DIAGNOSIS — D176 Benign lipomatous neoplasm of spermatic cord: Secondary | ICD-10-CM | POA: Diagnosis not present

## 2023-02-09 DIAGNOSIS — Z87891 Personal history of nicotine dependence: Secondary | ICD-10-CM | POA: Diagnosis not present

## 2023-02-09 DIAGNOSIS — F419 Anxiety disorder, unspecified: Secondary | ICD-10-CM | POA: Diagnosis not present

## 2023-02-09 DIAGNOSIS — K4020 Bilateral inguinal hernia, without obstruction or gangrene, not specified as recurrent: Secondary | ICD-10-CM | POA: Insufficient documentation

## 2023-02-09 DIAGNOSIS — I10 Essential (primary) hypertension: Secondary | ICD-10-CM | POA: Diagnosis not present

## 2023-02-09 DIAGNOSIS — K219 Gastro-esophageal reflux disease without esophagitis: Secondary | ICD-10-CM | POA: Insufficient documentation

## 2023-02-09 DIAGNOSIS — M1990 Unspecified osteoarthritis, unspecified site: Secondary | ICD-10-CM | POA: Insufficient documentation

## 2023-02-09 SURGERY — Surgical Case
Anesthesia: *Unknown

## 2023-02-09 SURGERY — REPAIR, HERNIA, INGUINAL, BILATERAL, ROBOT-ASSISTED
Anesthesia: General

## 2023-02-09 MED ORDER — FENTANYL CITRATE PF 50 MCG/ML IJ SOSY
PREFILLED_SYRINGE | INTRAMUSCULAR | Status: AC
  Administered 2023-02-09: 25 ug via INTRAVENOUS
  Filled 2023-02-09: qty 1

## 2023-02-09 MED ORDER — ACETAMINOPHEN 10 MG/ML IV SOLN: 1000.0000 mg | Freq: Once | INTRAVENOUS | Status: DC | PRN

## 2023-02-09 MED ORDER — ACETAMINOPHEN 10 MG/ML IV SOLN
INTRAVENOUS | Status: AC
  Filled 2023-02-09: qty 100

## 2023-02-09 MED ORDER — FENTANYL CITRATE (PF) 100 MCG/2ML IJ SOLN
INTRAMUSCULAR | Status: AC
  Filled 2023-02-09: qty 2

## 2023-02-09 MED ORDER — BUPIVACAINE LIPOSOME 1.3 % IJ SUSP
INTRAMUSCULAR | Status: DC | PRN
  Administered 2023-02-09: 20 mL

## 2023-02-09 MED ORDER — LIDOCAINE 2% (20 MG/ML) 5 ML SYRINGE
INTRAMUSCULAR | Status: DC | PRN
  Administered 2023-02-09: 1.5 mg/kg/h via INTRAVENOUS

## 2023-02-09 MED ORDER — EPHEDRINE SULFATE (PRESSORS) 50 MG/ML IJ SOLN
INTRAMUSCULAR | Status: DC | PRN
  Administered 2023-02-09: 5 mg via INTRAVENOUS

## 2023-02-09 MED ORDER — LIDOCAINE HCL (CARDIAC) PF 100 MG/5ML IV SOSY
PREFILLED_SYRINGE | INTRAVENOUS | Status: DC | PRN
  Administered 2023-02-09: 100 mg via INTRAVENOUS

## 2023-02-09 MED ORDER — DROPERIDOL 2.5 MG/ML IJ SOLN
INTRAMUSCULAR | Status: AC
  Filled 2023-02-09: qty 2

## 2023-02-09 MED ORDER — BUPIVACAINE-EPINEPHRINE (PF) 0.25% -1:200000 IJ SOLN
INTRAMUSCULAR | Status: DC | PRN
  Administered 2023-02-09: 30 mL

## 2023-02-09 MED ORDER — OXYCODONE HCL 5 MG PO TABS: 5.0000 mg | ORAL_TABLET | Freq: Once | ORAL | Status: AC | PRN

## 2023-02-09 MED ORDER — CHLORHEXIDINE GLUCONATE CLOTH 2 % EX PADS: 6.0000 | MEDICATED_PAD | Freq: Once | CUTANEOUS | Status: DC

## 2023-02-09 MED ORDER — OXYCODONE HCL 5 MG/5ML PO SOLN: 5.0000 mg | Freq: Once | ORAL | Status: AC | PRN

## 2023-02-09 MED ORDER — MIDAZOLAM HCL 2 MG/2ML IJ SOLN
INTRAMUSCULAR | Status: AC
  Filled 2023-02-09: qty 2

## 2023-02-09 MED ORDER — BUPIVACAINE LIPOSOME 1.3 % IJ SUSP
INTRAMUSCULAR | Status: AC
  Filled 2023-02-09: qty 20

## 2023-02-09 MED ORDER — SUGAMMADEX SODIUM 200 MG/2ML IV SOLN
INTRAVENOUS | Status: DC | PRN
  Administered 2023-02-09: 300 mg via INTRAVENOUS

## 2023-02-09 MED ORDER — BUPIVACAINE-EPINEPHRINE 0.25% -1:200000 IJ SOLN
INTRAMUSCULAR | Status: AC
  Filled 2023-02-09: qty 1

## 2023-02-09 MED ORDER — PHENYLEPHRINE HCL (PRESSORS) 10 MG/ML IV SOLN
INTRAVENOUS | Status: DC | PRN
  Administered 2023-02-09 (×4): 100 ug via INTRAVENOUS

## 2023-02-09 MED ORDER — DEXAMETHASONE SODIUM PHOSPHATE 10 MG/ML IJ SOLN
INTRAMUSCULAR | Status: DC | PRN
  Administered 2023-02-09: 10 mg via INTRAVENOUS

## 2023-02-09 MED ORDER — ACETAMINOPHEN 500 MG PO TABS: 1000.0000 mg | ORAL_TABLET | Freq: Three times a day (TID) | ORAL | Status: AC

## 2023-02-09 MED ORDER — FENTANYL CITRATE (PF) 100 MCG/2ML IJ SOLN
INTRAMUSCULAR | Status: DC | PRN
  Administered 2023-02-09 (×2): 100 ug via INTRAVENOUS

## 2023-02-09 MED ORDER — DROPERIDOL 2.5 MG/ML IJ SOLN
0.6250 mg | Freq: Once | INTRAMUSCULAR | Status: AC | PRN
  Administered 2023-02-09: 0.625 mg via INTRAVENOUS

## 2023-02-09 MED ORDER — FENTANYL CITRATE PF 50 MCG/ML IJ SOSY
25.0000 ug | PREFILLED_SYRINGE | INTRAMUSCULAR | Status: DC | PRN
  Administered 2023-02-09: 25 ug via INTRAVENOUS

## 2023-02-09 MED ORDER — 0.9 % SODIUM CHLORIDE (POUR BTL) OPTIME
TOPICAL | Status: DC | PRN
  Administered 2023-02-09: 1000 mL

## 2023-02-09 MED ORDER — ACETAMINOPHEN 500 MG PO TABS
1000.0000 mg | ORAL_TABLET | ORAL | Status: AC
  Administered 2023-02-09: 1000 mg via ORAL
  Filled 2023-02-09: qty 2

## 2023-02-09 MED ORDER — PROPOFOL 10 MG/ML IV BOLUS
INTRAVENOUS | Status: DC | PRN
  Administered 2023-02-09: 150 mg via INTRAVENOUS

## 2023-02-09 MED ORDER — LACTATED RINGERS IV SOLN: INTRAVENOUS | Status: DC | PRN

## 2023-02-09 MED ORDER — PROPOFOL 10 MG/ML IV BOLUS
INTRAVENOUS | Status: AC
  Filled 2023-02-09: qty 20

## 2023-02-09 MED ORDER — ROCURONIUM BROMIDE 100 MG/10ML IV SOLN
INTRAVENOUS | Status: DC | PRN
  Administered 2023-02-09 (×3): 20 mg via INTRAVENOUS
  Administered 2023-02-09: 70 mg via INTRAVENOUS
  Administered 2023-02-09: 10 mg via INTRAVENOUS

## 2023-02-09 MED ORDER — OXYCODONE HCL 5 MG PO TABS
ORAL_TABLET | ORAL | Status: AC
  Administered 2023-02-09: 5 mg via ORAL
  Filled 2023-02-09: qty 1

## 2023-02-09 MED ORDER — PHENYLEPHRINE HCL-NACL 20-0.9 MG/250ML-% IV SOLN
INTRAVENOUS | Status: AC
  Filled 2023-02-09: qty 500

## 2023-02-09 MED ORDER — ONDANSETRON HCL 4 MG/2ML IJ SOLN
INTRAMUSCULAR | Status: DC | PRN
  Administered 2023-02-09: 4 mg via INTRAVENOUS

## 2023-02-09 MED ORDER — CEFAZOLIN SODIUM-DEXTROSE 2-4 GM/100ML-% IV SOLN
2.0000 g | INTRAVENOUS | Status: AC
Start: 2023-02-09 — End: 2023-02-09
  Administered 2023-02-09: 2 g via INTRAVENOUS
  Filled 2023-02-09: qty 100

## 2023-02-09 MED ORDER — ORAL CARE MOUTH RINSE: 15.0000 mL | Freq: Once | OROMUCOSAL | Status: AC

## 2023-02-09 MED ORDER — OXYCODONE HCL 5 MG PO TABS: 5.0000 mg | ORAL_TABLET | Freq: Four times a day (QID) | ORAL | 0 refills | Status: DC | PRN

## 2023-02-09 MED ORDER — CHLORHEXIDINE GLUCONATE 0.12 % MT SOLN
15.0000 mL | Freq: Once | OROMUCOSAL | Status: AC
  Administered 2023-02-09: 15 mL via OROMUCOSAL

## 2023-02-09 MED ORDER — SODIUM CHLORIDE 0.9 % IV SOLN: INTRAVENOUS | Status: DC | PRN

## 2023-02-09 SURGICAL SUPPLY — 57 items
ANTIFOG SOL W/FOAM PAD STRL (MISCELLANEOUS) ×1
BAG COUNTER SPONGE SURGICOUNT (BAG) IMPLANT
BLADE SURG SZ11 CARB STEEL (BLADE) ×1 IMPLANT
CANNULA REDUCER 12-8 DVNC XI (CANNULA) IMPLANT
CHLORAPREP W/TINT 26 (MISCELLANEOUS) ×1 IMPLANT
COVER MAYO STAND STRL (DRAPES) ×1 IMPLANT
COVER SURGICAL LIGHT HANDLE (MISCELLANEOUS) ×1 IMPLANT
COVER TIP SHEARS 8 DVNC (MISCELLANEOUS) ×1 IMPLANT
DRAPE ARM DVNC X/XI (DISPOSABLE) ×3 IMPLANT
DRAPE COLUMN DVNC XI (DISPOSABLE) ×1 IMPLANT
DRIVER NDL LRG 8 DVNC XI (INSTRUMENTS) ×1 IMPLANT
DRIVER NDL MEGA SUTCUT DVNCXI (INSTRUMENTS) ×2 IMPLANT
DRIVER NDLE LRG 8 DVNC XI (INSTRUMENTS) ×1
DRIVER NDLE MEGA SUTCUT DVNCXI (INSTRUMENTS) ×2
DRSG TEGADERM 2-3/8X2-3/4 SM (GAUZE/BANDAGES/DRESSINGS) ×3 IMPLANT
ELECT REM PT RETURN 15FT ADLT (MISCELLANEOUS) ×1 IMPLANT
GAUZE SPONGE 2X2 8PLY STRL LF (GAUZE/BANDAGES/DRESSINGS) ×1 IMPLANT
GLOVE BIO SURGEON STRL SZ7.5 (GLOVE) ×2 IMPLANT
GLOVE INDICATOR 8.0 STRL GRN (GLOVE) ×2 IMPLANT
GOWN STRL REUS W/ TWL XL LVL3 (GOWN DISPOSABLE) ×2 IMPLANT
GOWN STRL REUS W/TWL XL LVL3 (GOWN DISPOSABLE) ×2
GRASPER SUT TROCAR 14GX15 (MISCELLANEOUS) IMPLANT
GRASPER TIP-UP FEN DVNC XI (INSTRUMENTS) ×1 IMPLANT
IRRIG SUCT STRYKERFLOW 2 WTIP (MISCELLANEOUS)
IRRIGATION SUCT STRKRFLW 2 WTP (MISCELLANEOUS) IMPLANT
KIT BASIN OR (CUSTOM PROCEDURE TRAY) ×1 IMPLANT
KIT TURNOVER KIT A (KITS) IMPLANT
MARKER SKIN DUAL TIP RULER LAB (MISCELLANEOUS) ×1 IMPLANT
MESH 3DMAX MID 4X6 LT LRG (Mesh General) IMPLANT
MESH 3DMAX MID 4X6 RT LRG (Mesh General) IMPLANT
NDL HYPO 22X1.5 SAFETY MO (MISCELLANEOUS) ×1 IMPLANT
NEEDLE HYPO 22X1.5 SAFETY MO (MISCELLANEOUS) ×1
OBTURATOR OPTICAL STND 8 DVNC (TROCAR) ×1
OBTURATOR OPTICALSTD 8 DVNC (TROCAR) ×1 IMPLANT
PACK CARDIOVASCULAR III (CUSTOM PROCEDURE TRAY) ×1 IMPLANT
PAD POSITIONING PINK XL (MISCELLANEOUS) ×1 IMPLANT
SCISSORS LAP 5X35 DISP (ENDOMECHANICALS) IMPLANT
SCISSORS MNPLR CVD DVNC XI (INSTRUMENTS) ×1 IMPLANT
SEAL UNIV 5-12 XI (MISCELLANEOUS) ×3 IMPLANT
SOL ELECTROSURG ANTI STICK (MISCELLANEOUS) ×1
SOLUTION ANTFG W/FOAM PAD STRL (MISCELLANEOUS) ×1 IMPLANT
SOLUTION ELECTROSURG ANTI STCK (MISCELLANEOUS) ×1 IMPLANT
SPIKE FLUID TRANSFER (MISCELLANEOUS) ×1 IMPLANT
STRIP CLOSURE SKIN 1/2X4 (GAUZE/BANDAGES/DRESSINGS) ×1 IMPLANT
SUT MNCRL AB 4-0 PS2 18 (SUTURE) ×1 IMPLANT
SUT VIC AB 2-0 SH 27 (SUTURE)
SUT VIC AB 2-0 SH 27X BRD (SUTURE) IMPLANT
SUT VIC AB 3-0 SH 27 (SUTURE) ×3
SUT VIC AB 3-0 SH 27XBRD (SUTURE) ×2 IMPLANT
SUT VICRYL 0 UR6 27IN ABS (SUTURE) IMPLANT
SUT VLOC 180 3-0 9IN GS21 (SUTURE) ×1 IMPLANT
SYR 20ML LL LF (SYRINGE) ×1 IMPLANT
TAPE STRIPS DRAPE STRL (GAUZE/BANDAGES/DRESSINGS) ×1 IMPLANT
TOWEL OR 17X26 10 PK STRL BLUE (TOWEL DISPOSABLE) ×1 IMPLANT
TOWEL OR NON WOVEN STRL DISP B (DISPOSABLE) ×1 IMPLANT
TROCAR XCEL NON-BLD 5MMX100MML (ENDOMECHANICALS) ×1 IMPLANT
TUBING INSUFFLATION 10FT LAP (TUBING) ×1 IMPLANT

## 2023-02-09 NOTE — Brief Op Note (Signed)
02/09/2023  10:58 AM  PATIENT:  [name]  57 y.o. male  PRE-OPERATIVE DIAGNOSIS:  BILATERAL INGUINAL HERNIA REPAIR  POST-OPERATIVE DIAGNOSIS:  BILATERAL INGUINAL HERNIA REPAIR  PROCEDURE:  Procedure(s): XI ROBOTIC ASSISTED BILATERAL INGUINAL HERNIA WITH MESH, BILATERAL TAP BLOCK (N/A)  SURGEON:  Surgeons and Role:    Gaynelle Adu, MD - Primary  PHYSICIAN ASSISTANT:   ASSISTANTS: none   ANESTHESIA:   general  EBL:  10 mL   BLOOD ADMINISTERED:none  DRAINS: none   LOCAL MEDICATIONS USED:  MARCAINE    and OTHER exparel   SPECIMEN:  Source of Specimen:  none  DISPOSITION OF SPECIMEN:  N/A  COUNTS:  YES  TOURNIQUET:  * No tourniquets in log *  DICTATION: .Dragon Dictation  PLAN OF CARE: Discharge to home after PACU  PATIENT DISPOSITION:  PACU - hemodynamically stable.   Delay start of Pharmacological VTE agent (>24hrs) due to surgical blood loss or risk of bleeding: no

## 2023-02-09 NOTE — Transfer of Care (Signed)
Immediate Anesthesia Transfer of Care Note  Patient: [name]  Procedure(s) Performed: XI ROBOTIC ASSISTED BILATERAL INGUINAL HERNIA WITH MESH, BILATERAL TAP BLOCK  Patient Location: PACU  Anesthesia Type:General  Level of Consciousness: awake and alert   Airway & Oxygen Therapy: Patient Spontanous Breathing and Patient connected to nasal cannula oxygen  Post-op Assessment: Report given to RN and Post -op Vital signs reviewed and stable  Post vital signs: Reviewed and stable  Last Vitals:  Vitals Value Taken Time  BP 136/85 02/09/23 1045  Temp    Pulse 91 02/09/23 1046  Resp 25 02/09/23 1047  SpO2 100 % 02/09/23 1046  Vitals shown include unfiled device data.  Last Pain:  Vitals:   02/09/23 0556  TempSrc: Oral  PainSc: 0-No pain         Complications:  Encounter Notable Events  Notable Event Outcome Phase Comment  Difficult to intubate - unexpected  Intraprocedure Filed from anesthesia note documentation.

## 2023-02-09 NOTE — Anesthesia Postprocedure Evaluation (Signed)
Anesthesia Post Note  Patient: [name]  Procedure(s) Performed: XI ROBOTIC ASSISTED BILATERAL INGUINAL HERNIA WITH MESH, BILATERAL TAP BLOCK     Patient location during evaluation: PACU Anesthesia Type: General Level of consciousness: awake and alert Pain management: pain level controlled Vital Signs Assessment: post-procedure vital signs reviewed and stable Respiratory status: spontaneous breathing, nonlabored ventilation, respiratory function stable and patient connected to nasal cannula oxygen Cardiovascular status: blood pressure returned to baseline and stable Postop Assessment: no apparent nausea or vomiting Anesthetic complications: yes   Encounter Notable Events  Notable Event Outcome Phase Comment  Difficult to intubate - unexpected  Intraprocedure Filed from anesthesia note documentation.    Last Vitals:  Vitals:   02/09/23 1130 02/09/23 1144  BP: 135/82 (!) 144/87  Pulse: 84 86  Resp: 20 17  Temp: 36.9 C 36.8 C  SpO2: 96% 100%    Last Pain:  Vitals:   02/09/23 1144  TempSrc:   PainSc: 2                  Brooks Nation

## 2023-02-09 NOTE — Anesthesia Procedure Notes (Signed)
Procedure Name: Intubation Date/Time: 02/09/2023 7:32 AM  Performed by: Carloyn Manner, CRNAPre-anesthesia Checklist: Suction available, Patient being monitored, Timeout performed, Emergency Drugs available and Patient identified Patient Re-evaluated:Patient Re-evaluated prior to induction Oxygen Delivery Method: Circle system utilized Preoxygenation: Pre-oxygenation with 100% oxygen Induction Type: IV induction Ventilation: Mask ventilation without difficulty Laryngoscope Size: Miller and 2 Grade View: Grade IV Tube type: Oral Tube size: 7.5 mm Number of attempts: 2 Airway Equipment and Method: Stylet Placement Confirmation: CO2 detector and positive ETCO2 Secured at: 23 cm Tube secured with: Tape Dental Injury: Injury to lip  Difficulty Due To: Difficulty was unanticipated, Difficult Airway-  due to edematous airway and Difficult Airway- due to reduced neck mobility Comments: Recommend Video Scope

## 2023-02-09 NOTE — Interval H&P Note (Signed)
History and Physical Interval Note:  02/09/2023 7:15 AM  [name]  has presented today for surgery, with the diagnosis of BILATERAL INGUINAL HERNIA REPAIR.  The various methods of treatment have been discussed with the patient and family. After consideration of risks, benefits and other options for treatment, the patient has consented to  Procedure(s): XI ROBOTIC ASSISTED BILATERAL INGUINAL HERNIA WITH MESH (N/A) as a surgical intervention.  The patient's history has been reviewed, patient examined, no change in status, stable for surgery.  I have reviewed the patient's chart and labs.  Questions were answered to the patient's satisfaction.     Gaynelle Adu

## 2023-02-09 NOTE — Op Note (Signed)
PREOPERATIVE DIAGNOSIS: bilateral inguinal hernia.    POSTOPERATIVE DIAGNOSIS: bilateral  indirect  inguinal hernias   PROCEDURE: Robotic/XI repair of bilateral indirect inguinal hernias with  mesh (rTAPP).  Laparoscopic bilateral TAP block   SURGEON: Mary Sella. Andrey Campanile, MD    ASSISTANT SURGEON: None.    ANESTHESIA: General plus local consisting of 0.25% Marcaine with epi mixed with exparel.    ESTIMATED BLOOD LOSS: Minimal.    FINDINGS: The patient had bilateral indirect inguinal hernias - each contained large lipomas that were extending down from retroperitoneum.  Each were repaired using Bard 3d large midweight  mesh    SPECIMEN: none   INDICATIONS FOR PROCEDURE: 57yo presented for repair of s symptomatic inguinal hernias. The risks and benefits including but not limited to bleeding, infection, chronic inguinal pain, nerve entrapment, hernia recurrence, mesh complications, hematoma formation, urinary retention, injury to the testicles or the ovaries, numbness in the groin, blood clots, injury to the surrounding structures, and anesthesia risk was discussed with the patient.  Preoperative showed bilateral fat-containing inguinal hernias   DESCRIPTION OF PROCEDURE: After obtaining verbal consent the patient was then taken back to the operating room, placed  supine on the operating room table. General endotracheal anesthesia was  established. Foley was placed. Sequential compression devices were placed. The  abdomen and groin were prepped and draped in the usual standard surgical  fashion with ChloraPrep. The patient received oral Tylenol preoperatively as well as IV  antibiotics prior to the incision. A surgical time-out was performed.  Local was infiltrated at the base of the umbilicus.     Optical entry was made using the Optiview technique in the left midclavicular line about 8 cm lateral to the umbilicus a few centimeters below the left subcostal margin.  Using a 0 degree 5 mm  laparoscope through a 5 mm trocar I was able to advance the laparoscope carefully through all layers of the abdominal wall and carefully entered the abdominal cavity.  Pneumoperitoneum was smoothly established up to a patient pressure of 15 mmHg without any change in patient vital signs.  There is no evidence of injury to surrounding structures.  A bilateral laparoscopic tap block was performed for postoperative pain relief  The inguinal areas were inspected and there was evidence of bilateral indirect inguinal hernias.  Patient was placed in Trendelenburg position.  A robotic 8 mm trocar was placed in the supraumbilical position about 18 cm from the pubic bone.  An additional 8 mm robotic trocar was placed in the right lateral abdominal wall.  The optical entry trocar was exchanged for an 8 mm robotic trocar.   A large piece of left and right (they were marked) Bard 3D midweight  mesh were placed through the robotic trocar into the abdominal cavity.  I went ahead and placed two  3-0 Vicryl sutures  on SH into the abdomen off to the side.  I then placed two 3-0 absorbable V-Loc sutures through the  trocar into the abdomen off to the side.    We then deployed the robot for pelvic surgery. The robot was docked.  Robotic laparoscope was placed through the supraumbilical trocar and the anatomy was targeted.  The other arms were then connected to the trochars.  A pair of MCS scissors was placed through the right trocar and a fenestrated bipolar through the left trocar all under direct visualization.  I then scrubbed out and went to the robotic console.    I then made incision along the peritoneum  on the right, starting 2 inches above the anterior superior iliac spine and caring it medial toward the median umbilical ligament in a lazy S configuration using MCS scissors with electrocautery. The peritoneal flap was then gently dissected downward from the anterior abdominal wall taking care not to  injure the  inferior epigastric vessels. The pubic bone was identified.  Dissection continued about 2 cm below the level of the pubic bone.  There is no evidence of obturator or femoral canal hernias.  The testicular vessels were identified.  Using traction and counter traction, I reduced the sac in  its entirety.  The patient had a very large cord lipoma.  It was reduced.  It was connected to his retroperitoneum. the testicular vessels had been identified and preserved. The vas deferens was identified and preserved, and the hernia sac was stripped from those to  surrounding structures.    I then went about creating a large pocket by lifting the peritoneum of the pelvic floor. I took great care not to injure the iliac vessels.     In a similar fashion on the left side of the peritoneum, I made an incision into the peritoneum on the left extending it medial leaving a small intact piece of peritoneal flap between the left and right side.  The peritoneal flap was then gently dissected downward from the anterior abdominal wall there was some bleeding from one of the inferior epigastric vessels on the left.  It was just a little bit of oozing.  I identified the pubic bone from the left side.  We then continued the dissection below the pubic bone on the left side in a similar fashion.  No evidence of direct or obturator hernias.  Again on this side the patient had a very large cord lipoma which was reduced from the indirect space.  It was also attached to his retroperitoneum and not easily separated from the retroperitoneum so I left it intact like I did on the right side.  I then lifted the peritoneal flap off the testicular vessels and surrounding structures.  Testicular vessels and vas deferens were identified and preserved.  I then went about creating another large pocket on the left side lifting the peritoneum off the pelvic floor.  Both these cord lipomas were very large and bulky and connected to the retroperitoneum.  I  did not feel it was safe to excise them because of potential risk of bleeding.  I decided to leave them in place.   I then obtained the previously placed piece of Bard large 3D right mid weight mesh for the right groin and placed it into the inguinal area.   half of it covered medial  to the inferior epigastric vessels and half of it lateral to the  inferior epigastric vessels. The defect was well  covered with the mesh.  I then secured the mesh to Cooper's ligament with an interrupted 3-0 Vicryl suture.  I placed an additional suture superior medially along the edge of the mesh medial to the inferior gastric vessel.  I placed a 3 Vicryl suture laterally along the superior lateral edge of the mesh lateral to the inferior epigastric vessels.   I mirrored the same placement of the left groin mesh - three sutures of 3-0 Vicryl suture in a similar location for the large left 3D mid weight mesh   I then closed the peritoneal flap with a running 3-0  V-Loc first on the right -I did anchor the cord lipoma  with the suture to the abdominal wall in order to prevent it from telescoping underneath the mesh.  The mesh was well covered.  There was a small defect in the peritoneal flap which was closed with a figure-of-eight 3-0 Vicryl suture.  The mesh was flat.  It had not curled up.  In a similar fashion I closed the peritoneal flap on the left with a running 3 oh V-Loc suture again incorporating and anchoring the cord lipoma to the anterior abdominal wall.  The area of the inferior epigastric vessel that had been oozing was still oozing so I oversewed that with a 3 OV lock as well as I was closing the peritoneal flap.  Pneumoperitoneum was reduced and the area was reinspected.  There is no evidence of ongoing bleeding.   The surgical robot was undocked and moved away from the OR table.  I scrubbed back in.    we then placed a laparoscopic needle driver and the 4 sutures that had been placed in the abdomen at the  begin the case were removed  There was no evidence of injury to surrounding structures. Pneumoperitoneum was released, and the remaining trocars were removed. All skin incisions  were closed with a 4-0 Monocryl in a subcuticular fashion followed by application of Dermabond all needle, instrument, and sponge counts  were correct x2.   There are no immediate complications.  Foley catheter was removed. The patient tolerated the procedure well. The patient was extubated and taken to the  recovery room in stable condition.  Mary Sella. Andrey Campanile, MD, FACS General, Bariatric, & Minimally Invasive Surgery [facility],  [facility]

## 2023-02-09 NOTE — Discharge Instructions (Signed)
LAPAROSCOPIC/Robotic SURGERY: POST OP INSTRUCTIONS Always review your discharge instruction sheet given to you by the facility where your surgery was performed. IF YOU HAVE DISABILITY OR FAMILY LEAVE FORMS, YOU MUST BRING THEM TO THE OFFICE FOR PROCESSING.   DO NOT GIVE THEM TO YOUR DOCTOR.  PAIN CONTROL  First take acetaminophen (Tylenol) AND/or ibuprofen (Advil) to control your pain after surgery.  Follow directions on package.  Taking acetaminophen (Tylenol) and/or ibuprofen (Advil) regularly after surgery will help to control your pain and lower the amount of prescription pain medication you may need.  You should not take more than 3,000 mg (3 grams) of acetaminophen (Tylenol) in 24 hours.  You should not take ibuprofen (Advil), aleve, motrin, naprosyn or other NSAIDS if you have a history of stomach ulcers or chronic kidney disease.  A prescription for pain medication may be given to you upon discharge.  Take your pain medication as prescribed, if you still have uncontrolled pain after taking acetaminophen (Tylenol) or ibuprofen (Advil). Use ice packs to help control pain. If you need a refill on your pain medication, please contact your pharmacy.  They will contact our office to request authorization. Prescriptions will not be filled after 5pm or on week-ends.  HOME MEDICATIONS Take your usually prescribed medications unless otherwise directed.  DIET You should follow a light diet the first few days after arrival home.  Be sure to include lots of fluids daily. Avoid fatty, fried foods.   CONSTIPATION It is common to experience some constipation after surgery and if you are taking pain medication.  Increasing fluid intake and taking a stool softener (such as Colace) will usually help or prevent this problem from occurring.  A mild laxative (Milk of Magnesia or Miralax) should be taken according to package instructions if there are no bowel movements after 48 hours.  WOUND/INCISION  CARE Most patients will experience some swelling and bruising in the area of the incisions.  Ice packs will help.  Swelling and bruising can take several days to resolve.  Unless discharge instructions indicate otherwise, follow guidelines below  STERI-STRIPS - you may remove your outer bandages 48 hours after surgery, and you may shower at that time.  You have steri-strips (small skin tapes) in place directly over the incision.  These strips should be left on the skin for 7-10 days.   DERMABOND/SKIN GLUE - you may shower in 24 hours.  The glue will flake off over the next 2-3 weeks. Any sutures or staples will be removed at the office during your follow-up visit.  ACTIVITIES You may resume regular (light) daily activities beginning the next day--such as daily self-care, walking, climbing stairs--gradually increasing activities as tolerated.  You may have sexual intercourse when it is comfortable.  Refrain from any heavy lifting or straining until approved by your doctor. You may drive when you are no longer taking prescription pain medication, you can comfortably wear a seatbelt, and you can safely maneuver your car and apply brakes.  FOLLOW-UP You should see your doctor in the office for a follow-up appointment approximately 2-3 weeks after your surgery.  You should have been given your post-op/follow-up appointment when your surgery was scheduled.  If you did not receive a post-op/follow-up appointment, make sure that you call for this appointment within a day or two after you arrive home to insure a convenient appointment time.  OTHER INSTRUCTIONS   WHEN TO CALL YOUR DOCTOR: Fever over 101.0 Inability to urinate Continued bleeding from incision. Increased pain,  redness, or drainage from the incision. Increasing abdominal pain  The clinic staff is available to answer your questions during regular business hours.  Please don't hesitate to call and ask to speak to one of the nurses for  clinical concerns.  If you have a medical emergency, go to the nearest emergency room or call 911.  A surgeon from [facility] is always on call at the hospital. [address], [address], [place], [place]  [address] ? [address] [address], [place], [place]   [address] [phone] ? [phone] ? FAX [phone] Web site: [email or web address]

## 2023-08-01 ENCOUNTER — Ambulatory Visit: Payer: Self-pay

## 2023-08-01 NOTE — Telephone Encounter (Signed)
  Chief Complaint: numbness/tingling in feet Symptoms: bilateral feet numbness and tingling Frequency: x 1 month, worse at night and in the morning Pertinent Negatives: Patient denies hx of diabetes, chest pain, difficulty breathing, headaches, dizziness, changes in speech or vision, urinary frequency, pain in feet Disposition: [] ED /[] Urgent Care (no appt availability in office) / [x] Appointment(In office/virtual)/ []  [facility]/ [] Home Care/ [] Refused Recommended Disposition /[] [facility]/ []  Follow-up with PCP Additional Notes: Patient agreeable to acute visit with available provider at PCP does not have openings til June.   Copied from CRM [id number]. Topic: Clinical - Red Word Triage >> Aug 01, 2023  1:29 PM Abigail D wrote: Red Word that prompted transfer to Nurse Triage: Numbness in both feet, been going on for 1-2 weeks. More severe in the mornings. Reason for Disposition  [1] Numbness or tingling in one or both feet AND [2] is a chronic symptom (recurrent or ongoing AND present > 4 weeks)  Answer Assessment - Initial Assessment Questions 1. SYMPTOM: "What is the main symptom you are concerned about?" (e.g., weakness, numbness)     Numbness and tingling in both feet.  2. ONSET: "When did this start?" (minutes, hours, days; while sleeping)     X 1 month.  3. LAST NORMAL: "When was the last time you (the patient) were normal (no symptoms)?"     About a month ago.  4. PATTERN "Does this come and go, or has it been constant since it started?"  "Is it present now?"     Intermittent (goes away if he gets up and walks around) and worse in the morning. Present now.  5. CARDIAC SYMPTOMS: "Have you had any of the following symptoms: chest pain, difficulty breathing, palpitations?"     Denies.  6. NEUROLOGIC SYMPTOMS: "Have you had any of the following symptoms: headache, dizziness, vision loss, double vision, changes in speech, unsteady on your feet?"      Denies.  7. OTHER SYMPTOMS: "Do you have any other symptoms?"     Denies.  8. PREGNANCY: "Is there any chance you are pregnant?" "When was your last menstrual period?"     N/A.  Protocols used: Neurologic Deficit-A-AH

## 2023-08-01 NOTE — Progress Notes (Unsigned)
    Subjective:    Patient ID: [name], male    DOB: 1965/08/08, 58 y.o.   MRN: [id number]      HPI [facility] is here for No chief complaint on file.    Bilateral feet numbness/tingling x 1 month.  This is worse at night and first thing in the morning.     Medications and allergies reviewed with patient and updated if appropriate.  Current Outpatient Medications on File Prior to Visit  Medication Sig Dispense Refill   amlodipine -olmesartan  (AZOR ) 10-20 MG tablet Take 1 tablet by mouth daily.     cyclobenzaprine  (FLEXERIL ) 10 MG tablet Take 10 mg by mouth 3 (three) times daily as needed for muscle spasms.     oxyCODONE  (OXY IR/ROXICODONE ) 5 MG immediate release tablet Take 1 tablet (5 mg total) by mouth every 6 (six) hours as needed for severe pain (pain score 7-10). 15 tablet 0   rosuvastatin  (CRESTOR ) 10 MG tablet Take 10 mg by mouth daily.     No current facility-administered medications on file prior to visit.    Review of Systems     Objective:  There were no vitals filed for this visit. BP Readings from Last 3 Encounters:  02/09/23 (!) 144/87  02/01/23 (!) 168/106  01/09/23 115/78   Wt Readings from Last 3 Encounters:  02/09/23 187 lb (84.8 kg)  02/01/23 187 lb 4 oz (84.9 kg)  11/01/22 185 lb (83.9 kg)   There is no height or weight on file to calculate BMI.    Physical Exam         Assessment & Plan:    See Problem List for Assessment and Plan of chronic medical problems.

## 2023-08-02 ENCOUNTER — Encounter: Payer: Self-pay | Admitting: Internal Medicine

## 2023-08-02 ENCOUNTER — Ambulatory Visit: Admitting: Internal Medicine

## 2023-08-02 VITALS — BP 140/78 | HR 96 | Temp 98.5°F | Ht 68.0 in | Wt 184.0 lb

## 2023-08-02 DIAGNOSIS — E538 Deficiency of other specified B group vitamins: Secondary | ICD-10-CM

## 2023-08-02 DIAGNOSIS — G629 Polyneuropathy, unspecified: Secondary | ICD-10-CM

## 2023-08-02 DIAGNOSIS — R7303 Prediabetes: Secondary | ICD-10-CM | POA: Diagnosis not present

## 2023-08-02 DIAGNOSIS — E785 Hyperlipidemia, unspecified: Secondary | ICD-10-CM

## 2023-08-02 DIAGNOSIS — E876 Hypokalemia: Secondary | ICD-10-CM

## 2023-08-02 DIAGNOSIS — I10 Essential (primary) hypertension: Secondary | ICD-10-CM | POA: Diagnosis not present

## 2023-08-02 LAB — COMPREHENSIVE METABOLIC PANEL WITH GFR
ALT: 33 U/L (ref 0–53)
AST: 81 U/L — ABNORMAL HIGH (ref 0–37)
Albumin: 4.6 g/dL (ref 3.5–5.2)
Alkaline Phosphatase: 57 U/L (ref 39–117)
BUN: 8 mg/dL (ref 6–23)
CO2: 25 meq/L (ref 19–32)
Calcium: 9.6 mg/dL (ref 8.4–10.5)
Chloride: 99 meq/L (ref 96–112)
Creatinine, Ser: 0.71 mg/dL (ref 0.40–1.50)
GFR: 100.1 mL/min (ref >60.00)
Glucose, Bld: 98 mg/dL (ref 70–99)
Potassium: 3 meq/L — ABNORMAL LOW (ref 3.5–5.1)
Sodium: 138 meq/L (ref 135–145)
Total Bilirubin: 0.7 mg/dL (ref 0.2–1.2)
Total Protein: 8.1 g/dL (ref 6.0–8.3)

## 2023-08-02 LAB — CBC WITH DIFFERENTIAL/PLATELET
Basophils Absolute: 0.1 10*3/uL (ref 0.0–0.1)
Basophils Relative: 1.2 % (ref 0.0–3.0)
Eosinophils Absolute: 0.1 10*3/uL (ref 0.0–0.7)
Eosinophils Relative: 1.3 % (ref 0.0–5.0)
HCT: 40.2 % (ref 39.0–52.0)
Hemoglobin: 13.1 g/dL (ref 13.0–17.0)
Lymphocytes Relative: 18.9 % (ref 12.0–46.0)
Lymphs Abs: 0.8 10*3/uL (ref 0.7–4.0)
MCHC: 32.6 g/dL (ref 30.0–36.0)
MCV: 93.1 fl (ref 78.0–100.0)
Monocytes Absolute: 0.4 10*3/uL (ref 0.1–1.0)
Monocytes Relative: 10.5 % (ref 3.0–12.0)
Neutro Abs: 2.9 10*3/uL (ref 1.4–7.7)
Neutrophils Relative %: 68.1 % (ref 43.0–77.0)
Platelets: 284 10*3/uL (ref 150.0–400.0)
RBC: 4.32 Mil/uL (ref 4.22–5.81)
RDW: 16.4 % — ABNORMAL HIGH (ref 11.5–15.5)
WBC: 4.2 10*3/uL (ref 4.0–10.5)

## 2023-08-02 LAB — LIPID PANEL
Cholesterol: 244 mg/dL — ABNORMAL HIGH (ref 0–200)
HDL: 91.7 mg/dL (ref >39.00)
LDL Cholesterol: 131 mg/dL — ABNORMAL HIGH (ref 0–99)
NonHDL: 151.96
Total CHOL/HDL Ratio: 3
Triglycerides: 106 mg/dL (ref 0.0–149.0)
VLDL: 21.2 mg/dL (ref 0.0–40.0)

## 2023-08-02 LAB — VITAMIN B12: Vitamin B-12: 177 pg/mL — ABNORMAL LOW (ref 211–911)

## 2023-08-02 LAB — HEMOGLOBIN A1C: Hgb A1c MFr Bld: 5.5 % (ref 4.6–6.5)

## 2023-08-02 LAB — TSH: TSH: 0.66 u[IU]/mL (ref 0.35–5.50)

## 2023-08-02 MED ORDER — AMLODIPINE-OLMESARTAN 10-20 MG PO TABS: 1.0000 | ORAL_TABLET | Freq: Every day | ORAL | 0 refills | Status: DC

## 2023-08-02 MED ORDER — ROSUVASTATIN CALCIUM 10 MG PO TABS: 10.0000 mg | ORAL_TABLET | Freq: Every day | ORAL | 0 refills | Status: DC

## 2023-08-02 NOTE — Patient Instructions (Addendum)
      Blood work was ordered.       Medications changes include :   restart your blood pressure and cholesterol medication     Return in about 3 months (around 11/02/2023) for follow up with PCP.

## 2023-08-03 ENCOUNTER — Telehealth: Payer: Self-pay | Admitting: Internal Medicine

## 2023-08-03 NOTE — Addendum Note (Signed)
 Addended by: Colene Dauphin on: 08/03/2023 04:57 PM   Modules accepted: Orders

## 2023-08-03 NOTE — Telephone Encounter (Signed)
 Patient has been made aware that his results aren't resulted yet and that somebody will reach out to him. He gave a verbal understanding.

## 2023-08-03 NOTE — Telephone Encounter (Signed)
 Copied from CRM [id number]. Topic: General - Other >> Aug 03, 2023  1:15 PM Emylou G wrote: Reason for CRM: Please call patient looking for lab results

## 2023-08-08 ENCOUNTER — Telehealth: Payer: Self-pay

## 2023-08-08 NOTE — Telephone Encounter (Signed)
 Copied from CRM [id number]. Topic: Clinical - Lab/Test Results >> Aug 08, 2023  8:44 AM Allyne Areola wrote: Reason for CRM: Patient is calling for lab results, advised the patient of the message Dr.Burns left. He understood and would like to know what time of day he should be taking his cholesterol medication either morning or at night.

## 2023-08-09 ENCOUNTER — Ambulatory Visit: Payer: Self-pay

## 2023-08-09 NOTE — Telephone Encounter (Signed)
 Patient has been made aware when he can take his CRESTOR . Patient has also been scheduled for his 1st b12 injection

## 2023-08-16 ENCOUNTER — Ambulatory Visit (INDEPENDENT_AMBULATORY_CARE_PROVIDER_SITE_OTHER)

## 2023-08-16 DIAGNOSIS — E538 Deficiency of other specified B group vitamins: Secondary | ICD-10-CM | POA: Diagnosis not present

## 2023-08-16 MED ORDER — CYANOCOBALAMIN 1000 MCG/ML IJ SOLN
1000.0000 ug | Freq: Once | INTRAMUSCULAR | Status: AC
  Administered 2023-08-16: 1000 ug via INTRAMUSCULAR

## 2023-08-16 NOTE — Progress Notes (Signed)
 After obtaining consent, and per orders of Dr. Yetta Barre, injection of B12 given by Ferdie Ping. Patient instructed to report any adverse reaction to me immediately.

## 2023-08-24 ENCOUNTER — Ambulatory Visit

## 2023-08-24 ENCOUNTER — Ambulatory Visit (INDEPENDENT_AMBULATORY_CARE_PROVIDER_SITE_OTHER)

## 2023-08-24 DIAGNOSIS — E538 Deficiency of other specified B group vitamins: Secondary | ICD-10-CM | POA: Diagnosis not present

## 2023-08-24 MED ORDER — CYANOCOBALAMIN 1000 MCG/ML IJ SOLN
1000.0000 ug | Freq: Once | INTRAMUSCULAR | Status: AC
  Administered 2023-08-24: 1000 ug via INTRAMUSCULAR

## 2023-08-24 NOTE — Progress Notes (Signed)
 Patient visits today for their b-12 injection. Patient informed of what they had received and tolerated injection well. Patient notified to reach out to office if needed.

## 2023-08-31 ENCOUNTER — Ambulatory Visit (INDEPENDENT_AMBULATORY_CARE_PROVIDER_SITE_OTHER)

## 2023-08-31 ENCOUNTER — Other Ambulatory Visit (INDEPENDENT_AMBULATORY_CARE_PROVIDER_SITE_OTHER)

## 2023-08-31 DIAGNOSIS — E876 Hypokalemia: Secondary | ICD-10-CM

## 2023-08-31 DIAGNOSIS — E538 Deficiency of other specified B group vitamins: Secondary | ICD-10-CM | POA: Diagnosis not present

## 2023-08-31 MED ORDER — CYANOCOBALAMIN 1000 MCG/ML IJ SOLN
1000.0000 ug | Freq: Once | INTRAMUSCULAR | Status: AC
  Administered 2023-08-31: 1000 ug via INTRAMUSCULAR

## 2023-08-31 NOTE — Progress Notes (Signed)
 Pt here for monthly B12 injection per   B12 1000mcg given IM and pt tolerated injection well.  Patient responded well to B12

## 2023-09-01 ENCOUNTER — Telehealth: Payer: Self-pay | Admitting: Internal Medicine

## 2023-09-01 LAB — BASIC METABOLIC PANEL WITH GFR
BUN: 13 mg/dL (ref 6–23)
CO2: 20 meq/L (ref 19–32)
Calcium: 9.3 mg/dL (ref 8.4–10.5)
Chloride: 101 meq/L (ref 96–112)
Creatinine, Ser: 0.8 mg/dL (ref 0.40–1.50)
GFR: 96.51 mL/min (ref >60.00)
Glucose, Bld: 92 mg/dL (ref 70–99)
Potassium: 3.4 meq/L — ABNORMAL LOW (ref 3.5–5.1)
Sodium: 138 meq/L (ref 135–145)

## 2023-09-01 MED ORDER — POTASSIUM CHLORIDE CRYS ER 20 MEQ PO TBCR: 20.0000 meq | EXTENDED_RELEASE_TABLET | Freq: Every day | ORAL | 3 refills | Status: DC

## 2023-09-01 NOTE — Telephone Encounter (Signed)
 Copied from CRM [id number]. Topic: General - Other >> Sep 01, 2023  1:38 PM Dorisann Garre T wrote: Reason for CRM: patient is calling in regarding his lab results

## 2023-09-05 NOTE — Telephone Encounter (Signed)
Called patient and informed him of lab results. Patient verbalized understanding

## 2023-09-07 ENCOUNTER — Ambulatory Visit

## 2023-09-07 DIAGNOSIS — E538 Deficiency of other specified B group vitamins: Secondary | ICD-10-CM

## 2023-09-07 MED ORDER — CYANOCOBALAMIN 1000 MCG/ML IJ SOLN
1000.0000 ug | Freq: Once | INTRAMUSCULAR | Status: AC
  Administered 2023-09-07: 1000 ug via INTRAMUSCULAR

## 2023-09-07 NOTE — Progress Notes (Signed)
Pt was given B12 injection with no complications.

## 2023-10-09 ENCOUNTER — Ambulatory Visit

## 2023-10-18 ENCOUNTER — Emergency Department (HOSPITAL_BASED_OUTPATIENT_CLINIC_OR_DEPARTMENT_OTHER)
Admission: EM | Admit: 2023-10-18 | Discharge: 2023-10-18 | Disposition: A | Discharge status: Home / Self Care | Attending: Emergency Medicine | Admitting: Emergency Medicine

## 2023-10-18 ENCOUNTER — Other Ambulatory Visit: Payer: Self-pay

## 2023-10-18 ENCOUNTER — Encounter (HOSPITAL_BASED_OUTPATIENT_CLINIC_OR_DEPARTMENT_OTHER): Payer: Self-pay | Admitting: Emergency Medicine

## 2023-10-18 DIAGNOSIS — N179 Acute kidney failure, unspecified: Secondary | ICD-10-CM | POA: Insufficient documentation

## 2023-10-18 DIAGNOSIS — E860 Dehydration: Secondary | ICD-10-CM | POA: Insufficient documentation

## 2023-10-18 DIAGNOSIS — R55 Syncope and collapse: Secondary | ICD-10-CM | POA: Insufficient documentation

## 2023-10-18 DIAGNOSIS — I10 Essential (primary) hypertension: Secondary | ICD-10-CM | POA: Insufficient documentation

## 2023-10-18 LAB — CK: Total CK: 194 U/L (ref 49–397)

## 2023-10-18 LAB — COMPREHENSIVE METABOLIC PANEL WITH GFR
ALT: 40 U/L (ref 0–44)
AST: 109 U/L — ABNORMAL HIGH (ref 15–41)
Albumin: 4.3 g/dL (ref 3.5–5.0)
Alkaline Phosphatase: 66 U/L (ref 38–126)
Anion gap: 17 — ABNORMAL HIGH (ref 5–15)
BUN: 13 mg/dL (ref 6–20)
CO2: 18 mmol/L — ABNORMAL LOW (ref 22–32)
Calcium: 9.7 mg/dL (ref 8.9–10.3)
Chloride: 99 mmol/L (ref 98–111)
Creatinine, Ser: 1.88 mg/dL — ABNORMAL HIGH (ref 0.61–1.24)
GFR, Estimated: 40 mL/min — ABNORMAL LOW (ref >60)
Glucose, Bld: 96 mg/dL (ref 70–99)
Potassium: 3.8 mmol/L (ref 3.5–5.1)
Sodium: 135 mmol/L (ref 135–145)
Total Bilirubin: 0.5 mg/dL (ref 0.0–1.2)
Total Protein: 7.3 g/dL (ref 6.5–8.1)

## 2023-10-18 LAB — CBC WITH DIFFERENTIAL/PLATELET
Abs Immature Granulocytes: 0.01 K/uL (ref 0.00–0.07)
Basophils Absolute: 0 K/uL (ref 0.0–0.1)
Basophils Relative: 1 %
Eosinophils Absolute: 0.1 K/uL (ref 0.0–0.5)
Eosinophils Relative: 2 %
HCT: 35 % — ABNORMAL LOW (ref 39.0–52.0)
Hemoglobin: 11.8 g/dL — ABNORMAL LOW (ref 13.0–17.0)
Immature Granulocytes: 0 %
Lymphocytes Relative: 23 %
Lymphs Abs: 1 K/uL (ref 0.7–4.0)
MCH: 29.3 pg (ref 26.0–34.0)
MCHC: 33.7 g/dL (ref 30.0–36.0)
MCV: 86.8 fL (ref 80.0–100.0)
Monocytes Absolute: 0.6 K/uL (ref 0.1–1.0)
Monocytes Relative: 12 %
Neutro Abs: 2.8 K/uL (ref 1.7–7.7)
Neutrophils Relative %: 62 %
Platelets: 180 K/uL (ref 150–400)
RBC: 4.03 MIL/uL — ABNORMAL LOW (ref 4.22–5.81)
RDW: 14.8 % (ref 11.5–15.5)
WBC: 4.6 K/uL (ref 4.0–10.5)
nRBC: 0 % (ref 0.0–0.2)

## 2023-10-18 LAB — TROPONIN T, HIGH SENSITIVITY
Troponin T High Sensitivity: 28 ng/L — ABNORMAL HIGH (ref <19)
Troponin T High Sensitivity: 19 ng/L — ABNORMAL HIGH (ref <19)

## 2023-10-18 LAB — CREATININE, SERUM
Creatinine, Ser: 1.46 mg/dL — ABNORMAL HIGH (ref 0.61–1.24)
GFR, Estimated: 55 mL/min — ABNORMAL LOW (ref >60)

## 2023-10-18 LAB — ETHANOL: Alcohol, Ethyl (B): 206 mg/dL — ABNORMAL HIGH (ref <15)

## 2023-10-18 LAB — LIPASE, BLOOD: Lipase: 79 U/L — ABNORMAL HIGH (ref 11–51)

## 2023-10-18 MED ORDER — SODIUM CHLORIDE 0.9 % IV BOLUS
1000.0000 mL | Freq: Once | INTRAVENOUS | Status: AC
  Administered 2023-10-18: 1000 mL via INTRAVENOUS

## 2023-10-18 NOTE — ED Provider Notes (Signed)
 Emergency Department Provider Note   I have reviewed the triage vital signs and the nursing notes.   HISTORY  Chief Complaint Hypotension   HPI [name] is a 58 y.o. male past history reviewed below presents to the emergency department with near syncope event and hypotension after eating dinner.  Patient went out to [facility].  He states that dinner went fine.  He had a [place] ice tea and was walking back to the car when he felt suddenly very lightheaded and began to pass out.  He did not fully lose consciousness.  His wife was able to break his fall.  They actually went home to check on symptoms and his wife found that he was hypotensive with systolic pressure in the 70s.  He denies any chest pain or palpitations.  No shortness of breath.  No fevers or chills.  No medication changes other than a change in the time of day he takes his amlodipine  to earlier in the day.  He has been working outside cutting trees in the heat.  Unsure about staying well-hydrated.   Past Medical History:  Diagnosis Date   Allergy    Arthritis    Compartment syndrome (HCC)    right arm    GERD (gastroesophageal reflux disease)    History of kidney stones    Hypertension    self reported    Review of Systems  Constitutional: No fever/chills. Positive lightheadedness.  Cardiovascular: Denies chest pain. Respiratory: Denies shortness of breath. Gastrointestinal: No abdominal pain.  No nausea, no vomiting.   Skin: Negative for rash. Neurological: Negative for headaches, focal weakness.  ____________________________________________   PHYSICAL EXAM:  VITAL SIGNS: ED Triage Vitals  Encounter Vitals Group     BP 10/18/23 1904 (!) 77/57     Pulse Rate 10/18/23 1904 88     Resp 10/18/23 1904 (!) 25     Temp 10/18/23 1904 98.3 F (36.8 C)     Temp src --      SpO2 10/18/23 1904 95 %     Weight 10/18/23 1904 187 lb (84.8 kg)     Height 10/18/23 1904 5' 7 (1.702 m)    Constitutional: Alert and oriented. Well appearing and in no acute distress. Eyes: Conjunctivae are normal. Head: Atraumatic. Nose: No congestion/rhinnorhea. Mouth/Throat: Mucous membranes are moist.  Neck: No stridor.   Cardiovascular: Normal rate, regular rhythm. Good peripheral circulation. Grossly normal heart sounds.   Respiratory: Normal respiratory effort.  No retractions. Lungs CTAB. Gastrointestinal: Soft and nontender. No distention.  Musculoskeletal: No lower extremity tenderness nor edema. No gross deformities of extremities. Neurologic:  Normal speech and language. No gross focal neurologic deficits are appreciated.    ____________________________________________   LABS (all labs ordered are listed, but only abnormal results are displayed)  Labs Reviewed  COMPREHENSIVE METABOLIC PANEL WITH GFR - Abnormal; Notable for the following components:      Result Value   CO2 18 (*)    Creatinine, Ser 1.88 (*)    AST 109 (*)    GFR, Estimated 40 (*)    Anion gap 17 (*)    All other components within normal limits  ETHANOL - Abnormal; Notable for the following components:   Alcohol, Ethyl (B) 206 (*)    All other components within normal limits  CBC WITH DIFFERENTIAL/PLATELET - Abnormal; Notable for the following components:   RBC 4.03 (*)    Hemoglobin 11.8 (*)    HCT 35.0 (*)    All  other components within normal limits  LIPASE, BLOOD - Abnormal; Notable for the following components:   Lipase 79 (*)    All other components within normal limits  CREATININE, SERUM - Abnormal; Notable for the following components:   Creatinine, Ser 1.46 (*)    GFR, Estimated 55 (*)    All other components within normal limits  TROPONIN T, HIGH SENSITIVITY - Abnormal; Notable for the following components:   Troponin T High Sensitivity 28 (*)    All other components within normal limits  TROPONIN T, HIGH SENSITIVITY - Abnormal; Notable for the following components:   Troponin T High  Sensitivity 19 (*)    All other components within normal limits  CK   ____________________________________________  EKG   EKG Interpretation Date/Time:  Wednesday October 18 2023 19:04:21 EDT Ventricular Rate:  90 PR Interval:  159 QRS Duration:  85 QT Interval:  354 QTC Calculation: 434 R Axis:   53  Text Interpretation: Sinus rhythm Abnormal R-wave progression, early transition Confirmed by Darra Chew [phone]) on 10/18/2023 7:34:32 PM        ____________________________________________   PROCEDURES  Procedure(s) performed:   Procedures  CRITICAL CARE Performed by: Chew KANDICE Darra Total critical care time: 35 minutes Critical care time was exclusive of separately billable procedures and treating other patients. Critical care was necessary to treat or prevent imminent or life-threatening deterioration. Critical care was time spent personally by me on the following activities: development of treatment plan with patient and/or surrogate as well as nursing, discussions with consultants, evaluation of patient's response to treatment, examination of patient, obtaining history from patient or surrogate, ordering and performing treatments and interventions, ordering and review of laboratory studies, ordering and review of radiographic studies, pulse oximetry and re-evaluation of patient's condition.  Chew Darra, MD Emergency Medicine  ____________________________________________   INITIAL IMPRESSION / ASSESSMENT AND PLAN / ED COURSE  Pertinent labs & imaging results that were available during my care of the patient were reviewed by me and considered in my medical decision making (see chart for details).   This patient is Presenting for Evaluation of syncope, which does require a range of treatment options, and is a complaint that involves a high risk of morbidity and mortality.  The Differential Diagnoses include dehydration, hemorrhagic shock, sepsis, etc.  Critical  Interventions-    Medications  sodium chloride  0.9 % bolus 1,000 mL (0 mLs Intravenous Stopped 10/18/23 2105)    Reassessment after intervention:  BP improved.    I did obtain Additional Historical Information from wife at bedside.    Clinical Laboratory Tests Ordered, included AKI improved with IVF bolus here. No leukocytosis. CK normal. Troponin minimally elevated; likely demand with hypotension.   Cardiac Monitor Tracing which shows NSR.    Social Determinants of Health Risk patient drinks EtOH occasionally.   Medical Decision Making: Summary:  The patient presents to the emergency department with near syncope and hypotension.  Feeling well at this point.  On my assessment patient with systolic blood pressure in the 90s.  No markers to strongly suspect sepsis.  EKG with no acute ischemic change.  He has been working out in the heat with temperatures in the 90s.  Question acute dehydration.  He is also been drinking alcohol this evening.  Plan for IV fluids, screening blood work and reassess.  Reevaluation with update and discussion with patient. Feeling much better after IVF. BP improved. Creatinine improved. Plan for d/c plan for home hydration and close PCP follow  up.   Considered admission but labs and vital improving.   Patient's presentation is most consistent with acute presentation with potential threat to life or bodily function.   Disposition: discharge  ____________________________________________  FINAL CLINICAL IMPRESSION(S) / ED DIAGNOSES  Final diagnoses:  AKI (acute kidney injury) (HCC)  Dehydration  Near syncope    Note:  This document was prepared using Dragon voice recognition software and may include unintentional dictation errors.  Fonda Law, MD, [facility] Emergency [facility]    Hezikiah Retzloff, Fonda MATSU, MD 10/20/23 1026

## 2023-10-18 NOTE — ED Notes (Signed)
 Patient present with low blood pressure. Endorse this has not happened before. Bp at home was 72/64. Was found on the floor. Pt was responsive when found. Sts he has not had any BP  medications today. Sts he took Neurontin and B12 Stated his vision was blurry.

## 2023-10-18 NOTE — Discharge Instructions (Signed)

## 2023-10-18 NOTE — ED Triage Notes (Signed)
 Pt reports he fell when he got out of the car; sts he doesn't feel well; family member said BP was low (SBP 60s, 70s); c/o RT elbow pain; denies other sxs, A & O x 4 in triage; hypotensive in triage

## 2023-10-28 ENCOUNTER — Other Ambulatory Visit: Payer: Self-pay | Admitting: Internal Medicine

## 2023-10-31 ENCOUNTER — Ambulatory Visit: Payer: Self-pay | Admitting: *Deleted

## 2023-10-31 NOTE — Telephone Encounter (Signed)
 Copied from CRM [id number]. Topic: Clinical - Red Word Triage >> Oct 31, 2023  3:50 PM Mia F wrote: Red Word that prompted transfer to Nurse Triage: Feet numbness and needle feelings. No swelling. Feels like swelling but not swollen. Reason for Disposition  [1] Numbness or tingling in one or both feet AND [2] is a chronic symptom (recurrent or ongoing AND present > 4 weeks)  Answer Assessment - Initial Assessment Questions 1. SYMPTOM: What is the main symptom you are concerned about? (e.g., weakness, numbness)     I'm having numbness and needle like feelings in my feet.   I saw the doctor already for this.   I'm on B12 shots for this.   I'm taking the medicine she prescribed for me.    I've had this for a while.   This is not new for me.   I did not triage him any further after this since this is not new and he is being treated for it.   He has a hospital follow up appt on Thur. 11/02/2023. He was transferred to me by the Patient Access Specialist since he mentioned having numbness and needle like feelings in his feet.  2. ONSET: When did this start? (e.g., minutes, hours, days; while sleeping)      3. LAST NORMAL: When was the last time you (the patient) were normal (no symptoms)?      4. PATTERN Does this come and go, or has it been constant since it started?  Is it present now?      5. CARDIAC SYMPTOMS: Have you had any of the following symptoms: chest pain, difficulty breathing, palpitations?      6. NEUROLOGIC SYMPTOMS: Have you had any of the following symptoms: headache, dizziness, vision loss, double vision, changes in speech, unsteady on your feet?      7. OTHER SYMPTOMS: Do you have any other symptoms?      8. PREGNANCY: Is there any chance you are pregnant? When was your last menstrual period?  Protocols used: Neurologic Deficit-A-AH FYI Only or Action Required?: FYI only for provider.  Patient was last seen in primary care on 08/02/2023 by Geofm Glade PARAS,  MD.  Called Nurse Triage reporting Neurologic Problem.  Symptoms began several months ago. This is not new for me.    I've been dealing with this and am taking the medicine she prescribed.    Interventions attempted: Prescription medications: B 12 shots.  Symptoms are: unchanged.  Triage Disposition: See PCP When Office is Open (Within 3 Days)  Patient/caregiver understands and will follow disposition?: Yes

## 2023-11-02 ENCOUNTER — Encounter: Payer: Self-pay | Admitting: Internal Medicine

## 2023-11-02 ENCOUNTER — Ambulatory Visit: Admitting: Internal Medicine

## 2023-11-02 VITALS — BP 126/72 | HR 94 | Temp 98.8°F | Ht 67.0 in | Wt 185.8 lb

## 2023-11-02 DIAGNOSIS — I10 Essential (primary) hypertension: Secondary | ICD-10-CM

## 2023-11-02 DIAGNOSIS — F1020 Alcohol dependence, uncomplicated: Secondary | ICD-10-CM | POA: Diagnosis not present

## 2023-11-02 DIAGNOSIS — D518 Other vitamin B12 deficiency anemias: Secondary | ICD-10-CM

## 2023-11-02 DIAGNOSIS — M1A09X0 Idiopathic chronic gout, multiple sites, without tophus (tophi): Secondary | ICD-10-CM

## 2023-11-02 DIAGNOSIS — D520 Dietary folate deficiency anemia: Secondary | ICD-10-CM

## 2023-11-02 DIAGNOSIS — H6122 Impacted cerumen, left ear: Secondary | ICD-10-CM | POA: Diagnosis not present

## 2023-11-02 DIAGNOSIS — E785 Hyperlipidemia, unspecified: Secondary | ICD-10-CM

## 2023-11-02 DIAGNOSIS — R972 Elevated prostate specific antigen [PSA]: Secondary | ICD-10-CM | POA: Diagnosis not present

## 2023-11-02 DIAGNOSIS — D539 Nutritional anemia, unspecified: Secondary | ICD-10-CM | POA: Diagnosis not present

## 2023-11-02 DIAGNOSIS — F0280 Dementia in other diseases classified elsewhere without behavioral disturbance: Secondary | ICD-10-CM

## 2023-11-02 DIAGNOSIS — E519 Thiamine deficiency, unspecified: Secondary | ICD-10-CM

## 2023-11-02 DIAGNOSIS — K7010 Alcoholic hepatitis without ascites: Secondary | ICD-10-CM | POA: Diagnosis not present

## 2023-11-02 LAB — CBC WITH DIFFERENTIAL/PLATELET
Basophils Absolute: 0 K/uL (ref 0.0–0.1)
Basophils Relative: 0.9 % (ref 0.0–3.0)
Eosinophils Absolute: 0.1 K/uL (ref 0.0–0.7)
Eosinophils Relative: 1.4 % (ref 0.0–5.0)
HCT: 34.9 % — ABNORMAL LOW (ref 39.0–52.0)
Hemoglobin: 11.5 g/dL — ABNORMAL LOW (ref 13.0–17.0)
Lymphocytes Relative: 15.9 % (ref 12.0–46.0)
Lymphs Abs: 0.7 K/uL (ref 0.7–4.0)
MCHC: 32.9 g/dL (ref 30.0–36.0)
MCV: 89 fl (ref 78.0–100.0)
Monocytes Absolute: 0.6 K/uL (ref 0.1–1.0)
Monocytes Relative: 12.5 % — ABNORMAL HIGH (ref 3.0–12.0)
Neutro Abs: 3.1 K/uL (ref 1.4–7.7)
Neutrophils Relative %: 69.3 % (ref 43.0–77.0)
Platelets: 249 K/uL (ref 150.0–400.0)
RBC: 3.92 Mil/uL — ABNORMAL LOW (ref 4.22–5.81)
RDW: 15.4 % (ref 11.5–15.5)
WBC: 4.5 K/uL (ref 4.0–10.5)

## 2023-11-02 LAB — BASIC METABOLIC PANEL WITH GFR
BUN: 7 mg/dL (ref 6–23)
CO2: 25 meq/L (ref 19–32)
Calcium: 9.5 mg/dL (ref 8.4–10.5)
Chloride: 98 meq/L (ref 96–112)
Creatinine, Ser: 0.63 mg/dL (ref 0.40–1.50)
GFR: 103.6 mL/min (ref >60.00)
Glucose, Bld: 91 mg/dL (ref 70–99)
Potassium: 3.9 meq/L (ref 3.5–5.1)
Sodium: 140 meq/L (ref 135–145)

## 2023-11-02 LAB — HEPATIC FUNCTION PANEL
ALT: 31 U/L (ref 0–53)
AST: 51 U/L — ABNORMAL HIGH (ref 0–37)
Albumin: 4.4 g/dL (ref 3.5–5.2)
Alkaline Phosphatase: 58 U/L (ref 39–117)
Bilirubin, Direct: 0.2 mg/dL (ref 0.0–0.3)
Total Bilirubin: 0.6 mg/dL (ref 0.2–1.2)
Total Protein: 7.4 g/dL (ref 6.0–8.3)

## 2023-11-02 LAB — IBC + FERRITIN
Ferritin: 290.5 ng/mL (ref 22.0–322.0)
Iron: 73 ug/dL (ref 42–165)
Saturation Ratios: 22.5 % (ref 20.0–50.0)
TIBC: 324.8 ug/dL (ref 250.0–450.0)
Transferrin: 232 mg/dL (ref 212.0–360.0)

## 2023-11-02 LAB — PROTIME-INR
INR: 1.4 ratio — ABNORMAL HIGH (ref 0.8–1.0)
Prothrombin Time: 15.1 s — ABNORMAL HIGH (ref 9.6–13.1)

## 2023-11-02 LAB — FOLATE: Folate: 3.5 ng/mL — ABNORMAL LOW (ref >5.9)

## 2023-11-02 LAB — PSA: PSA: 7.29 ng/mL — ABNORMAL HIGH (ref 0.10–4.00)

## 2023-11-02 LAB — URIC ACID: Uric Acid, Serum: 6.2 mg/dL (ref 4.0–7.8)

## 2023-11-02 MED ORDER — THIAMINE HCL 100 MG PO TABS: 100.0000 mg | ORAL_TABLET | Freq: Every day | ORAL | 0 refills | Status: DC

## 2023-11-02 MED ORDER — CYANOCOBALAMIN 1000 MCG/ML IJ SOLN
1000.0000 ug | Freq: Once | INTRAMUSCULAR | Status: AC
  Administered 2023-11-02: 1000 ug via INTRAMUSCULAR

## 2023-11-02 MED ORDER — FOLIC ACID 1 MG PO TABS: 1.0000 mg | ORAL_TABLET | Freq: Every day | ORAL | 1 refills | Status: AC

## 2023-11-02 MED ORDER — AMLODIPINE BESYLATE 5 MG PO TABS: 5.0000 mg | ORAL_TABLET | Freq: Every day | ORAL | 0 refills | Status: DC

## 2023-11-02 MED ORDER — ROSUVASTATIN CALCIUM 10 MG PO TABS: 10.0000 mg | ORAL_TABLET | Freq: Every day | ORAL | 0 refills | Status: AC

## 2023-11-02 MED ORDER — CYANOCOBALAMIN 2000 MCG PO TABS: 2000.0000 ug | ORAL_TABLET | Freq: Every day | ORAL | 0 refills | Status: DC

## 2023-11-02 MED ORDER — NALTREXONE HCL 50 MG PO TABS
50.0000 mg | ORAL_TABLET | Freq: Every day | ORAL | 0 refills | Status: DC
Start: 2023-11-02 — End: 2023-12-26

## 2023-11-02 NOTE — Telephone Encounter (Signed)
 Patient is being seen in office today to discuss this.

## 2023-11-02 NOTE — Progress Notes (Signed)
 Subjective:  Patient ID: [name], male    DOB: 01-02-1966  Age: 58 y.o. MRN: [id number]  CC: Medical Management of Chronic Issues (3 month follow up./Pins and needles feeling in his feet./Bleeding out of his left ear. ), Hypertension, and Anemia   HPI [name] presents for f/up ----  Discussed the use of AI scribe software for clinical note transcription with the patient, who gave verbal consent to proceed.  History of Present Illness [name] is a 58 year old male with B12 deficiency who presents with worsening numbness and tingling in his feet.  He experiences numbness and tingling in his feet, which began while working on a concrete floor and have recently worsened, progressing upwards and accompanied by weakness. Despite receiving B12 injections initially weekly and then monthly, the symptoms persist. He also takes a daily potassium supplement, which he finds difficult to swallow and must chew.  He consumes a six-pack of beer daily and smokes cigarettes occasionally. He acknowledges that alcohol might affect his nerves. No nausea or vomiting, except during hangovers, the last of which occurred a week ago.  He reports a recent episode of bleeding from his left ear, accompanied by itching, numbness, and occasional earaches. He describes a ringing sound in the left ear but denies any hearing loss. He manages the earache with Tylenol .  He experiences erectile dysfunction, with difficulty achieving a full erection, but denies any issues with urination.    Outpatient Medications Prior to Visit  Medication Sig Dispense Refill   amlodipine -olmesartan  (AZOR ) 10-20 MG tablet Take 1 tablet by mouth daily. 90 tablet 0   rosuvastatin  (CRESTOR ) 10 MG tablet Take 1 tablet (10 mg total) by mouth daily. 90 tablet 0   potassium chloride  SA (KLOR-CON  M) 20 MEQ tablet Take 1 tablet (20 mEq total) by mouth daily. (Patient not taking: Reported on 11/02/2023) 90 tablet 3   No  facility-administered medications prior to visit.    ROS Review of Systems  Constitutional:  Negative for appetite change, chills, diaphoresis, fatigue and fever.  HENT:  Positive for ear pain and hearing loss. Negative for ear discharge, sinus pressure, sore throat and trouble swallowing.   Respiratory: Negative.  Negative for cough, chest tightness, shortness of breath and wheezing.   Cardiovascular:  Negative for chest pain, palpitations and leg swelling.  Gastrointestinal: Negative.  Negative for abdominal pain, blood in stool, constipation, diarrhea, nausea and vomiting.  Genitourinary:  Negative for difficulty urinating, dysuria and hematuria.  Musculoskeletal: Negative.  Negative for arthralgias and myalgias.  Skin: Negative.   Neurological:  Positive for numbness. Negative for dizziness and weakness.  Hematological:  Negative for adenopathy. Does not bruise/bleed easily.  Psychiatric/Behavioral:  Positive for confusion and decreased concentration. Negative for self-injury. The patient is not nervous/anxious.     Objective:  BP 126/72 (BP Location: Left Arm, Patient Position: Sitting, Cuff Size: Normal)   Pulse 94   Temp 98.8 F (37.1 C) (Oral)   Ht 5' 7 (1.702 m)   Wt 185 lb 12.8 oz (84.3 kg)   SpO2 99%   BMI 29.10 kg/m   BP Readings from Last 3 Encounters:  11/09/23 130/80  11/02/23 126/72  10/18/23 104/61    Wt Readings from Last 3 Encounters:  11/02/23 185 lb 12.8 oz (84.3 kg)  10/18/23 187 lb (84.8 kg)  08/02/23 184 lb (83.5 kg)    Physical Exam Vitals reviewed.  Constitutional:      Appearance: Normal appearance.  HENT:  Right Ear: Hearing, tympanic membrane, ear canal and external ear normal.     Left Ear: Decreased hearing noted. There is impacted cerumen.     Mouth/Throat:     Mouth: Mucous membranes are moist.  Eyes:     General: No scleral icterus.    Conjunctiva/sclera: Conjunctivae normal.  Cardiovascular:     Rate and Rhythm: Normal rate  and regular rhythm.     Heart sounds: No murmur heard.    No friction rub. No gallop.  Pulmonary:     Effort: Pulmonary effort is normal.     Breath sounds: No stridor. No wheezing, rhonchi or rales.  Abdominal:     General: Abdomen is flat.     Palpations: There is no mass.     Tenderness: There is no abdominal tenderness. There is no guarding.     Hernia: No hernia is present.  Musculoskeletal:        General: Normal range of motion.     Cervical back: Neck supple.     Right lower leg: No edema.     Left lower leg: No edema.  Lymphadenopathy:     Cervical: No cervical adenopathy.  Skin:    General: Skin is warm and dry.     Coloration: Skin is not pale.  Neurological:     General: No focal deficit present.     Mental Status: He is alert. Mental status is at baseline.  Psychiatric:        Attention and Perception: He is inattentive.        Mood and Affect: Mood normal. Mood is not anxious.        Speech: Speech normal.        Behavior: Behavior normal.        Thought Content: Thought content normal.        Cognition and Memory: Cognition is impaired. Memory is impaired.        Judgment: Judgment normal.     Lab Results  Component Value Date   WBC 4.5 11/02/2023   HGB 11.5 (L) 11/02/2023   HCT 34.9 (L) 11/02/2023   PLT 249.0 11/02/2023   GLUCOSE 91 11/02/2023   CHOL 244 (H) 08/02/2023   TRIG 106.0 08/02/2023   HDL 91.70 08/02/2023   LDLCALC 131 (H) 08/02/2023   ALT 31 11/02/2023   AST 51 (H) 11/02/2023   NA 140 11/02/2023   K 3.9 11/02/2023   CL 98 11/02/2023   CREATININE 0.63 11/02/2023   BUN 7 11/02/2023   CO2 25 11/02/2023   TSH 0.66 08/02/2023   PSA 7.29 (H) 11/02/2023   INR 1.4 (H) 11/02/2023   HGBA1C 5.5 08/02/2023    No results found.  Assessment & Plan:  Hearing loss secondary to cerumen impaction, left -     Ambulatory referral to ENT  Deficiency anemia -     Reticulocytes; Future -     Zinc ; Future -     Vitamin B1; Future -     CBC with  Differential/Platelet; Future -     Folate; Future -     IBC + Ferritin; Future -     AMB Referral VBCI Care Management  Alcohol use disorder, moderate, dependence (HCC) -     Thiamine  HCl; Take 1 tablet (100 mg total) by mouth daily.  Dispense: 90 tablet; Refill: 0 -     Naltrexone  HCl; Take 1 tablet (50 mg total) by mouth daily.  Dispense: 90 tablet; Refill: 0 -  AMB Referral VBCI Care Management  PSA elevation -     PSA; Future -     Ambulatory referral to Urology  Dyslipidemia, goal LDL below 100 -     Rosuvastatin  Calcium ; Take 1 tablet (10 mg total) by mouth daily.  Dispense: 90 tablet; Refill: 0  Alcoholic hepatitis without ascites -     Protime-INR; Future -     Hepatic function panel; Future -     AMB Referral VBCI Care Management  Idiopathic chronic gout of multiple sites without tophus -     Basic metabolic panel with GFR; Future -     Uric acid; Future  Essential hypertension- BP is well controlled. -     amLODIPine  Besylate; Take 1 tablet (5 mg total) by mouth daily.  Dispense: 90 tablet; Refill: 0  Vitamin B12 deficiency (dietary) anemia -     Cyanocobalamin ; Take 1 tablet (2,000 mcg total) by mouth daily.  Dispense: 90 tablet; Refill: 0 -     Cyanocobalamin   Dietary folate deficiency anemia -     Folic Acid ; Take 1 tablet (1 mg total) by mouth daily.  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  Other orders -     Extra Specimen     Follow-up: Return in about 3 months (around 02/02/2024).  Debby Molt, MD

## 2023-11-02 NOTE — Patient Instructions (Signed)

## 2023-11-03 ENCOUNTER — Telehealth: Payer: Self-pay | Admitting: *Deleted

## 2023-11-03 NOTE — Progress Notes (Signed)
 Complex Care Management Note  Care Guide Note 11/03/2023 Name: [name] MRN: [id number] DOB: 30-Apr-1965  [name] is a 58 y.o. year old male who sees Joshua Debby CROME, MD for primary care. I reached out to Curtistine Linden by phone today to offer complex care management services.  [name] was given information about Complex Care Management services today including:   The Complex Care Management services include support from the care team which includes your Nurse Care Manager, Clinical Social Worker, or Pharmacist.  The Complex Care Management team is here to help remove barriers to the health concerns and goals most important to you. Complex Care Management services are voluntary, and the patient may decline or stop services at any time by request to their care team member.   Complex Care Management Consent Status: Patient agreed to services and verbal consent obtained.   Follow up plan:  Telephone appointment with complex care management team member scheduled for:  11/13/2023 and 11/28/2023  Encounter Outcome:  Patient Scheduled  Thedford Franks, CMA [place]  [facility], [facility] Guide Direct Dial: [phone]  Fax: [phone] Website: [email or web address]

## 2023-11-07 ENCOUNTER — Telehealth: Payer: Self-pay

## 2023-11-07 NOTE — Telephone Encounter (Signed)
 Copied from CRM [id number]. Topic: Clinical - Lab/Test Results >> Nov 07, 2023  1:32 PM Suzen RAMAN wrote: Reason for CRM: Patient would like a call back to discuss recent lab results.  CB#[phone]

## 2023-11-08 ENCOUNTER — Ambulatory Visit: Payer: Self-pay | Admitting: Internal Medicine

## 2023-11-08 NOTE — Telephone Encounter (Signed)
 Please can you look at this patients labs and make comments so that I can discuss them with him.

## 2023-11-08 NOTE — Telephone Encounter (Signed)
 Copied from CRM #[id number]. Topic: Medical Record Request - Records Request >> Nov 08, 2023 12:24 PM [place] J wrote: Reason for CRM: Patient would like for his recent lab results to be mailed to him as soon as possible.  He is aware of the 7-10 business day turn around time for his mail to arrive.

## 2023-11-09 ENCOUNTER — Ambulatory Visit (INDEPENDENT_AMBULATORY_CARE_PROVIDER_SITE_OTHER): Admitting: Physician Assistant

## 2023-11-09 ENCOUNTER — Encounter (INDEPENDENT_AMBULATORY_CARE_PROVIDER_SITE_OTHER): Payer: Self-pay | Admitting: Physician Assistant

## 2023-11-09 VITALS — BP 130/80 | HR 70

## 2023-11-09 DIAGNOSIS — H6123 Impacted cerumen, bilateral: Secondary | ICD-10-CM

## 2023-11-10 DIAGNOSIS — F0280 Dementia in other diseases classified elsewhere without behavioral disturbance: Secondary | ICD-10-CM | POA: Insufficient documentation

## 2023-11-10 LAB — VITAMIN B1: Vitamin B1 (Thiamine): <6 nmol/L — ABNORMAL LOW (ref 8–30)

## 2023-11-10 LAB — RETICULOCYTES
ABS Retic: 61600 {cells}/uL (ref 25000–90000)
Retic Ct Pct: 1.6 %

## 2023-11-10 LAB — ZINC

## 2023-11-10 LAB — EXTRA SPECIMEN

## 2023-11-10 NOTE — Progress Notes (Signed)
 Dear Dr. Joshua, Here is my assessment for our mutual [name] [name] [name]. Thank you for allowing me [name] opportunity to care for your [name]. Please do not hesitate to contact me should you have any other questions. Sincerely, Chyrl Cohen PA-C  Otolaryngology Clinic Note Referring provider: Dr. Joshua HPI:  [name] [name] is a 58 y.o. male kindly referred by Dr. Joshua   [name] [name] is a 58 year old gentleman seen in our office for evaluation of cerumen impaction.  [name] notes fullness in his ears particularly in [name] left ear.  He notes some blood on a Q-tip.  He saw his primary care and was diagnosed with cerumen impaction.  He was referred to our office for evaluation.  He notes decreased hearing out of [name] left ear, no decreased hearing out of [name] right ear.   Independent Review of Additional Tests or Records:  Primary care office visit note on 11/02/2023   PMH/Meds/All/SocHx/FamHx/ROS:   Past Medical History:  Diagnosis Date   Allergy    Arthritis    Compartment syndrome (HCC)    right arm    GERD (gastroesophageal reflux disease)    History of kidney stones    Hypertension    self reported     Past Surgical History:  Procedure Laterality Date   CARPAL TUNNEL RELEASE Right 2014   COLONOSCOPY     FASCIOTOMY Right    Forearm    Family History  Problem Relation Age of Onset   Cataracts Mother    Dementia Mother    Hypertension Father    Kidney disease Father    Glaucoma Maternal Grandmother    Hypertension Paternal Grandfather    Other Brother        dialysis   Other Brother        dialysis   Colon cancer Neg Hx    Esophageal cancer Neg Hx    Rectal cancer Neg Hx    Stomach cancer Neg Hx      Social Connections: Unknown (07/06/2022)   Social Connection and Isolation Panel    Frequency of Communication with Friends and Family: Three times a week    Frequency of Social Gatherings with Friends and Family: Twice a week    Attends Religious Services:  [name] declined    Database administrator or Organizations: No    Attends Engineer, structural: Not on file    Marital Status: Widowed      Current Outpatient Medications:    amLODipine  (NORVASC ) 5 MG tablet, Take 1 tablet (5 mg total) by mouth daily., Disp: 90 tablet, Rfl: 0   cyanocobalamin  2000 MCG tablet, Take 1 tablet (2,000 mcg total) by mouth daily., Disp: 90 tablet, Rfl: 0   folic acid  (FOLVITE ) 1 MG tablet, Take 1 tablet (1 mg total) by mouth daily., Disp: 90 tablet, Rfl: 1   naltrexone  (DEPADE) 50 MG tablet, Take 1 tablet (50 mg total) by mouth daily., Disp: 90 tablet, Rfl: 0   rosuvastatin  (CRESTOR ) 10 MG tablet, Take 1 tablet (10 mg total) by mouth daily., Disp: 90 tablet, Rfl: 0   thiamine  (VITAMIN B1) 100 MG tablet, Take 1 tablet (100 mg total) by mouth daily., Disp: 90 tablet, Rfl: 0   Physical Exam:   BP 130/80   Pulse 70   SpO2 96%   Pertinent Findings  CN II-XII intact Bilateral cerumen impaction Weber 512: equal Rinne 512: AC > BC b/l  Anterior rhinoscopy: Septum midline; bilateral inferior turbinates with hypertrophy No lesions of  oral cavity/oropharynx; dentition in normal limits No obviously palpable neck masses/lymphadenopathy/thyromegaly No respiratory distress or stridor  Seprately Identifiable Procedures:  Procedure: Bilateral ear microscopy and cerumen removal using microscope (CPT [phone]) - Mod 50 Pre-procedure diagnosis: bilateral cerumen impaction external auditory canals Post-procedure diagnosis: same Indication: bilateral cerumen impaction; given [name]'s otologic complaints and history as well as for improved and comprehensive examination of external ear and tympanic membrane, bilateral otologic examination using microscope was performed and impacted cerumen removed  Procedure: [name] was placed semi-recumbent. Both ear canals were examined using [name] microscope with findings above. Cerumen removed from bilateral external auditory canals  using suction and currette with improvement in EAC examination and patency. Left: EAC was patent. TM was intact . Middle ear was aerated. Drainage: none Right: EAC was patent. TM was intact . Middle ear was aerated . Drainage: none [name] tolerated [name] procedure well.   Impression & Plans:  Yida Hyams is a 58 y.o. male with [name] following   Cerumen impaction-  58 year old male with bilateral cerumen impaction.  I was able to remove [name] cerumen impaction on [name] right without significant difficulty, on [name] left he had some persistent dried blood around [name] external auditory canal that I was unable to remove.  I would recommend using Debrox for [name] next week, would like to see him in [name] office after that point for repeat evaluation and removal of remainder cerumen.  He was given strict return precautions.  He verbalized understanding and agreement to today's plan.  He felt his hearing was back to baseline after removal.   - f/u 1 week   Thank you for allowing me [name] opportunity to care for your [name]. Please do not hesitate to contact me should you have any other questions.  Sincerely, Chyrl Cohen PA-C [facility] Phone: [phone] Fax: [phone]  11/10/2023, 9:53 AM

## 2023-11-13 ENCOUNTER — Other Ambulatory Visit: Payer: Self-pay

## 2023-11-13 NOTE — Patient Outreach (Signed)
 LCSW called patient at scheduled time for visit. LCSW reviewed reason for the referral. Patient explained that he is not seeking treatment at this time for his substance use. LCSW explained the available options for discussion and patient still declined. Patient has declined VBCI enrollment at this time. Patient is agreeable to LCSW to call him back on 12/26/2023 at 1:00Pm to follow up. LCSW provided patient with LCSW's contact information to reach out if a need arises before the scheduled visit.  [name], MSW, LCSW [place]  [facility], [facility] Licensed Clinical Social Worker Direct Dial: [phone]

## 2023-11-13 NOTE — Patient Instructions (Signed)
 Visit Information  Thank you for taking time to visit with me today. Please don't hesitate to contact me if I can be of assistance to you before our next scheduled appointment.  Our next appointment is by telephone on 12/26/2023 at 1:00 PM. Patient has declined enrollment into VBCI services at this time.  Please call the care guide team at [phone] if you need to cancel or reschedule your appointment.    Please call 911 if you are experiencing a Mental Health or Behavioral Health Crisis or need someone to talk to.  Patient verbalizes understanding of instructions and care plan provided today and agrees to view in MyChart. Active MyChart status and patient understanding of how to access instructions and care plan via MyChart confirmed with patient.     [name], MSW, LCSW [place]  [facility], [facility] Licensed Clinical Social Worker Direct Dial: [phone]

## 2023-11-23 ENCOUNTER — Ambulatory Visit (INDEPENDENT_AMBULATORY_CARE_PROVIDER_SITE_OTHER): Admitting: Physician Assistant

## 2023-11-23 ENCOUNTER — Encounter (INDEPENDENT_AMBULATORY_CARE_PROVIDER_SITE_OTHER): Payer: Self-pay | Admitting: Physician Assistant

## 2023-11-23 VITALS — BP 131/84 | HR 90

## 2023-11-23 DIAGNOSIS — H6122 Impacted cerumen, left ear: Secondary | ICD-10-CM | POA: Diagnosis not present

## 2023-11-23 DIAGNOSIS — H9192 Unspecified hearing loss, left ear: Secondary | ICD-10-CM

## 2023-11-23 MED ORDER — CIPROFLOXACIN-DEXAMETHASONE 0.3-0.1 % OT SUSP
4.0000 [drp] | Freq: Two times a day (BID) | OTIC | 0 refills | Status: AC
Start: 2023-11-23 — End: 2023-11-28

## 2023-11-23 NOTE — Progress Notes (Signed)
 Dear Dr. Joshua, Here is my assessment for our mutual patient, [name]. Thank you for allowing me the opportunity to care for your patient. Please do not hesitate to contact me should you have any other questions. Sincerely, Chyrl Cohen PA-C  Otolaryngology Clinic Note Referring provider: Dr. Joshua HPI:  [name] is a 58 y.o. male kindly referred by Dr. Joshua   The patient is a 58 year old gentleman seen in our office for follow-up evaluation of cerumen impaction.  The patient was last seen in the office on 11/09/2023, below is recap of that encounter.  The patient is a 58 year old gentleman seen in our office for evaluation of cerumen impaction.  Patient notes fullness in his ears particularly in the left ear.  He notes some blood on a Q-tip.  He saw his primary care and was diagnosed with cerumen impaction.  He was referred to our office for evaluation.  He notes decreased hearing out of the left ear, no decreased hearing out of the right ear.   - He had bilateral cerumen, the right was easily removed, the left had some persistent dried blood over along the external auditory canal that was very hard and unable to be removed  Update 11/23/2023.  The patient notes that since his last visit he has had decreased hearing out of the left ear, he notes he had some itching after.  He notes he has been using Q-tips in the ear.  He is also been using the Debrox that has been advised.     Independent Review of Additional Tests or Records:  None   PMH/Meds/All/SocHx/FamHx/ROS:   Past Medical History:  Diagnosis Date   Allergy    Arthritis    Compartment syndrome (HCC)    right arm    GERD (gastroesophageal reflux disease)    History of kidney stones    Hypertension    self reported     Past Surgical History:  Procedure Laterality Date   CARPAL TUNNEL RELEASE Right 2014   COLONOSCOPY     FASCIOTOMY Right    Forearm    Family History  Problem Relation Age of Onset    Cataracts Mother    Dementia Mother    Hypertension Father    Kidney disease Father    Glaucoma Maternal Grandmother    Hypertension Paternal Grandfather    Other Brother        dialysis   Other Brother        dialysis   Colon cancer Neg Hx    Esophageal cancer Neg Hx    Rectal cancer Neg Hx    Stomach cancer Neg Hx      Social Connections: Unknown (07/06/2022)   Social Connection and Isolation Panel    Frequency of Communication with Friends and Family: Three times a week    Frequency of Social Gatherings with Friends and Family: Twice a week    Attends Religious Services: Patient declined    Database administrator or Organizations: No    Attends Engineer, structural: Not on file    Marital Status: Widowed      Current Outpatient Medications:    ciprofloxacin -dexamethasone  (CIPRODEX ) OTIC suspension, Place 4 drops into the left ear 2 (two) times daily for 5 days., Disp: 2 mL, Rfl: 0   amLODipine  (NORVASC ) 5 MG tablet, Take 1 tablet (5 mg total) by mouth daily., Disp: 90 tablet, Rfl: 0   cyanocobalamin  2000 MCG tablet, Take 1 tablet (2,000 mcg total) by mouth  daily., Disp: 90 tablet, Rfl: 0   folic acid  (FOLVITE ) 1 MG tablet, Take 1 tablet (1 mg total) by mouth daily., Disp: 90 tablet, Rfl: 1   naltrexone  (DEPADE) 50 MG tablet, Take 1 tablet (50 mg total) by mouth daily., Disp: 90 tablet, Rfl: 0   rosuvastatin  (CRESTOR ) 10 MG tablet, Take 1 tablet (10 mg total) by mouth daily., Disp: 90 tablet, Rfl: 0   thiamine  (VITAMIN B1) 100 MG tablet, Take 1 tablet (100 mg total) by mouth daily., Disp: 90 tablet, Rfl: 0   Physical Exam:   BP 131/84   Pulse 90   SpO2 96%   Pertinent Findings  CN II-XII intact Right external auditory canal clear, TM intact with well-pneumatized middle ear space, left EAC with cerumen impaction  Weber 512: Localized right Rinne 512: AC > BC b/l  No respiratory distress or stridor  Seprately Identifiable Procedures:  Procedure: bilateral ear  microscopy and cerumen removal using microscope (CPT [phone]) - Mod 25 Pre-procedure diagnosis: unilateral cerumen impaction left external auditory canal Post-procedure diagnosis: same Indication: bilateral cerumen impaction; given patient's otologic complaints and history as well as for improved and comprehensive examination of external ear and tympanic membrane, bilateral otologic examination using microscope was performed and impacted cerumen removed  Procedure: Patient was placed semi-recumbent. Both ear canals were examined using the microscope with findings above. Cerumen removed from the left external auditory canal using suction and currette with improvement in EAC examination and patency. Left: EAC was patent. TM was intact . Middle ear was aerated. Drainage: none Right: EAC was patent. TM was intact . Middle ear was aerated . Drainage: none Patient tolerated the procedure well.   Impression & Plans:  [name] is a 58 y.o. male with the following   Cerumen impaction-  Patient seen today for follow-up evaluation of cerumen impaction.  The Debrox did help soften the dried blood.  I was able to remove the remainder of cerumen and blood from the left external auditory canal.  The patient had some friability of the tissue seen in the external auditory canal, he will be placed on Ciprodex  for 5 days for this.  He notes that after his last visit he was having some decreased hearing out of the left, I suspect this was secondary to the dissolving clot.  Although he did localize to the right today I have lower suspicion for acute sensorineural hearing loss.  I would recommend audiological evaluation, and like to see him back in the office in 2 weeks for repeat evaluation or sooner as needed.  The patient verbalized understanding and agreement to today's plan had no further questions or concerns.   - f/u 2 weeks in office, audiological evaluation next available    Thank you for allowing me  the opportunity to care for your patient. Please do not hesitate to contact me should you have any other questions.  Sincerely, Chyrl Cohen PA-C [facility] Phone: [phone] Fax: [phone]  11/23/2023, 3:19 PM

## 2023-11-28 ENCOUNTER — Other Ambulatory Visit (INDEPENDENT_AMBULATORY_CARE_PROVIDER_SITE_OTHER): Admitting: Pharmacist

## 2023-11-28 DIAGNOSIS — D539 Nutritional anemia, unspecified: Secondary | ICD-10-CM

## 2023-11-28 DIAGNOSIS — F1020 Alcohol dependence, uncomplicated: Secondary | ICD-10-CM

## 2023-11-28 NOTE — Progress Notes (Signed)
 11/28/2023 Name: [name] MRN: [id number] DOB: 01-Jun-1965  Chief Complaint  Patient presents with   Medication Management    [name] is a 58 y.o. year old male who presented for a telephone visit.   They were referred to the pharmacist by their PCP for assistance in managing anemia and alcohol use disorder.    Subjective:  Care Team: Primary Care Provider: Joshua Debby CROME, MD ; Next Scheduled Visit: none scheduled   Medication Access/Adherence  Current Pharmacy:  [id number] - [place] - [address] [place] [address] Phone: [phone] Fax: [phone]   Patient reports affordability concerns with their medications: No  Patient reports access/transportation concerns to their pharmacy: No  Patient reports adherence concerns with their medications:  Yes    Has run out of Vitamin B12 Stopped naltrexone  a couple days ago due to nausea   Anemia: Not currently taking Vitamin B12 2000 mcg daily (ran out of supply) Taking folic acid  1 mg daily  Vitamin deficiencies: Currently taking vitamin B1 daily  Alcohol use disorder: Pt was taking naltrexone  50 mg daily, stopped a couple days ago due to nausea   Objective:  Lab Results  Component Value Date   HGBA1C 5.5 08/02/2023    Lab Results  Component Value Date   CREATININE 0.63 11/02/2023   BUN 7 11/02/2023   NA 140 11/02/2023   K 3.9 11/02/2023   CL 98 11/02/2023   CO2 25 11/02/2023    Lab Results  Component Value Date   CHOL 244 (H) 08/02/2023   HDL 91.70 08/02/2023   LDLCALC 131 (H) 08/02/2023   TRIG 106.0 08/02/2023   CHOLHDL 3 08/02/2023    Medications Reviewed Today     Reviewed by Merceda Lela SAUNDERS, RPH (Pharmacist) on 11/28/23 at 1120  Med List Status: <None>   Medication Order Taking? Sig Documenting Provider Last Dose Status Informant  amLODipine  (NORVASC ) 5 MG tablet [id number] Yes Take 1 tablet (5 mg total) by mouth daily. [name] Debby CROME, MD  Active   ciprofloxacin -dexamethasone  (CIPRODEX ) OTIC suspension [id number] Yes Place 4 drops into the left ear 2 (two) times daily for 5 days. Palmer Reyes RIGGERS  Active   cyanocobalamin  2000 MCG tablet [id number]  Take 1 tablet (2,000 mcg total) by mouth daily.  Patient not taking: Reported on 11/28/2023   [name] Debby CROME, MD  Active   folic acid  (FOLVITE ) 1 MG tablet [id number] Yes Take 1 tablet (1 mg total) by mouth daily. [name] Debby CROME, MD  Active   naltrexone  (DEPADE) 50 MG tablet [id number]  Take 1 tablet (50 mg total) by mouth daily.  Patient not taking: Reported on 11/28/2023   [name] Debby CROME, MD  Active   rosuvastatin  (CRESTOR ) 10 MG tablet [id number] Yes Take 1 tablet (10 mg total) by mouth daily. [name] Debby CROME, MD  Active   thiamine  (VITAMIN B1) 100 MG tablet [id number] Yes Take 1 tablet (100 mg total) by mouth daily. [name] Debby CROME, MD  Active               Assessment/Plan:   Anemia: Currently uncontrolled, Hgb goal >13 Advised pt to get OTC vitamin B12 2000 mcg daily Recommend B12 level and CBC at 3 month PCP f/u  Vitamin B1 deficiency: Continue Vitamin B1  Alcohol use disorder: Scheduled PCP f/u for November for recommended 3 month follow up Encouraged reducing alcohol use Discussed dx of alcoholic hepatitis per PCP note  Follow Up Plan: PRN  Darrelyn Drum, PharmD, BCPS, CPP Clinical Pharmacist Practitioner [facility] [phone]

## 2023-11-28 NOTE — Patient Instructions (Signed)
 It was a pleasure speaking with you today!  Continue taking vitamin B1 and folic acid  daily.  Pick up vitamin B12 2000 mcg to take daily for anemia Follow up with Dr. Joshua for 3 month follow up. Scheduled for 02/08/24.  Feel free to call with any questions or concerns!  Darrelyn Drum, PharmD, BCPS, CPP Clinical Pharmacist Practitioner [facility] [phone]

## 2023-12-11 ENCOUNTER — Telehealth (INDEPENDENT_AMBULATORY_CARE_PROVIDER_SITE_OTHER): Payer: Self-pay | Admitting: Physician Assistant

## 2023-12-11 ENCOUNTER — Ambulatory Visit (INDEPENDENT_AMBULATORY_CARE_PROVIDER_SITE_OTHER): Admitting: Physician Assistant

## 2023-12-11 NOTE — Telephone Encounter (Signed)
 LVM for patient to call back to either r/s with Chyrl or see Dr. Soldatova today.

## 2023-12-26 ENCOUNTER — Encounter: Payer: Self-pay | Admitting: Emergency Medicine

## 2023-12-26 ENCOUNTER — Other Ambulatory Visit: Payer: Self-pay

## 2023-12-26 ENCOUNTER — Ambulatory Visit
Admission: EM | Admit: 2023-12-26 | Discharge: 2023-12-26 | Disposition: A | Discharge status: Home / Self Care | Attending: Emergency Medicine | Admitting: Emergency Medicine

## 2023-12-26 DIAGNOSIS — Z113 Encounter for screening for infections with a predominantly sexual mode of transmission: Secondary | ICD-10-CM | POA: Diagnosis not present

## 2023-12-26 NOTE — ED Provider Notes (Signed)
 [name]    CSN: [id number] Arrival date & time: 12/26/23  1315      History   Chief Complaint Chief Complaint  Patient presents with   Exposure to STD    HPI [name] is a 58 y.o. male.  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   Hearing loss secondary to cerumen impaction, left 11/02/2023   Deficiency anemia 11/02/2023   Alcohol use disorder, moderate, dependence (HCC) 11/02/2023   PSA elevation 11/02/2023   Alcoholic hepatitis without ascites 11/02/2023   Dietary folate deficiency anemia 11/02/2023   Vitamin B12 deficiency (dietary) anemia 11/02/2023   Non-recurrent bilateral inguinal hernia without obstruction or gangrene 07/07/2022   Encounter for general adult medical examination with abnormal findings 12/28/2020   Loud snoring 12/28/2020   Idiopathic chronic gout of multiple sites without tophus 12/28/2020   Elevated ferritin 02/17/2020   Essential hypertension 10/02/2019   Dyslipidemia, goal LDL below 100 10/02/2019   Hypersomnia with sleep apnea 06/09/2016   Excessive daytime sleepiness 06/09/2016   Parasomnia, 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    Past Surgical History:  Procedure Laterality Date   CARPAL TUNNEL RELEASE Right 2014   COLONOSCOPY     FASCIOTOMY Right    Forearm       Home Medications    Prior to Admission medications   Medication Sig Start Date End  Date Taking? Authorizing Provider  amLODipine  (NORVASC ) 5 MG tablet Take 1 tablet (5 mg total) by mouth daily. 11/02/23   Joshua Debby CROME, MD  folic acid  (FOLVITE ) 1 MG tablet Take 1 tablet (1 mg total) by mouth daily. 11/02/23   Joshua Debby CROME, MD  rosuvastatin  (CRESTOR ) 10 MG tablet Take 1 tablet (10 mg total) by mouth daily. 11/02/23   Joshua Debby CROME, MD  thiamine  (VITAMIN B1) 100 MG tablet Take 1 tablet (100 mg total) by mouth daily. 11/02/23   Joshua Debby CROME, MD    Family History Family History  Problem Relation Age of Onset   Cataracts Mother    Dementia Mother    Hypertension Father    Kidney disease Father    Glaucoma Maternal Grandmother    Hypertension Paternal Grandfather    Other Brother        dialysis   Other Brother        dialysis   Colon cancer Neg Hx    Esophageal cancer Neg Hx    Rectal cancer Neg Hx    Stomach cancer Neg Hx     Social History Social History   Tobacco Use   Smoking status: Former    Types: Cigars    Quit date: 06/09/2012    Years since quitting: 11.5    Passive exposure: Past   Smokeless tobacco: Never   Tobacco comments:    occasional cigar  Vaping Use   Vaping status: Never Used  Substance Use Topics   Alcohol use: Yes    Alcohol/week: 10.0 standard drinks of alcohol    Types: 10 Cans of beer per week    Comment: occasional   Drug use: No     Allergies   Sildenafil  and Doxycycline   Review of Systems Review of Systems As per HPI  Physical Exam Triage Vital Signs ED Triage Vitals  Encounter Vitals Group     BP 12/26/23 1353 (!) 148/91     Girls Systolic BP Percentile --      Girls Diastolic BP Percentile --      Boys Systolic BP Percentile --      Boys Diastolic BP Percentile --      Pulse Rate 12/26/23 1353 92     Resp 12/26/23 1353 18     Temp 12/26/23 1353 98.3 F (36.8 C)     Temp Source 12/26/23 1353 Oral     SpO2 12/26/23 1353 98 %     Weight 12/26/23 1352 185 lb 13.6 oz (84.3 kg)     Height --      Head  Circumference --      Peak Flow --      Pain Score 12/26/23 1352 0     Pain Loc --      Pain Education --      Exclude from Growth Chart --    No data found.  Updated Vital Signs BP (!) 148/91 (BP Location: Right Arm)   Pulse 92   Temp 98.3 F (36.8 C) (Oral)   Resp 18   Wt 185 lb 13.6 oz (84.3 kg)   SpO2 98%   BMI 29.11 kg/m   Physical Exam Vitals and nursing note reviewed.  Constitutional:      General: He is not in acute distress.    Appearance: Normal appearance.  HENT:     Mouth/Throat:     Mouth: Mucous membranes are moist.     Pharynx: Oropharynx is clear.  Cardiovascular:     Rate and Rhythm: Normal rate and regular rhythm.     Pulses: Normal pulses.     Heart sounds: Normal heart sounds.  Pulmonary:     Effort: Pulmonary effort is normal.     Breath sounds: Normal breath sounds.  Abdominal:     Palpations: Abdomen is soft.  Neurological:     Mental Status: He is alert and oriented to person, place, and time.     UC Treatments / Results  Labs (all labs ordered are listed, but only abnormal results are displayed) Labs Reviewed  CYTOLOGY, (ORAL, ANAL, URETHRAL) ANCILLARY ONLY    EKG   Radiology No results found.  Procedures Procedures (including critical care time)  Medications Ordered in UC Medications - No data to display  Initial Impression / Assessment and Plan / UC Course  I have reviewed the triage vital signs and the nursing notes.  Pertinent labs & imaging results that were available during my care of the patient were reviewed by me and considered in my medical decision making (see chart for details).  Cytology swab pending Treat positive result as indicated Set patient up with mychart account for result viewing  No questions at this time  Final Clinical Impressions(s) / UC Diagnoses   Final diagnoses:  Screen for STD (sexually transmitted disease)     Discharge Instructions      We will call you if anything on your swab  returns positive. You can also see these results  on MyChart. Please abstain from sexual intercourse until your results return.     ED Prescriptions   None    PDMP not reviewed this encounter.   Jeryl Stabs, [place] 12/26/23 [id number]

## 2023-12-26 NOTE — Patient Outreach (Signed)
 LCSW called patient as a follow up from call on 11/13/2023 in regards for treatment for substance use. Patient reports that he is doing well and is not seeking treatment. Patient will not be enrolled in VBCI services at this time. LCSW informed patient that LCSW's contact information will be in after visit summary if a need arises.  [name], MSW, LCSW [place]  [facility], [facility] Licensed Clinical Social Worker Direct Dial: [phone]

## 2023-12-26 NOTE — ED Triage Notes (Addendum)
 Pt presents requesting STD testing. Pt reports a lady he was talking to told him she had an infection that had to be cured with 5 day antibiotic so he is wanting to be checked for STD's. Pt denies having any sxs.

## 2023-12-26 NOTE — Discharge Instructions (Signed)
 We will call you if anything on your swab returns positive. You can also see these results on MyChart. Please abstain from sexual intercourse until your results return.

## 2023-12-26 NOTE — Patient Instructions (Signed)
  Visit Information  [name] was given information about Medicaid Managed Care team care coordination services as a part of their [employer] Medicaid benefit.   If you would like to schedule transportation through your [facility] plan, please call the following number at least 2 days in advance of your appointment: [phone]  If you are experiencing a behavioral health crisis, call the [employer] [place]  Behavioral Health Crisis Line at [phone]). The line is available 24 hours a day, seven days a week.   Patient verbalizes understanding of instructions and care plan provided today and agrees to view in MyChart. Active MyChart status and patient understanding of how to access instructions and care plan via MyChart confirmed with patient.     No further follow up required: Patient has declined services at this time.  [name], MSW, LCSW [place]  [facility], [facility] Licensed Clinical Social Worker Direct Dial: [phone]   Following is a copy of your plan of care:  There are no care plans that you recently modified to display for this patient.

## 2023-12-27 LAB — CYTOLOGY, (ORAL, ANAL, URETHRAL) ANCILLARY ONLY
Chlamydia: NEGATIVE
Comment: NEGATIVE
Comment: NEGATIVE
Comment: NORMAL
Neisseria Gonorrhea: NEGATIVE
Trichomonas: NEGATIVE

## 2024-01-01 ENCOUNTER — Ambulatory Visit: Attending: Physician Assistant | Admitting: Audiologist

## 2024-01-02 ENCOUNTER — Ambulatory Visit: Admitting: Audiologist

## 2024-02-05 ENCOUNTER — Encounter: Payer: Self-pay | Admitting: Emergency Medicine

## 2024-02-05 ENCOUNTER — Ambulatory Visit: Admission: EM | Admit: 2024-02-05 | Discharge: 2024-02-05 | Disposition: A | Discharge status: Home / Self Care

## 2024-02-05 DIAGNOSIS — M109 Gout, unspecified: Secondary | ICD-10-CM | POA: Diagnosis not present

## 2024-02-05 MED ORDER — PREDNISONE 50 MG PO TABS: ORAL_TABLET | ORAL | 0 refills | Status: DC

## 2024-02-05 NOTE — ED Triage Notes (Signed)
 Pt presents c/o gout flare up x 1 day. Pt states,  I need some prednisone  for this gout in my right foot. I can feel it in both but the right is the worse.

## 2024-02-05 NOTE — ED Provider Notes (Signed)
 [name]    CSN: [id number] Arrival date & time: 02/05/24  0810      History   Chief Complaint Chief Complaint  Patient presents with   Medication Refill   Gout    HPI [name] is a 58 y.o. male.   Patient presents today due to 8 out of 10 left ankle pain for 1 day.  Patient has a history of gout.  Patient states that he ate shrimp and drink.  Yesterday.  Patient states he woke up this morning with significant pain.  Patient is able to bear weight on the right lower extremity but gait is antalgic.  Patient states that he usually gets steroids for gout and would like a refill on those today.  The history is provided by the patient.  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   Hearing loss secondary to cerumen impaction, left 11/02/2023   Deficiency anemia 11/02/2023   Alcohol use disorder, moderate, dependence (HCC) 11/02/2023   PSA elevation 11/02/2023   Alcoholic hepatitis without ascites (HCC) 11/02/2023   Dietary folate deficiency anemia 11/02/2023   Vitamin B12 deficiency (dietary) anemia 11/02/2023   Non-recurrent bilateral inguinal hernia without obstruction or gangrene 07/07/2022   Encounter for general adult medical examination with abnormal findings 12/28/2020   Loud snoring 12/28/2020   Idiopathic chronic gout of multiple sites without tophus 12/28/2020   Elevated ferritin 02/17/2020   Essential hypertension 10/02/2019   Dyslipidemia, goal LDL below 100 10/02/2019   Hypersomnia with sleep apnea 06/09/2016   Excessive daytime sleepiness 06/09/2016   Parasomnia, 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   Nightmares REM-sleep type 06/09/2016   Erectile dysfunction  due to diseases classified elsewhere 04/29/2016   Overweight (BMI 25.0-29.9) 11/17/2015    Past Surgical History:  Procedure Laterality Date   CARPAL TUNNEL RELEASE Right 2014   COLONOSCOPY     FASCIOTOMY Right    Forearm       Home Medications    Prior to Admission medications   Medication Sig Start Date End Date Taking? Authorizing Provider  acetaminophen  (TYLENOL ) 325 MG tablet Take 650 mg by mouth every 6 (six) hours as needed.   Yes [provider]  predniSONE  (DELTASONE ) 50 MG tablet Take 1 tab daily by mouth for 5 days. 02/05/24  Yes Andra Krabbe C, PA-C  amLODipine  (NORVASC ) 5 MG tablet Take 1 tablet (5 mg total) by mouth daily. 11/02/23   Joshua Debby CROME, MD  folic acid  (FOLVITE ) 1 MG tablet Take 1 tablet (1 mg total) by mouth daily. 11/02/23   Joshua Debby CROME, MD  rosuvastatin  (CRESTOR ) 10 MG tablet Take 1 tablet (10 mg total) by mouth daily. 11/02/23   Joshua Debby CROME, MD  thiamine  (VITAMIN B1) 100 MG tablet Take 1 tablet (100 mg total) by mouth daily. 11/02/23   Joshua Debby CROME, MD    Family History Family History  Problem Relation Age of Onset   Cataracts Mother    Dementia Mother    Hypertension Father    Kidney disease Father    Glaucoma Maternal Grandmother    Hypertension Paternal Grandfather    Other Brother  dialysis   Other Brother        dialysis   Colon cancer Neg Hx    Esophageal cancer Neg Hx    Rectal cancer Neg Hx    Stomach cancer Neg Hx     Social History Social History   Tobacco Use   Smoking status: Former    Types: Cigars    Quit date: 06/09/2012    Years since quitting: 11.6    Passive exposure: Past   Smokeless tobacco: Never   Tobacco comments:    occasional cigar  Vaping Use   Vaping status: Never Used  Substance Use Topics   Alcohol use: Yes    Alcohol/week: 10.0 standard drinks of alcohol    Types: 10 Cans of beer per week    Comment: occasional   Drug use: No     Allergies   Sildenafil  and  Doxycycline   Review of Systems Review of Systems   Physical Exam Triage Vital Signs ED Triage Vitals  Encounter Vitals Group     BP 02/05/24 0859 135/88     Girls Systolic BP Percentile --      Girls Diastolic BP Percentile --      Boys Systolic BP Percentile --      Boys Diastolic BP Percentile --      Pulse Rate 02/05/24 0859 85     Resp 02/05/24 0859 18     Temp 02/05/24 0859 98.3 F (36.8 C)     Temp Source 02/05/24 0859 Oral     SpO2 02/05/24 0859 98 %     Weight 02/05/24 0857 185 lb (83.9 kg)     Height --      Head Circumference --      Peak Flow --      Pain Score 02/05/24 0856 8     Pain Loc --      Pain Education --      Exclude from Growth Chart --    No data found.  Updated Vital Signs BP 135/88 (BP Location: Left Arm)   Pulse 85   Temp 98.3 F (36.8 C) (Oral)   Resp 18   Wt 185 lb (83.9 kg)   SpO2 98%   BMI 28.98 kg/m   Visual Acuity Right Eye Distance:   Left Eye Distance:   Bilateral Distance:    Right Eye Near:   Left Eye Near:    Bilateral Near:     Physical Exam Vitals and nursing note reviewed.  Constitutional:      General: He is not in acute distress.    Appearance: Normal appearance. He is not ill-appearing, toxic-appearing or diaphoretic.  Eyes:     General: No scleral icterus. Cardiovascular:     Rate and Rhythm: Normal rate and regular rhythm.     Heart sounds: Normal heart sounds.  Pulmonary:     Effort: Pulmonary effort is normal. No respiratory distress.     Breath sounds: Normal breath sounds. No wheezing or rhonchi.  Musculoskeletal:     Right ankle: No swelling, deformity, ecchymosis or lacerations. Tenderness present over the lateral malleolus and medial malleolus. Normal range of motion.     Comments: Tenderness to palpation of right ankle, full range of motion of right ankle, pain elicited with active range of motion  Skin:    General: Skin is warm.  Neurological:     Mental Status: He is alert and oriented to  person, place, and time.  Psychiatric:  Mood and Affect: Mood normal.        Behavior: Behavior normal.      UC Treatments / Results  Labs (all labs ordered are listed, but only abnormal results are displayed) Labs Reviewed - No data to display  EKG   Radiology No results found.  Procedures Procedures (including critical care time)  Medications Ordered in UC Medications - No data to display  Initial Impression / Assessment and Plan / UC Course  I have reviewed the triage vital signs and the nursing notes.  Pertinent labs & imaging results that were available during my care of the patient were reviewed by me and considered in my medical decision making (see chart for details).     Final Clinical Impressions(s) / UC Diagnoses   Final diagnoses:  Gouty arthritis of right ankle   Discharge Instructions   None    ED Prescriptions     Medication Sig Dispense Auth. Provider   predniSONE  (DELTASONE ) 50 MG tablet Take 1 tab daily by mouth for 5 days. 5 tablet Andra Corean BROCKS, PA-C      PDMP not reviewed this encounter.   Andra Corean BROCKS, PA-C 02/05/24 [phone]

## 2024-02-08 ENCOUNTER — Ambulatory Visit: Admitting: Internal Medicine

## 2024-03-15 ENCOUNTER — Ambulatory Visit
Admission: EM | Admit: 2024-03-15 | Discharge: 2024-03-15 | Disposition: A | Discharge status: Home / Self Care | Attending: Emergency Medicine | Admitting: Emergency Medicine

## 2024-03-15 DIAGNOSIS — J0100 Acute maxillary sinusitis, unspecified: Secondary | ICD-10-CM

## 2024-03-15 MED ORDER — AMOXICILLIN-POT CLAVULANATE 875-125 MG PO TABS: 1.0000 | ORAL_TABLET | Freq: Two times a day (BID) | ORAL | 0 refills | Status: DC

## 2024-03-15 NOTE — Discharge Instructions (Signed)
 You were seen at the urgent care today for concerns of facial pain.  I suspect you likely have sinusitis but it is difficult to tell that this is viral versus bacterial.  I would recommend holding off starting your antibiotic therapy until 3 days from now if your symptoms are not improving or worsening.  For any concerns of significant worsening symptoms, please seek further evaluation with your primary care provider or go to the nearest emergency department.  You should no significant proving if your symptoms are related to a bacterial cause of this infection.  Follow-up closely with your primary care provider.

## 2024-03-15 NOTE — ED Triage Notes (Signed)
 Pt reports 2 days of sinus pressure, headache, cough, left eye red and watering. Denies fever. No meds taken. Right ankle hurting as well- reports hx of gout.

## 2024-03-15 NOTE — ED Provider Notes (Signed)
 " [name]    CSN: [id number] Arrival date & time: 03/15/24  1459      History   Chief Complaint Chief Complaint  Patient presents with   Facial Pain    HPI [name] is a 58 y.o. male.  Patient presents to the urgent care today with concerns of facial pain.  Reports left-sided facial pain for approximately 3 to 4 days.  He reports left eye redness and watering but denies any fever chills body aches.  Has been taking over-the-counter medication without significant proved in symptoms.  Reports he did have some discolored mucus from the left nostril as well as some blood in the nostril earlier today.  Also endorses some left ear discomfort but denies any drainage coming from this area.  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   Hearing loss secondary to cerumen impaction, left 11/02/2023   Deficiency anemia 11/02/2023   Alcohol use disorder, moderate, dependence (HCC) 11/02/2023   PSA elevation 11/02/2023   Alcoholic hepatitis without ascites (HCC) 11/02/2023   Dietary folate deficiency anemia 11/02/2023   Vitamin B12 deficiency (dietary) anemia 11/02/2023   Non-recurrent bilateral inguinal hernia without obstruction or gangrene 07/07/2022   Encounter for general adult medical examination with abnormal findings 12/28/2020   Loud snoring 12/28/2020   Idiopathic chronic gout of multiple sites without tophus 12/28/2020   Elevated ferritin 02/17/2020   Essential hypertension 10/02/2019   Dyslipidemia, goal LDL below 100 10/02/2019   Hypersomnia with sleep apnea 06/09/2016   Excessive daytime sleepiness 06/09/2016   Parasomnia, 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   Nightmares REM-sleep type  06/09/2016   Erectile dysfunction due to diseases classified elsewhere 04/29/2016   Overweight (BMI 25.0-29.9) 11/17/2015    Past Surgical History:  Procedure Laterality Date   CARPAL TUNNEL RELEASE Right 2014   COLONOSCOPY     FASCIOTOMY Right    Forearm       Home Medications    Prior to Admission medications  Medication Sig Start Date End Date Taking? Authorizing Provider  acetaminophen  (TYLENOL ) 325 MG tablet Take 650 mg by mouth every 6 (six) hours as needed.   Yes [provider]  amLODipine  (NORVASC ) 5 MG tablet Take 1 tablet (5 mg total) by mouth daily. 11/02/23  Yes Joshua Debby CROME, MD  amoxicillin -clavulanate (AUGMENTIN ) 875-125 MG tablet Take 1 tablet by mouth every 12 (twelve) hours. 03/15/24  Yes Finnlee Silvernail A, PA-C  folic acid  (FOLVITE ) 1 MG tablet Take 1 tablet (1 mg total) by mouth daily. 11/02/23  Yes Joshua Debby CROME, MD  rosuvastatin  (CRESTOR ) 10 MG tablet Take 1 tablet (10 mg total) by mouth daily. 11/02/23  Yes Joshua Debby CROME, MD  thiamine  (VITAMIN B1) 100 MG tablet Take 1 tablet (100 mg total) by mouth daily. 11/02/23  Yes Joshua Debby CROME, MD  predniSONE  (DELTASONE ) 50 MG tablet Take 1 tab daily by mouth for 5 days. Patient not taking: Reported on 03/15/2024 02/05/24   Andra Corean BROCKS, PA-C    Family History Family History  Problem Relation Age of Onset   Cataracts Mother    Dementia Mother    Hypertension Father  Kidney disease Father    Glaucoma Maternal Grandmother    Hypertension Paternal Grandfather    Other Brother        dialysis   Other Brother        dialysis   Colon cancer Neg Hx    Esophageal cancer Neg Hx    Rectal cancer Neg Hx    Stomach cancer Neg Hx     Social History Social History[1]   Allergies   Sildenafil  and Doxycycline   Review of Systems Review of Systems  HENT:  Positive for congestion.   All other systems reviewed and are negative.    Physical Exam Triage Vital Signs ED Triage Vitals   Encounter Vitals Group     BP 03/15/24 1631 124/87     Girls Systolic BP Percentile --      Girls Diastolic BP Percentile --      Boys Systolic BP Percentile --      Boys Diastolic BP Percentile --      Pulse Rate 03/15/24 1631 98     Resp 03/15/24 1631 16     Temp 03/15/24 1631 98.7 F (37.1 C)     Temp Source 03/15/24 1631 Oral     SpO2 03/15/24 1631 97 %     Weight --      Height --      Head Circumference --      Peak Flow --      Pain Score 03/15/24 1629 7     Pain Loc --      Pain Education --      Exclude from Growth Chart --    No data found.  Updated Vital Signs BP 124/87 (BP Location: Left Arm)   Pulse 98   Temp 98.7 F (37.1 C) (Oral)   Resp 16   SpO2 97%   Visual Acuity Right Eye Distance:   Left Eye Distance:   Bilateral Distance:    Right Eye Near:   Left Eye Near:    Bilateral Near:     Physical Exam Vitals and nursing note reviewed.  Constitutional:      General: He is not in acute distress.    Appearance: He is well-developed.  HENT:     Head: Normocephalic and atraumatic.      Comments: Tenderness to palpation of the left maxillary sinus. Eyes:     Conjunctiva/sclera: Conjunctivae normal.  Cardiovascular:     Rate and Rhythm: Normal rate and regular rhythm.     Heart sounds: No murmur heard. Pulmonary:     Effort: Pulmonary effort is normal. No respiratory distress.     Breath sounds: Normal breath sounds.  Abdominal:     Palpations: Abdomen is soft.     Tenderness: There is no abdominal tenderness.  Musculoskeletal:        General: No swelling.     Cervical back: Neck supple.  Skin:    General: Skin is warm and dry.     Capillary Refill: Capillary refill takes less than 2 seconds.  Neurological:     Mental Status: He is alert.  Psychiatric:        Mood and Affect: Mood normal.      UC Treatments / Results  Labs (all labs ordered are listed, but only abnormal results are displayed) Labs Reviewed - No data to  display  EKG   Radiology No results found.  Procedures Procedures (including critical care time)  Medications Ordered in UC Medications - No data  to display  Initial Impression / Assessment and Plan / UC Course  I have reviewed the triage vital signs and the nursing notes.  Pertinent labs & imaging results that were available during my care of the patient were reviewed by me and considered in my medical decision making (see chart for details).     This patient presents to the UC for concern of facial pain.  Differential diagnosis includes URI, gingivitis, sinus infection, congestion    Additional history obtained:  Additional history obtained from chart review   Problem List / UC Course:  Patient presents to urgent care today with concerns of facial pain.  Reports about 3 to 4 days of symptoms.  Reports pain is to the left face although not there in the left eye.  Denies any eye involvement but does report a watery eye.  No reported dental pain or discomfort.  Denies any ear pain, drainage, or fullness.  Has been taking over-the-counter medication without significant proved in symptoms.  Denies any cough, fever, sore throat, or bodyaches.  No sick contacts as far as he is aware. On exam, there is tenderness to the left maxillary sinus.  ENT evaluation is unremarkable with exception of a middle ear effusion to the left side. After careful discussion of symptoms with patient, advised that difficult to fully determine if this could be viral versus bacterial versus other cause of symptoms.  Given patient has been symptomatic for only 4 days, advised that bacterial source is difficult to fully determine and typically is not guaranteed until patients are typically 7 to 10 days symptomatic without significant improvement or progressive worsening in the symptoms.  Given lack of cough, congestion, fever, less likely that this is a clear viral process but difficult to fully determine.   Advised that a prescription for antibiotics will be sent to pharmacy but cautioned against using antibiotics until he is at least 1 week post initiation of symptoms to ensure treatment for likely bacterial source as opposed to antibiotic therapy for suspected viral cause of symptoms.  Patient is agreeable with this current plan and verbalized understanding.  Return precautions discussed.  Discharged home in stable condition.   Social Determinants of Health:  None  Final Clinical Impressions(s) / UC Diagnoses   Final diagnoses:  Acute non-recurrent maxillary sinusitis     Discharge Instructions      You were seen at the urgent care today for concerns of facial pain.  I suspect you likely have sinusitis but it is difficult to tell that this is viral versus bacterial.  I would recommend holding off starting your antibiotic therapy until 3 days from now if your symptoms are not improving or worsening.  For any concerns of significant worsening symptoms, please seek further evaluation with your primary care provider or go to the nearest emergency department.  You should no significant proving if your symptoms are related to a bacterial cause of this infection.  Follow-up closely with your primary care provider.     ED Prescriptions     Medication Sig Dispense Auth. Provider   amoxicillin -clavulanate (AUGMENTIN ) 875-125 MG tablet Take 1 tablet by mouth every 12 (twelve) hours. 14 tablet [name] A, PA-C      PDMP not reviewed this encounter.    [1]  Social History Tobacco Use   Smoking status: Former    Types: Cigars    Quit date: 06/09/2012    Years since quitting: 11.7    Passive exposure: Past   Smokeless  tobacco: Never   Tobacco comments:    occasional cigar  Vaping Use   Vaping status: Never Used  Substance Use Topics   Alcohol use: Yes    Alcohol/week: 10.0 standard drinks of alcohol    Types: 10 Cans of beer per week    Comment: occasional   Drug use: No      Ustin Cruickshank A, PA-C 03/15/24 1742  "

## 2024-04-18 ENCOUNTER — Encounter (HOSPITAL_COMMUNITY): Payer: Self-pay | Admitting: Emergency Medicine

## 2024-04-18 ENCOUNTER — Other Ambulatory Visit (HOSPITAL_COMMUNITY)
Admission: EM | Admit: 2024-04-18 | Discharge: 2024-04-23 | Disposition: A | Source: Intra-hospital | Attending: Psychiatry | Admitting: Psychiatry

## 2024-04-18 ENCOUNTER — Other Ambulatory Visit: Payer: Self-pay

## 2024-04-18 ENCOUNTER — Ambulatory Visit (HOSPITAL_COMMUNITY)
Admission: EM | Admit: 2024-04-18 | Discharge: 2024-04-18 | Disposition: A | Discharge status: Other Acute Inpatient Hospital | Attending: Psychiatry | Admitting: Psychiatry

## 2024-04-18 DIAGNOSIS — Z87891 Personal history of nicotine dependence: Secondary | ICD-10-CM | POA: Insufficient documentation

## 2024-04-18 DIAGNOSIS — F10188 Alcohol abuse with other alcohol-induced disorder: Secondary | ICD-10-CM | POA: Insufficient documentation

## 2024-04-18 DIAGNOSIS — F4321 Adjustment disorder with depressed mood: Secondary | ICD-10-CM | POA: Insufficient documentation

## 2024-04-18 DIAGNOSIS — E7800 Pure hypercholesterolemia, unspecified: Secondary | ICD-10-CM | POA: Insufficient documentation

## 2024-04-18 DIAGNOSIS — Z79899 Other long term (current) drug therapy: Secondary | ICD-10-CM | POA: Insufficient documentation

## 2024-04-18 DIAGNOSIS — F1020 Alcohol dependence, uncomplicated: Secondary | ICD-10-CM | POA: Diagnosis not present

## 2024-04-18 DIAGNOSIS — E559 Vitamin D deficiency, unspecified: Secondary | ICD-10-CM | POA: Insufficient documentation

## 2024-04-18 DIAGNOSIS — G4730 Sleep apnea, unspecified: Secondary | ICD-10-CM | POA: Insufficient documentation

## 2024-04-18 DIAGNOSIS — Z560 Unemployment, unspecified: Secondary | ICD-10-CM | POA: Insufficient documentation

## 2024-04-18 DIAGNOSIS — E538 Deficiency of other specified B group vitamins: Secondary | ICD-10-CM | POA: Insufficient documentation

## 2024-04-18 DIAGNOSIS — G629 Polyneuropathy, unspecified: Secondary | ICD-10-CM | POA: Insufficient documentation

## 2024-04-18 DIAGNOSIS — R251 Tremor, unspecified: Secondary | ICD-10-CM | POA: Insufficient documentation

## 2024-04-18 DIAGNOSIS — F1090 Alcohol use, unspecified, uncomplicated: Secondary | ICD-10-CM | POA: Diagnosis present

## 2024-04-18 DIAGNOSIS — I10 Essential (primary) hypertension: Secondary | ICD-10-CM | POA: Insufficient documentation

## 2024-04-18 LAB — CBC WITH DIFFERENTIAL/PLATELET
Abs Immature Granulocytes: 0.01 K/uL (ref 0.00–0.07)
Basophils Absolute: 0 K/uL (ref 0.0–0.1)
Basophils Relative: 0 %
Eosinophils Absolute: 0 K/uL (ref 0.0–0.5)
Eosinophils Relative: 1 %
HCT: 36.1 % — ABNORMAL LOW (ref 39.0–52.0)
Hemoglobin: 12 g/dL — ABNORMAL LOW (ref 13.0–17.0)
Immature Granulocytes: 0 %
Lymphocytes Relative: 15 %
Lymphs Abs: 0.6 K/uL — ABNORMAL LOW (ref 0.7–4.0)
MCH: 29.9 pg (ref 26.0–34.0)
MCHC: 33.2 g/dL (ref 30.0–36.0)
MCV: 90 fL (ref 80.0–100.0)
Monocytes Absolute: 0.5 K/uL (ref 0.1–1.0)
Monocytes Relative: 13 %
Neutro Abs: 2.7 K/uL (ref 1.7–7.7)
Neutrophils Relative %: 71 %
Platelets: 183 K/uL (ref 150–400)
RBC: 4.01 MIL/uL — ABNORMAL LOW (ref 4.22–5.81)
RDW: 16.2 % — ABNORMAL HIGH (ref 11.5–15.5)
WBC: 3.9 K/uL — ABNORMAL LOW (ref 4.0–10.5)
nRBC: 0 % (ref 0.0–0.2)

## 2024-04-18 LAB — LIPID PANEL
Cholesterol: 222 mg/dL — ABNORMAL HIGH (ref 0–200)
HDL: 88 mg/dL
LDL Cholesterol: 114 mg/dL — ABNORMAL HIGH (ref 0–99)
Total CHOL/HDL Ratio: 2.5 ratio
Triglycerides: 100 mg/dL
VLDL: 20 mg/dL (ref 0–40)

## 2024-04-18 LAB — COMPREHENSIVE METABOLIC PANEL WITH GFR
ALT: 55 U/L — ABNORMAL HIGH (ref 0–44)
AST: 110 U/L — ABNORMAL HIGH (ref 15–41)
Albumin: 4.5 g/dL (ref 3.5–5.0)
Alkaline Phosphatase: 89 U/L (ref 38–126)
Anion gap: 13 (ref 5–15)
BUN: 8 mg/dL (ref 6–20)
CO2: 27 mmol/L (ref 22–32)
Calcium: 9.6 mg/dL (ref 8.9–10.3)
Chloride: 96 mmol/L — ABNORMAL LOW (ref 98–111)
Creatinine, Ser: 0.69 mg/dL (ref 0.61–1.24)
GFR, Estimated: >60 mL/min
Glucose, Bld: 145 mg/dL — ABNORMAL HIGH (ref 70–99)
Potassium: 3.7 mmol/L (ref 3.5–5.1)
Sodium: 136 mmol/L (ref 135–145)
Total Bilirubin: 0.8 mg/dL (ref 0.0–1.2)
Total Protein: 7.4 g/dL (ref 6.5–8.1)

## 2024-04-18 LAB — HEMOGLOBIN A1C
Hgb A1c MFr Bld: 5.2 % (ref 4.8–5.6)
Mean Plasma Glucose: 102.54 mg/dL

## 2024-04-18 LAB — TSH: TSH: 1.19 u[IU]/mL (ref 0.350–4.500)

## 2024-04-18 LAB — ETHANOL: Alcohol, Ethyl (B): <15 mg/dL

## 2024-04-18 LAB — MAGNESIUM: Magnesium: 1.5 mg/dL — ABNORMAL LOW (ref 1.7–2.4)

## 2024-04-18 LAB — VITAMIN B12: Vitamin B-12: 722 pg/mL (ref 180–914)

## 2024-04-18 MED ORDER — DIPHENHYDRAMINE HCL 50 MG/ML IJ SOLN: 50.0000 mg | Freq: Three times a day (TID) | INTRAMUSCULAR | Status: DC | PRN

## 2024-04-18 MED ORDER — LORAZEPAM 2 MG/ML IJ SOLN: 2.0000 mg | Freq: Three times a day (TID) | INTRAMUSCULAR | Status: DC | PRN

## 2024-04-18 MED ORDER — HALOPERIDOL 5 MG PO TABS: 5.0000 mg | ORAL_TABLET | Freq: Three times a day (TID) | ORAL | Status: DC | PRN

## 2024-04-18 MED ORDER — TRAZODONE HCL 50 MG PO TABS
50.0000 mg | ORAL_TABLET | Freq: Every evening | ORAL | Status: DC | PRN
  Administered 2024-04-18 – 2024-04-22 (×5): 50 mg via ORAL
  Filled 2024-04-18 (×5): qty 1

## 2024-04-18 MED ORDER — LOPERAMIDE HCL 2 MG PO CAPS: 2.0000 mg | ORAL_CAPSULE | ORAL | Status: AC | PRN

## 2024-04-18 MED ORDER — HALOPERIDOL LACTATE 5 MG/ML IJ SOLN: 10.0000 mg | Freq: Three times a day (TID) | INTRAMUSCULAR | Status: DC | PRN

## 2024-04-18 MED ORDER — ADULT MULTIVITAMIN W/MINERALS CH
1.0000 | ORAL_TABLET | Freq: Every day | ORAL | Status: DC
  Administered 2024-04-18 – 2024-04-23 (×6): 1 via ORAL
  Filled 2024-04-18 (×6): qty 1

## 2024-04-18 MED ORDER — ACETAMINOPHEN 325 MG PO TABS: 650.0000 mg | ORAL_TABLET | Freq: Four times a day (QID) | ORAL | Status: DC | PRN

## 2024-04-18 MED ORDER — LORAZEPAM 1 MG PO TABS
1.0000 mg | ORAL_TABLET | Freq: Two times a day (BID) | ORAL | Status: AC
  Administered 2024-04-20 – 2024-04-21 (×2): 1 mg via ORAL
  Filled 2024-04-18 (×2): qty 1

## 2024-04-18 MED ORDER — LOPERAMIDE HCL 2 MG PO CAPS: 2.0000 mg | ORAL_CAPSULE | ORAL | Status: DC | PRN

## 2024-04-18 MED ORDER — LORAZEPAM 1 MG PO TABS: 1.0000 mg | ORAL_TABLET | Freq: Four times a day (QID) | ORAL | Status: DC | PRN

## 2024-04-18 MED ORDER — THIAMINE MONONITRATE 100 MG PO TABS: 100.0000 mg | ORAL_TABLET | Freq: Every day | ORAL | Status: DC

## 2024-04-18 MED ORDER — ALUM & MAG HYDROXIDE-SIMETH 200-200-20 MG/5ML PO SUSP: 30.0000 mL | ORAL | Status: DC | PRN

## 2024-04-18 MED ORDER — HALOPERIDOL LACTATE 5 MG/ML IJ SOLN: 5.0000 mg | Freq: Three times a day (TID) | INTRAMUSCULAR | Status: DC | PRN

## 2024-04-18 MED ORDER — ONDANSETRON 4 MG PO TBDP: 4.0000 mg | ORAL_TABLET | Freq: Four times a day (QID) | ORAL | Status: DC | PRN

## 2024-04-18 MED ORDER — AMLODIPINE BESYLATE 5 MG PO TABS
5.0000 mg | ORAL_TABLET | Freq: Every day | ORAL | Status: DC
  Administered 2024-04-18 – 2024-04-23 (×6): 5 mg via ORAL
  Filled 2024-04-18 (×6): qty 1

## 2024-04-18 MED ORDER — LORAZEPAM 1 MG PO TABS
1.0000 mg | ORAL_TABLET | Freq: Three times a day (TID) | ORAL | Status: AC
  Administered 2024-04-19 – 2024-04-20 (×3): 1 mg via ORAL
  Filled 2024-04-18 (×3): qty 1

## 2024-04-18 MED ORDER — ADULT MULTIVITAMIN W/MINERALS CH: 1.0000 | ORAL_TABLET | Freq: Every day | ORAL | Status: DC

## 2024-04-18 MED ORDER — FOLIC ACID 1 MG PO TABS: 1.0000 mg | ORAL_TABLET | Freq: Every day | ORAL | Status: DC

## 2024-04-18 MED ORDER — ROSUVASTATIN CALCIUM 5 MG PO TABS: 10.0000 mg | ORAL_TABLET | Freq: Every day | ORAL | Status: DC

## 2024-04-18 MED ORDER — MAGNESIUM OXIDE -MG SUPPLEMENT 400 (240 MG) MG PO TABS
400.0000 mg | ORAL_TABLET | Freq: Every day | ORAL | Status: DC
  Administered 2024-04-18 – 2024-04-23 (×6): 400 mg via ORAL
  Filled 2024-04-18 (×6): qty 1

## 2024-04-18 MED ORDER — THIAMINE MONONITRATE 100 MG PO TABS
100.0000 mg | ORAL_TABLET | Freq: Every day | ORAL | Status: DC
  Administered 2024-04-19 – 2024-04-23 (×5): 100 mg via ORAL
  Filled 2024-04-18 (×5): qty 1

## 2024-04-18 MED ORDER — LORAZEPAM 1 MG PO TABS
1.0000 mg | ORAL_TABLET | Freq: Four times a day (QID) | ORAL | Status: AC
  Administered 2024-04-18 – 2024-04-19 (×4): 1 mg via ORAL
  Filled 2024-04-18 (×4): qty 1

## 2024-04-18 MED ORDER — FOLIC ACID 1 MG PO TABS
1.0000 mg | ORAL_TABLET | Freq: Every day | ORAL | Status: DC
  Administered 2024-04-18 – 2024-04-23 (×6): 1 mg via ORAL
  Filled 2024-04-18 (×6): qty 1

## 2024-04-18 MED ORDER — MAGNESIUM HYDROXIDE 400 MG/5ML PO SUSP
30.0000 mL | Freq: Every day | ORAL | Status: DC | PRN
  Administered 2024-04-21: 30 mL via ORAL
  Filled 2024-04-18: qty 30

## 2024-04-18 MED ORDER — THIAMINE HCL 100 MG/ML IJ SOLN: 100.0000 mg | Freq: Once | INTRAMUSCULAR | Status: DC

## 2024-04-18 MED ORDER — ONDANSETRON 4 MG PO TBDP
4.0000 mg | ORAL_TABLET | Freq: Four times a day (QID) | ORAL | Status: AC | PRN
  Filled 2024-04-18: qty 1

## 2024-04-18 MED ORDER — HYDROXYZINE HCL 25 MG PO TABS
25.0000 mg | ORAL_TABLET | Freq: Three times a day (TID) | ORAL | Status: DC | PRN
  Administered 2024-04-20: 25 mg via ORAL
  Filled 2024-04-18: qty 1

## 2024-04-18 MED ORDER — ROSUVASTATIN CALCIUM 5 MG PO TABS
10.0000 mg | ORAL_TABLET | Freq: Every day | ORAL | Status: DC
  Administered 2024-04-19 – 2024-04-23 (×5): 10 mg via ORAL
  Filled 2024-04-18 (×5): qty 2

## 2024-04-18 MED ORDER — HYDROXYZINE HCL 25 MG PO TABS: 25.0000 mg | ORAL_TABLET | Freq: Three times a day (TID) | ORAL | Status: DC | PRN

## 2024-04-18 MED ORDER — MAGNESIUM HYDROXIDE 400 MG/5ML PO SUSP: 30.0000 mL | Freq: Every day | ORAL | Status: DC | PRN

## 2024-04-18 MED ORDER — AMLODIPINE BESYLATE 5 MG PO TABS: 5.0000 mg | ORAL_TABLET | Freq: Every day | ORAL | Status: DC

## 2024-04-18 MED ORDER — TRAZODONE HCL 50 MG PO TABS: 50.0000 mg | ORAL_TABLET | Freq: Every evening | ORAL | Status: DC | PRN

## 2024-04-18 MED ORDER — DIPHENHYDRAMINE HCL 50 MG PO CAPS: 50.0000 mg | ORAL_CAPSULE | Freq: Three times a day (TID) | ORAL | Status: DC | PRN

## 2024-04-18 MED ORDER — IBUPROFEN 200 MG PO TABS
200.0000 mg | ORAL_TABLET | Freq: Every day | ORAL | Status: DC | PRN
  Administered 2024-04-18: 200 mg via ORAL
  Filled 2024-04-18: qty 1

## 2024-04-18 MED ORDER — LORAZEPAM 1 MG PO TABS
1.0000 mg | ORAL_TABLET | Freq: Every day | ORAL | Status: AC
  Administered 2024-04-22: 1 mg via ORAL
  Filled 2024-04-18: qty 1

## 2024-04-18 MED ORDER — LORAZEPAM 1 MG PO TABS: 1.0000 mg | ORAL_TABLET | Freq: Four times a day (QID) | ORAL | Status: AC | PRN

## 2024-04-18 NOTE — Group Note (Signed)
 Group Topic: Understanding Self  Group Date: 04/18/2024 Start Time: 1200 End Time: 1230 Facilitators: Daved Tinnie HERO, RN  Department: [facility]  Number of Participants: 4  Group Focus: self-awareness Treatment Modality:  Psychoeducation Interventions utilized were patient education Purpose: increase insight  Name: [name] Date of Birth: 09-04-1965  MR: [id number]    Level of Participation: pt was not admitted to unit at time of RN group  Plan: patient will be encouraged to attend future RN groups  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   Hearing loss secondary to cerumen impaction, left 11/02/2023   Deficiency anemia 11/02/2023   Alcohol use disorder, moderate, dependence (HCC) 11/02/2023   PSA elevation 11/02/2023   Alcoholic hepatitis without ascites (HCC) 11/02/2023   Dietary folate deficiency anemia 11/02/2023   Vitamin B12 deficiency (dietary) anemia 11/02/2023   Non-recurrent bilateral inguinal hernia without obstruction or gangrene 07/07/2022   Encounter for general adult medical examination with abnormal findings 12/28/2020   Loud snoring 12/28/2020   Idiopathic chronic gout of multiple sites without tophus 12/28/2020   Elevated ferritin 02/17/2020   Essential hypertension 10/02/2019   Dyslipidemia, goal LDL below 100 10/02/2019   Hypersomnia with sleep apnea 06/09/2016   Excessive daytime sleepiness 06/09/2016   Parasomnia, 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

## 2024-04-18 NOTE — ED Notes (Signed)
 Pt oriented to unit, further questions denied. Pt presents as pleasant with visible tremor. Pt denies anxiety, si hi and avh, verbal contract for safety provided. Pt has visible abrasion behind L ear. Pt placed on fall risk for report of 'legs feeling like rubber' and possible hx dx of neuropathy in feet.

## 2024-04-18 NOTE — Care Management (Signed)
 [facility] Care Management...  Writer met with the client to discuss discharge planning.  Writer informed the client the average stay at [facility] is 3-5 days.  Client reports he's unsure of the services he would like upon discharge.  Client has declined residential treatment but reports he may consider outpatient therapy.  Client reports on average he consumes an pint of alcohol per day.  Client reports his intake increased after he was wrongfully terminated from his job after sustaining an injury at his job.  Client reports he tried to detox on his own but due to th withdrawal symptoms he decided to come to [facility].   Client has observable shakes/jitters when meeting with the client.  Client was pleasant and reports no SI/HI and or plans.  Client reports no AVH.  Writer will continue to discuss discharge options with the client tomorrow after he has more time to settle in and talk with providers.

## 2024-04-18 NOTE — BH Assessment (Addendum)
 Comprehensive Clinical Assessment (CCA) Note  04/18/2024 [name] [id number]  DISPOSITION: Per Alan Mcardle NP pt is recommended for [facility] admission.   The patient demonstrates the following risk factors for suicide: Chronic risk factors for suicide include: substance use disorder. Acute risk factors for suicide include: N/A. Protective factors for this patient include: hope for the future. Considering these factors, the overall suicide risk at this point appears to be low. Patient is appropriate for outpatient follow up.   Per Triage assessment: [name] 58y male presents to [facility] accompanied by a mobile health crisis counselor who has now left the building. PT wants help to stop drinking. PT states that he drinks everyday, maybe a pint. Pt is wanting to detox. PT denies SI, HI, AVH and substance use.   With further assessment: Pt is a 59 yo male who presented voluntarily and unaccompanied requesting detoc from alcohol. Pt stated that his last use was last night when he had 2 shots of liquor. Pt stated that he drinks 1/2 to 1 pint daily. Pt reported no withdrawal symptoms currently.  Pt stated that he has 2 adult children, no current pending legal issues and denied access to firearms. Pt denied any childhood abuse.   Pt stated that he lives alone and his second wife died in 2020-05-15. Pt stated that he is currently unemployed because he was injury on his job and had to have surgery. Pt stated that he sleeps about 8 hours per night and has had sightly decreased appetite recently. Pt denied feeling hopeless and helpless.    Chief Complaint: substance use  Visit Diagnosis:  Alcohol Use d/o, Severe    CCA Screening, Triage and Referral (STR)  Patient Reported Information How did you hear about us ? No data recorded What Is the Reason for Your Visit/Call Today? [name] 58y male presents to [facility] accompanied by a mobile health crisis counselor who has now left the building.  PT wants help to stop drinking. PT states that he drinks everyday, maybe a pint. Pt is wanting to detox. PT denies SI, HI, AVH and substance use.  How Long Has This Been Causing You Problems? > than 6 months  What Do You Feel Would Help You the Most Today? Alcohol or Drug Use Treatment   Have You Recently Had Any Thoughts About Hurting Yourself? No  Are You Planning to Commit Suicide/Harm Yourself At This time? No   Flowsheet Row ED from 04/18/2024 in [facility] UC from 03/15/2024 in [facility] at [facility] UC from 02/05/2024 in [facility] Urgent Care at [facility])  C-SSRS RISK CATEGORY No Risk No Risk No Risk    Have you Recently Had Thoughts About Hurting Someone Sherral? No  Are You Planning to Harm Someone at This Time? No  Explanation: na  Have You Used Any Alcohol or Drugs in the Past 24 Hours? Yes  How Long Ago Did You Use Drugs or Alcohol? Last night, 2 shots of liquor What Did You Use and How Much? alcohol - shots of liquor   Do You Currently Have a Therapist/Psychiatrist? No  Name of Therapist/Psychiatrist:    Have You Been Recently Discharged From Any Office Practice or Programs? No  Explanation of Discharge From Practice/Program: na    CCA Screening Triage Referral Assessment Type of Contact: Face-to-Face  Telemedicine Service Delivery:   Is this Initial or Reassessment?   Date Telepsych consult ordered in [facility]:    Time Telepsych  consult ordered in [facility]:    Location of Assessment: [facility] Assessment Services  Provider Location: [facility] Assessment Services   Collateral Involvement: none   Does Patient Have a Automotive Engineer Guardian? No  Legal Guardian Contact Information: na  Copy of Legal Guardianship Form: -- (na)  Legal Guardian Notified of Arrival: -- (na)  Legal Guardian Notified of Pending Discharge: -- (na)  If Minor and Not Living with Parent(s), Who has  Custody? adult  Is CPS involved or ever been involved? Never (none reported)  Is APS involved or ever been involved? Never   Patient Determined To Be At Risk for Harm To Self or Others Based on Review of Patient Reported Information or Presenting Complaint? No  Method: No Plan  Availability of Means: No access or NA  Intent: Vague intent or NA  Notification Required: No need or identified person  Additional Information for Danger to Others Potential: -- (na)  Additional Comments for Danger to Others Potential: none  Are There Guns or Other Weapons in Your Home? No (pt denied)  Types of Guns/Weapons: na  Are These Weapons Safely Secured?                            -- (na)  Who Could Verify You Are Able To Have These Secured: na  Do You Have any Outstanding Charges, Pending Court Dates, Parole/Probation? pt denied  Contacted To Inform of Risk of Harm To Self or Others: -- (na)    Does Patient Present under Involuntary Commitment? No    [place] of Residence: [place]   Patient Currently Receiving the Following Services: Not Receiving Services   Determination of Need: Urgent (48 hours)   Options For Referral: Facility-Based Crisis     CCA Biopsychosocial Patient Reported Schizophrenia/Schizoaffective Diagnosis in Past: No   Strengths: able to ask for help   Mental Health Symptoms Depression:  None   Duration of Depressive symptoms:    Mania:  None   Anxiety:   None   Psychosis:  None   Duration of Psychotic symptoms:    Trauma:  None   Obsessions:  None   Compulsions:  None   Inattention:  N/A   Hyperactivity/Impulsivity:  N/A   Oppositional/Defiant Behaviors:  N/A   Emotional Irregularity:  None   Other Mood/Personality Symptoms:  none    Mental Status Exam Appearance and self-care  Stature:  Average   Weight:  Average weight   Clothing:  Casual; Neat/clean   Grooming:  Normal   Cosmetic use:  None   Posture/gait:   Normal   Motor activity:  Not Remarkable   Sensorium  Attention:  Normal   Concentration:  Normal   Orientation:  X5   Recall/memory:  Normal   Affect and Mood  Affect:  Flat   Mood:  Euthymic   Relating  Eye contact:  Normal   Facial expression:  Responsive   Attitude toward examiner:  Cooperative   Thought and Language  Speech flow: Clear and Coherent   Thought content:  Appropriate to Mood and Circumstances   Preoccupation:  None   Hallucinations:  None   Organization:  Coherent; Intact   [employer] of Knowledge:  Average   Intelligence:  Average   Abstraction:  Functional   Judgement:  Fair   Dance Movement Psychotherapist:  Adequate   Insight:  Fair; Flashes of insight   Decision Making:  Vacilates   Social  Functioning  Social Maturity:  Impulsive   Social Judgement:  Normal   Stress  Stressors:  -- (none reported)   Coping Ability:  Normal   Skill Deficits:  None   Supports:  Friends/Service system     Religion: Religion/Spirituality Are You A Religious Person?: Yes What is Your Religious Affiliation?: Christian How Might This Affect Treatment?: unknown  Leisure/Recreation: Leisure / Recreation Do You Have Hobbies?: Yes Leisure and Hobbies: playing cards  Exercise/Diet: Exercise/Diet Do You Exercise?: No Have You Gained or Lost A Significant Amount of Weight in the Past Six Months?: No Do You Follow a Special Diet?: No Do You Have Any Trouble Sleeping?: No   CCA Employment/Education Employment/Work Situation: Employment / Work Situation Employment Situation: Unemployed (Pt stated that he was injuryed on the job and had to have surgery which has not allowed him to work since.) Patient's Job has Been Impacted by Current Illness: No (none reported)  Education: Education Is Patient Currently Attending School?: No Last Grade Completed: 12 Did You Attend College?: No Did You Have An Individualized Education Program  (IIEP): No Did You Have Any Difficulty At School?: No Patient's Education Has Been Impacted by Current Illness: No   CCA Family/Childhood History Family and Relationship History: Family history Marital status: Widowed Widowed, when?: 2022, from second wife Does patient have children?: Yes How many children?: 2 (adult) How is patient's relationship with their children?: good  Childhood History:  Childhood History By whom was/is the patient raised?: Both parents Did patient suffer any verbal/emotional/physical/sexual abuse as a child?: No Did patient suffer from severe childhood neglect?: No Has patient ever been sexually abused/assaulted/raped as an adolescent or adult?: No Was the patient ever a victim of a crime or a disaster?: No Witnessed domestic violence?: No Has patient been affected by domestic violence as an adult?: No       CCA Substance Use Alcohol/Drug Use: Alcohol / Drug Use Pain Medications: see MAR Prescriptions: see MAR Over the Counter: see MAR History of alcohol / drug use?: Yes Longest period of sobriety (when/how long): unknown Negative Consequences of Use:  (none reported) Withdrawal Symptoms:  (none reported) Substance #1 Name of Substance 1: alcohol 1 - Age of First Use: teen 1 - Amount (size/oz): 1/2 to 1 pint 1 - Frequency: daily 1 - Duration: ongoing 1 - Last Use / Amount: last night - 2 shots 1 - Method of Aquiring: purchase 1- Route of Use: oral                       ASAM's:  Six Dimensions of Multidimensional Assessment  Dimension 1:  Acute Intoxication and/or Withdrawal Potential:   Dimension 1:  Description of individual's past and current experiences of substance use and withdrawal: none reported  Dimension 2:  Biomedical Conditions and Complications:   Dimension 2:  Description of patient's biomedical conditions and  complications: none reported  Dimension 3:  Emotional, Behavioral, or Cognitive Conditions and  Complications:  Dimension 3:  Description of emotional, behavioral, or cognitive conditions and complications: none reported  Dimension 4:  Readiness to Change:     Dimension 5:  Relapse, Continued use, or Continued Problem Potential:     Dimension 6:  Recovery/Living Environment:     ASAM Severity Score: ASAM's Severity Rating Score: 5  ASAM Recommended Level of Treatment: ASAM Recommended Level of Treatment: Level II Intensive Outpatient Treatment   Substance use Disorder (SUD) Substance Use Disorder (SUD)  Checklist Symptoms of  Substance Use: Continued use despite persistent or recurrent social, interpersonal problems, caused or exacerbated by use, Presence of craving or strong urge to use  Recommendations for Services/Supports/Treatments: Recommendations for Services/Supports/Treatments Recommendations For Services/Supports/Treatments: Detox, Facility Based Crisis  Disposition Recommendation per psychiatric provider: We recommend transfer to [facility]. Per Alan Mcardle NP pt is recommended for [facility] admission.    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   Hearing loss secondary to cerumen impaction, left 11/02/2023   Deficiency anemia 11/02/2023   Alcohol use disorder, moderate, dependence (HCC) 11/02/2023   PSA elevation 11/02/2023   Alcoholic hepatitis without ascites (HCC) 11/02/2023   Dietary folate deficiency anemia 11/02/2023   Vitamin B12 deficiency (dietary) anemia 11/02/2023   Non-recurrent bilateral inguinal hernia without obstruction or gangrene 07/07/2022   Encounter for general adult medical examination with abnormal findings 12/28/2020   Loud snoring 12/28/2020   Idiopathic chronic gout of multiple sites without tophus 12/28/2020   Elevated ferritin 02/17/2020   Essential hypertension 10/02/2019   Dyslipidemia, goal LDL below 100 10/02/2019   Hypersomnia  with sleep apnea 06/09/2016   Excessive daytime sleepiness 06/09/2016   Parasomnia, 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     Referrals to Alternative Service(s): Referred to Alternative Service(s):   Place:   Date:   Time:    Referred to Alternative Service(s):   Place:   Date:   Time:    Referred to Alternative Service(s):   Place:   Date:   Time:    Referred to Alternative Service(s):   Place:   Date:   Time:     Anam Bobby T, Counselor

## 2024-04-18 NOTE — ED Provider Notes (Addendum)
 Behavioral Health Urgent Care Medical Screening Exam  Patient Name: [name] MRN: [id number] Date of Evaluation: 04/18/24 Chief Complaint:  Alcohol detox Diagnosis:  Final diagnoses:  Alcohol use disorder, moderate, dependence (HCC)  Adjustment disorder with depressed mood    History of Present illness: [name], 59 y.o., male  presented to [facility] voluntarily as a walk in, accompanied by mobile crisis, with complaints of wanting to detox from alcohol and some ongoing depressive symptoms in the context of losing his job and having some medical issues. Patient seen face to face by this provider, and chart reviewed on 04/18/24.  Per chart review patient has a past psychiatric history of alcohol use disorder.  Pertinent medical history including arthritis, GERD, gout, hypercholesterolemia, hypertension, anemia and vitamin deficiency.  No current mental health treatment.  Patient reports previously attending the [facility] in 2020 due to a DWI.  Patient being followed for chronic medical conditions by [facility] primary care.  On evaluation [name] reports that he struggled with drinking alcohol for the past year since losing his job due to medical issues in February 2025.  Patient states that he has been working at [employer] for about 2 to 3 years and developed a bilateral hernia as a result of consistent heavy lifting on the job but was unable to qualify for [employer].  Patient states that he received short-term disability until he received a letter in the mail notifying him of termination.  Patient states that he required surgery for the hernia but since being fired from the job that did not have any insurance and was required to pay $1500 to receive the surgery.  Patient ended up having to hire a lawyer for wrongful termination.  Patient states that he did receive the hernia surgery once his Medicaid was approved.   Patient reports that the stress triggered him to relapse on alcohol.  Patient also reports struggling with adjustment to not being able to work and having medical issues.  Depressive symptoms include: Sadness, decreased sleep, fatigue and feeling somewhat guilty about relapsing.  Patient denies suicidal ideations, homicidal ideations and hallucinations.  Patient denies history of complicated alcohol withdrawal or withdrawal seizures.  Patient reports that he attempted to stop drinking alcohol last Monday however felt that he needed to use it almost as this medicine, to stop the withdrawal symptoms.  Patient reports withdrawal symptoms including tremors, sweating and diarrhea.  Discussed option for patient to be admitted to [facility] unit for detox and possible treatment of depressed mood which she is agreeable to.  Patient states that he feels once he gets through the withdrawal symptoms and detox states that he will be able to maintain his sobriety as he has done before.  He is hoping to discharge once detoxed and attend a SA IOP program, but patient is open to other options.   Name of Substance: Alcohol Amount using: About a pint a day Frequency of use: Daily How long using: 9 months Last used: 11 PM last night (04/17/24) Withdrawal symptoms: Tremors, sweating and diarrhea Longest period of sobriety: 4 to 5 years  During evaluation [name] is sitting up in assessment room, in no acute distress.  He is alert/oriented x 4; calm/cooperative; and mood congruent with affect. He is speaking in a clear tone at moderate volume, and normal pace; with good eye contact.  His thought process is coherent and relevant; There is no objective indication that he is currently responding to  internal/external stimuli or experiencing delusional thought content. He has denied suicidal ideation,self-harm, homicidal ideation, auditory hallucinations, visual hallucinations and/or paranoia. Patient has remained calm  throughout assessment and has answered questions appropriately.      Flowsheet Row ED from 04/18/2024 in [facility] Most recent reading at 04/18/2024  1:10 PM ED from 04/18/2024 in [facility] Most recent reading at 04/18/2024 10:52 AM UC from 03/15/2024 in [facility] Urgent Care at [facility]) Most recent reading at 03/15/2024  4:31 PM  C-SSRS RISK CATEGORY No Risk No Risk No Risk    Psychiatric Specialty Exam  Presentation  General Appearance:Appropriate for Environment  Eye Contact:Good  Speech:Clear and Coherent  Speech Volume:Normal  Handedness:Right   Mood and Affect  Mood: Depressed  Affect: Appropriate; Congruent   Thought Process  Thought Processes: Coherent; Linear  Descriptions of Associations:Intact  Orientation:Full (Time, Place and Person)  Thought Content:WDL  Diagnosis of Schizophrenia or Schizoaffective disorder in past: No   Hallucinations:None  Ideas of Reference:None  Suicidal Thoughts:No  Homicidal Thoughts:No   Sensorium  Memory: Immediate Good; Recent Fair  Judgment: Good  Insight: Good   Executive Functions  Concentration: Good  Attention Span: Good  Recall: Good  Fund of Knowledge: Good  Language: Good   Psychomotor Activity  Psychomotor Activity: Normal   Assets  Assets: Desire for Improvement; Housing; Social Support; Manufacturing Systems Engineer; Financial Resources/Insurance; Leisure Time; Resilience   Sleep  Sleep: Good  Number of hours:  7   Physical Exam: Physical Exam Vitals and nursing note reviewed.  Constitutional:      Appearance: Normal appearance.  HENT:     Head: Normocephalic.     Nose: Nose normal.  Eyes:     Extraocular Movements: Extraocular movements intact.  Cardiovascular:     Rate and Rhythm: Normal rate.  Pulmonary:     Effort: Pulmonary effort is normal.  Musculoskeletal:        General:  Normal range of motion.     Cervical back: Normal range of motion.  Neurological:     General: No focal deficit present.     Mental Status: He is alert and oriented to person, place, and time.  Psychiatric:        Attention and Perception: Attention and perception normal.        Mood and Affect: Mood is depressed.        Behavior: Behavior normal. Behavior is cooperative.        Thought Content: Thought content normal.        Cognition and Memory: Cognition normal.        Judgment: Judgment normal.    Review of Systems  Constitutional: Negative.   HENT: Negative.    Eyes: Negative.   Respiratory: Negative.    Cardiovascular: Negative.   Gastrointestinal: Negative.   Genitourinary: Negative.   Musculoskeletal: Negative.   Neurological: Negative.   Endo/Heme/Allergies: Negative.   Psychiatric/Behavioral:  Positive for depression and substance abuse.    Blood pressure (!) 148/87, pulse 99, temperature 99.3 F (37.4 C), temperature source Oral, resp. rate 17, SpO2 100%. There is no height or weight on file to calculate BMI.  Musculoskeletal: Strength & Muscle Tone: within normal limits Gait & Station: normal Patient leans: N/A   [facility] MSE Discharge Disposition for Follow up and Recommendations: Based on my evaluation I certify that psychiatric inpatient services furnished can reasonably be expected to improve the patient's condition which I recommend transfer to an appropriate  accepting facility.  [name], 59 y.o., male  presented to [facility] voluntarily as a walk in, accompanied by mobile crisis, with complaints of wanting to detox from alcohol and some ongoing depressive symptoms in the context of losing his job and having some medical issues. Patient reports that stress triggered him to relapse on alcohol.  Patient also reports struggling with adjustment to not being able to work and having medical issues.  Depressive symptoms include:  Sadness, decreased sleep, fatigue and feeling somewhat guilty about relapsing.  Symptoms appear to be in the context of life stressors being job loss due to medical issues adjustment disorder with depressed mood diagnosis.  Patient denies history of complicated alcohol withdrawal or withdrawal seizures.  Patient reports that he attempted to stop drinking alcohol last Monday however felt that he needed to use it almost as this medicine, to stop the withdrawal symptoms, which show signs of alcohol dependence.  Patient reports withdrawal symptoms including tremors, sweating and diarrhea.  Patient is open to discussing with social work, different options and resources upon discharge, but right now would prefer to do the SA IOP program after discharging.  Patient is voluntary and agreeable to admission to facility based crisis unit to detox from alcohol in a monitored environment.  Plan: - Admit to observation unit until appropriate bed in [facility] is found - Initiate agitation protocol per policy - Initiate CIWA protocol with as needed Ativan  for CIWA coverage - Labs, EKG, UA and UDS ordered for medical screening - Continue home medications including:  Amlodipine  5 mg daily for hypertension  Rosuvastatin  10 mg daily for hyperlipidemia  Thiamine  100 mg daily for thiamine  deficiency  Folic acid  daily for deficiency - New medications including:   Ativan  1 mg as needed every 6 hours for elevated CIWA's  Hydroxyzine  25 mg 3 times daily as needed for anxiety  Trazodone  50 mg nightly as needed for sleep  Tylenol  650 mg every 6 hours as needed for pain  Disposition: Patient accepted to [facility]  [name] JAYSON Mcardle, NP 04/18/2024, 1:19 PM

## 2024-04-18 NOTE — Group Note (Signed)
 Group Topic: Recovery Basics  Group Date: 04/18/2024 Start Time: 1300 End Time: 1400 Facilitators: Nicholaus Arlyne BIRCH, NT  Department: [facility]  Number of Participants: 3  Group Focus: daily focus Treatment Modality:  Psychoeducation Interventions utilized were patient education Purpose: relapse prevention strategies  Name: [name] Date of Birth: [birth year]  MR: [id number]    Level of Participation: Did not attend Quality of Participation: N/A Interactions with others: N/A Mood/Affect: N/A Triggers (if applicable): N/A Cognition: N/A Progress: None Response: N/A Plan: patient will be encouraged to attend groups  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   Hearing loss secondary to cerumen impaction, left 11/02/2023   Deficiency anemia 11/02/2023   Alcohol use disorder, moderate, dependence (HCC) 11/02/2023   PSA elevation 11/02/2023   Alcoholic hepatitis without ascites (HCC) 11/02/2023   Dietary folate deficiency anemia 11/02/2023   Vitamin B12 deficiency (dietary) anemia 11/02/2023   Non-recurrent bilateral inguinal hernia without obstruction or gangrene 07/07/2022   Encounter for general adult medical examination with abnormal findings 12/28/2020   Loud snoring 12/28/2020   Idiopathic chronic gout of multiple sites without tophus 12/28/2020   Elevated ferritin 02/17/2020   Essential hypertension 10/02/2019   Dyslipidemia, goal LDL below 100 10/02/2019   Hypersomnia with sleep apnea 06/09/2016   Excessive daytime sleepiness 06/09/2016   Parasomnia, 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

## 2024-04-18 NOTE — ED Notes (Signed)
 Pt provided with dinner. Pt currently in dayroom. Pt presents as somewhat anxious, cooperative and polite.

## 2024-04-18 NOTE — Progress Notes (Signed)
" °   04/18/24 1046  [facility] Triage Screening (Walk-ins at [facility] only)  What Is the Reason for Your Visit/Call Today? [name] 58y male presents to [facility] accompanied by a mobile health crisis counselor who has now left the building. PT wants help to stop drinking. PT states that he drinks everyday, maybe a pint. Pt is looking to detox. PT denies SI, HI, AVH and substance use.  How Long Has This Been Causing You Problems? > than 6 months  Have You Recently Had Any Thoughts About Hurting Yourself? No  Are You Planning to Commit Suicide/Harm Yourself At This time? No  Have you Recently Had Thoughts About Hurting Someone Sherral? No  Are You Planning To Harm Someone At This Time? No  Physical Abuse Denies  Verbal Abuse Yes, past (Comment)  Sexual Abuse Denies  Exploitation of patient/patient's resources Yes, past (Comment)  Self-Neglect Yes, present (Comment)  Are you currently experiencing any auditory, visual or other hallucinations? No  Have You Used Any Alcohol or Drugs in the Past 24 Hours? Yes  What Did You Use and How Much? alcohol - shots of liquor  Do you have any current medical co-morbidities that require immediate attention?  (hypertension)  Clinician description of patient physical appearance/behavior: calm, cooperative, normal  What Do You Feel Would Help You the Most Today? Alcohol or Drug Use Treatment  Determination of Need Routine (7 days)  Options For Referral Facility-Based Crisis    "

## 2024-04-18 NOTE — Care Plan (Signed)
 Interdisciplinary Treatment and Diagnostic Plan Update  04/18/2024 Time of Session: 2PM [name] MRN: [id number]  Diagnosis:  Final diagnoses:  Alcohol use disorder  Adjustment disorder with depressed mood     Current Medications:  Current Facility-Administered Medications  Medication Dose Route Frequency Provider Last Rate Last Admin   acetaminophen  (TYLENOL ) tablet 650 mg  650 mg Oral Q6H PRN Brent, Amanda C, NP       alum & mag hydroxide-simeth (MAALOX/MYLANTA) 200-200-20 MG/5ML suspension 30 mL  30 mL Oral Q4H PRN Brent, Amanda C, NP       amLODipine  (NORVASC ) tablet 5 mg  5 mg Oral Daily Brent, Amanda C, NP   5 mg at 04/18/24 1534   haloperidol  (HALDOL ) tablet 5 mg  5 mg Oral TID PRN Brent, Amanda C, NP       And   diphenhydrAMINE  (BENADRYL ) capsule 50 mg  50 mg Oral TID PRN Brent, Amanda C, NP       haloperidol  lactate (HALDOL ) injection 5 mg  5 mg Intramuscular TID PRN Brent, Amanda C, NP       And   diphenhydrAMINE  (BENADRYL ) injection 50 mg  50 mg Intramuscular TID PRN Brent, Amanda C, NP       And   LORazepam  (ATIVAN ) injection 2 mg  2 mg Intramuscular TID PRN Brent, Amanda C, NP       haloperidol  lactate (HALDOL ) injection 10 mg  10 mg Intramuscular TID PRN Brent, Amanda C, NP       And   diphenhydrAMINE  (BENADRYL ) injection 50 mg  50 mg Intramuscular TID PRN Brent, Amanda C, NP       And   LORazepam  (ATIVAN ) injection 2 mg  2 mg Intramuscular TID PRN Brent, Amanda C, NP       folic acid  (FOLVITE ) tablet 1 mg  1 mg Oral Daily Brent, Amanda C, NP   1 mg at 04/18/24 1535   hydrOXYzine  (ATARAX ) tablet 25 mg  25 mg Oral TID PRN Brent, Amanda C, NP       loperamide  (IMODIUM ) capsule 2-4 mg  2-4 mg Oral PRN Brent, Amanda C, NP       LORazepam  (ATIVAN ) tablet 1 mg  1 mg Oral Q6H PRN Brent, Amanda C, NP       LORazepam  (ATIVAN ) tablet 1 mg  1 mg Oral QID Ramzan, Mariam, NP   1 mg at 04/18/24 1535   Followed by   NOREEN ON 04/19/2024] LORazepam  (ATIVAN ) tablet 1 mg  1 mg  Oral TID Ramzan, Mariam, NP       Followed by   NOREEN ON 04/20/2024] LORazepam  (ATIVAN ) tablet 1 mg  1 mg Oral BID Ramzan, Mariam, NP       Followed by   NOREEN ON 04/22/2024] LORazepam  (ATIVAN ) tablet 1 mg  1 mg Oral Daily Ramzan, Mariam, NP       magnesium  hydroxide (MILK OF MAGNESIA) suspension 30 mL  30 mL Oral Daily PRN Brent, Amanda C, NP       multivitamin with minerals tablet 1 tablet  1 tablet Oral Daily Brent, Amanda C, NP   1 tablet at 04/18/24 1534   ondansetron  (ZOFRAN -ODT) disintegrating tablet 4 mg  4 mg Oral Q6H PRN Brent, Amanda C, NP       [START ON 04/19/2024] rosuvastatin  (CRESTOR ) tablet 10 mg  10 mg Oral Daily Brent, Amanda C, NP       [START ON 04/19/2024] thiamine  (VITAMIN B1) tablet 100 mg  100 mg  Oral Daily Brent, Amanda C, NP       traZODone  (DESYREL ) tablet 50 mg  50 mg Oral QHS PRN Brent, Amanda C, NP       Current Outpatient Medications  Medication Sig Dispense Refill   amLODipine  (NORVASC ) 5 MG tablet Take 1 tablet (5 mg total) by mouth daily. 90 tablet 0   Cyanocobalamin  (VITAMIN B-12 PO) Take 1 tablet by mouth daily.     folic acid  (FOLVITE ) 1 MG tablet Take 1 tablet (1 mg total) by mouth daily. 90 tablet 1   MAGNESIUM  PO Take 1 tablet by mouth daily.     oxymetazoline (AFRIN) 0.05 % nasal spray Place 1 spray into both nostrils 2 (two) times daily as needed for congestion.     rosuvastatin  (CRESTOR ) 10 MG tablet Take 1 tablet (10 mg total) by mouth daily. 90 tablet 0   thiamine  (VITAMIN B1) 100 MG tablet Take 1 tablet (100 mg total) by mouth daily. 90 tablet 0   PTA Medications: Prior to Admission medications  Medication Sig Start Date End Date Taking? Authorizing Provider  amLODipine  (NORVASC ) 5 MG tablet Take 1 tablet (5 mg total) by mouth daily. 11/02/23  Yes Joshua Debby CROME, MD  Cyanocobalamin  (VITAMIN B-12 PO) Take 1 tablet by mouth daily.   Yes [provider]  folic acid  (FOLVITE ) 1 MG tablet Take 1 tablet (1 mg total) by mouth daily. 11/02/23  Yes  Joshua Debby CROME, MD  MAGNESIUM  PO Take 1 tablet by mouth daily.   Yes [provider]  oxymetazoline (AFRIN) 0.05 % nasal spray Place 1 spray into both nostrils 2 (two) times daily as needed for congestion.   Yes [provider]  rosuvastatin  (CRESTOR ) 10 MG tablet Take 1 tablet (10 mg total) by mouth daily. 11/02/23  Yes Joshua Debby CROME, MD  thiamine  (VITAMIN B1) 100 MG tablet Take 1 tablet (100 mg total) by mouth daily. 11/02/23  Yes Joshua Debby CROME, MD    Patient Stressors: Financial difficulties   Health problems   Substance abuse    Patient Strengths: Ability for insight  Capable of independent living  Motivation for treatment/growth   Treatment Modalities: Medication Management, Group therapy, Case management,  1 to 1 session with clinician, Psychoeducation, Recreational therapy.   Physician Treatment Plan for Primary and Secondary Diagnosis:  Final diagnoses:  Alcohol use disorder  Adjustment disorder with depressed mood   Long Term Goal(s):    Short Term Goals:    Medication Management: Evaluate patient's response, side effects, and tolerance of medication regimen.  Therapeutic Interventions: 1 to 1 sessions, Unit Group sessions and Medication administration.  Evaluation of Outcomes: Progressing  LCSW Treatment Plan for Primary Diagnosis:  Final diagnoses:  Alcohol use disorder  Adjustment disorder with depressed mood    Long Term Goal(s): Safe transition to appropriate next level of care at discharge.  Short Term Goals: Facilitate acceptance of mental health diagnosis and concerns through verbal commitment to aftercare plan and appointments at discharge. and Identify minimum of 2 triggers associated with mental health/substance abuse issues with treatment team members.  Therapeutic Interventions: Assess for all discharge needs, 1 to 1 time with Child psychotherapist, Explore available resources and support systems, Assess for adequacy in community support  network, Educate family and significant other(s) on suicide prevention, Complete Psychosocial Assessment, Interpersonal group therapy.  Evaluation of Outcomes: Progressing   Progress in Treatment: Attending groups: No.  Client arrived today but will be encouraged to attend groups.  Participating in  groups: No Taking medication as prescribed: Yes. Toleration medication: Yes. Ativan  taper Family/Significant other contact made: No, but client reports he will make contact today with family later this evening.  Patient understands diagnosis: Yes. Discussing patient identified problems/goals with staff: Yes. Medical problems stabilized or resolved: As evidenced by:  Client is having shakes, some diarrhea, and sweating.  Client reports decreased symptoms since starting medications.  Denies suicidal/homicidal ideation: Yes. Issues/concerns per patient self-inventory: No. Other: NA  New problem(s) identified: No, Describe:  NA  New Short Term/Long Term Goal(s): Client will complete detox treatment by 04/25/24.  Client will take medications as prescribed and self report any symptoms/concerns.   Patient Goals:  get sober and decrease withdrawal symptoms.  Client reports he tried to detox on his own but was unable.  Writer will continue to explore outpatient therapy and residential treatment with the client.   Discharge Plan or Barriers: Client will discharge home to possibly outpatient treatment.  Client reports several years ago he completed courses through [facility].  Client reports he will update staff by tomorrow with a more tentative discharge plan.   Reason for Continuation of Hospitalization: Medication stabilization Withdrawal symptoms  Estimated Length of Stay:04/18/24 - 04/25/24  Last 3 [place] Suicide Severity Risk Score: Flowsheet Row ED from 04/18/2024 in [facility] Most recent reading at 04/18/2024  1:10 PM ED from 04/18/2024 in [facility] Most recent reading at 04/18/2024 10:52 AM UC from 03/15/2024 in [facility] Most recent reading at 03/15/2024  4:31 PM  C-SSRS RISK CATEGORY No Risk No Risk No Risk    Last PHQ 2/9 Scores:    04/18/2024    3:32 PM 08/02/2023    8:36 AM 07/07/2022   10:33 AM  Depression screen PHQ 2/9  Decreased Interest 1 0 0  Down, Depressed, Hopeless 1 0 0  PHQ - 2 Score 2 0 0  Altered sleeping 1  0  Tired, decreased energy 1  0  Change in appetite 0  0  Feeling bad or failure about yourself  1  0  Trouble concentrating 0  0  Moving slowly or fidgety/restless 0  0  Suicidal thoughts 0  0  PHQ-9 Score 5  0   Difficult doing work/chores Somewhat difficult  Not difficult at all     Data saved with a previous flowsheet row definition    Scribe for Treatment Team: Cindee Mclester, LCSW 04/18/2024 4:28 PM

## 2024-04-18 NOTE — ED Notes (Signed)
 Pt sleeping in bed, no acute distress noted. Respirations even and unlabored. Continue to monitor for safety.

## 2024-04-18 NOTE — Discharge Instructions (Signed)
 Transfer to [facility] for treatment

## 2024-04-19 LAB — URINE DRUG SCREEN
Amphetamines: NEGATIVE
Barbiturates: NEGATIVE
Benzodiazepines: POSITIVE — AB
Cocaine: NEGATIVE
Fentanyl: NEGATIVE
Methadone Scn, Ur: NEGATIVE
Opiates: NEGATIVE
Tetrahydrocannabinol: NEGATIVE

## 2024-04-19 LAB — VITAMIN D 25 HYDROXY (VIT D DEFICIENCY, FRACTURES): Vit D, 25-Hydroxy: 10.5 ng/mL — ABNORMAL LOW (ref 30–100)

## 2024-04-19 MED ORDER — VITAMIN D (ERGOCALCIFEROL) 1.25 MG (50000 UNIT) PO CAPS
50000.0000 [IU] | ORAL_CAPSULE | ORAL | Status: DC
  Administered 2024-04-19: 50000 [IU] via ORAL
  Filled 2024-04-19: qty 1

## 2024-04-19 MED ORDER — IBUPROFEN 200 MG PO TABS
200.0000 mg | ORAL_TABLET | Freq: Four times a day (QID) | ORAL | Status: DC | PRN
  Administered 2024-04-19 – 2024-04-21 (×3): 200 mg via ORAL
  Filled 2024-04-19 (×4): qty 1

## 2024-04-19 NOTE — Group Note (Signed)
 Group Topic: Emotional Regulation  Group Date: 04/19/2024 Start Time: 1330 End Time: 1400 Facilitators: Laneta Renea POUR, NT  Department: [facility]  Number of Participants: 4  Group Focus: coping skills and problem solving Treatment Modality:  Psychoeducation Interventions utilized were problem solving Purpose: enhance coping skills and relapse prevention strategies  Name: [name] Date of Birth: 17-Sep-1965  MR: [id number]     Level of Participation: active Quality of Participation: cooperative and engaged Interactions with others: gave feedback Mood/Affect: appropriate and positive Triggers (if applicable): none Cognition: coherent/clear and insightful Progress: Gaining insight Response: I am getting there. Plan: patient will be encouraged to attend group.   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   Hearing loss secondary to cerumen impaction, left 11/02/2023   Deficiency anemia 11/02/2023   Alcohol use disorder, moderate, dependence (HCC) 11/02/2023   PSA elevation 11/02/2023   Alcoholic hepatitis without ascites (HCC) 11/02/2023   Dietary folate deficiency anemia 11/02/2023   Vitamin B12 deficiency (dietary) anemia 11/02/2023   Non-recurrent bilateral inguinal hernia without obstruction or gangrene 07/07/2022   Encounter for general adult medical examination with abnormal findings 12/28/2020   Loud snoring 12/28/2020   Idiopathic chronic gout of multiple sites without tophus 12/28/2020   Elevated ferritin 02/17/2020   Essential hypertension 10/02/2019   Dyslipidemia, goal LDL below 100 10/02/2019   Hypersomnia with sleep apnea 06/09/2016   Excessive daytime sleepiness 06/09/2016   Parasomnia, 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   Nightmares REM-sleep type 06/09/2016   Erectile dysfunction due to diseases classified elsewhere  04/29/2016   Overweight (BMI 25.0-29.9) 11/17/2015

## 2024-04-19 NOTE — ED Notes (Signed)
 Attended [place] group.

## 2024-04-19 NOTE — ED Provider Notes (Addendum)
 Facility Based Crisis Admission H&P  Date: 04/19/24 Patient Name: [name] MRN: [id number] Chief Complaint: alcohol detox  Diagnoses:  Final diagnoses:  Alcohol use disorder  Adjustment disorder with depressed mood    HPI: Patient is a 59 year male seen face-to-face by this provider, chart reviewed on 04/19/2024 and case discussed with Dr. Lawrnce. He has a past psychiatric hx of alcohol use disorder. Patient appears in no acute distress. During evaluation, he is lying down in his bed and complains of feeling cold and was offered an additional blanket. He reports that his energy level and his mood is good. He rates his depression and anxiety today a 0 on a scale of 0-10 (10 being the worst). He corroborates the hx he provided to the provider yesterday that he walked in by mobile crisis with complaints of wanting to detox from alcohol. He reports that he relapsed on alcohol due to depression from losing his job and facing some medical issues. He reports that he had been working at [employer] for the past 2-3 years and had developed a bilateral hernia due to consistent heavy lifting on his job, but reports that he was unable to qualify for workers compensation. He received short term disability until he was terminated and ended up having to pay for the hernia surgery on his own. He reports the stress from losing his job caused him to relapse on alcohol, but otherwise he has been sober for 13 years by going to church and praying. Reports withdrawal symptoms of tremors, sweating, and diarrhea yesterday. Today, denies any withdrawal symptoms besides a mild tremor noted when patient extends his arms. He denies a hx of delirium tremens, alcohol related withdrawal seizures, and blackouts. Reports attending rehab at the [facility] back in 2020 for a DWI and hx of attending AA meetings in the past. Reports no current legal charges or upcoming court dates. Reports past DWI 5-6 years ago.  Reports that recently he had gone without taking his medical medications for a week. Reports currently having no psychiatrist or therapist. Denies any illicit drug use.   Reports alcohol use for past year since losing his job. Use of 1 pint/daily consistently for 9 months with last use on 11 pm on 04/17/2024. Reports using alcohol primarily to deal with his stress. He denies suicidal and homicidal ideations. Denies past hx of suicide attempts. Denies past psychiatric hospitalizations. Denies recent or past hx of engaging in self-harm or non-suicidal self-injurious behaviors. He denies auditory and visual hallucinations. He denies paranoia. He denies access to guns and weapons. Highest level of education is 12th grade. Unemployed for the past year. Used to build whole foods for work trucks.   Does not meet criteria for GAD at this time. Endorses not feeling down or hopeless currently, but reports feeling this way when his wife passed away 6 years ago. Reports sleep apnea where he only sleeps an hour at a time. Reports not using a CPAP machine. Reports anhedonia as he used to enjoy going to the park, lake, or fishing with his girlfriend. Reports feeling guilty that he sued the company that fired him, but was able to obtain $40,000 as a result which he reports he used a lot of on buying alcohol. Reports that he got lazy when he got fired from his job because he knew he still had the money his wife left when she passed away. Reports good energy and concentration. Reports that his appetite is improving. Reports some psychomotor  retardation.   Disposition: Patient is unsure if he is interested in inpatient rehab or outpatient resources for substance use treatment. Tentative discharge date for January 29th, but likely that patient can discharge closer to the end of his ativan  taper (04/23/2024). Discussed CD-IOP program upstairs at [facility].    PHQ 2-9:  Flowsheet Row ED from 04/18/2024 in [facility] Office Visit from 07/07/2022 in [facility] Office Visit from 04/29/2016 in [facility]  Thoughts that you would be better off dead, or of hurting yourself in some way Not at all Not at all Not at all  PHQ-9 Total Score 5 0 7    Flowsheet Row ED from 04/18/2024 in [facility] Most recent reading at 04/18/2024  1:10 PM ED from 04/18/2024 in [facility] Most recent reading at 04/18/2024 10:52 AM UC from 03/15/2024 in [facility] at [facility] Most recent reading at 03/15/2024  4:31 PM  C-SSRS RISK CATEGORY No Risk No Risk No Risk    Screenings    Flowsheet Row Most Recent Value  CIWA-Ar Total 2    Total Time spent with patient: 30 minutes  Musculoskeletal  Strength & Muscle Tone: within normal limits Gait & Station: normal Patient leans: N/A  Psychiatric Specialty Exam  Presentation General Appearance:  Appropriate for Environment; Fairly Groomed  Eye Contact: Good  Speech: Clear and Coherent  Speech Volume: Normal  Handedness: Right   Mood and Affect  Mood: Anxious  Affect: Appropriate; Congruent   Thought Process  Thought Processes: Coherent; Linear  Descriptions of Associations:Intact  Orientation:Full (Time, Place and Person)  Thought Content:WDL  Diagnosis of Schizophrenia or Schizoaffective disorder in past: No   Hallucinations:Hallucinations: None  Ideas of Reference:None  Suicidal Thoughts:Suicidal Thoughts: No  Homicidal Thoughts:Homicidal Thoughts: No   Sensorium  Memory: Immediate Good; Recent Fair  Judgment: Fair  Insight: Fair   Art Therapist  Concentration: Fair  Attention Span: Fair  Recall: [employer] of Knowledge: Fair  Language: Fair   Psychomotor Activity  Psychomotor Activity: Psychomotor Activity: Tremor   Assets   Assets: Manufacturing Systems Engineer; Desire for Improvement; Resilience; Financial Resources/Insurance; Housing   Sleep  Sleep: Sleep: Good Number of Hours of Sleep: 7   Nutritional Assessment (For OBS and FBC admissions only) Has the patient had a weight loss or gain of 10 pounds or more in the last 3 months?: No Has the patient had a decrease in food intake/or appetite?: No Does the patient have dental problems?: No Does the patient have eating habits or behaviors that may be indicators of an eating disorder including binging or inducing vomiting?: No Has the patient recently lost weight without trying?: 0 Has the patient been eating poorly because of a decreased appetite?: 0 Malnutrition Screening Tool Score: 0    Physical Exam Vitals and nursing note reviewed.  Constitutional:      Appearance: Normal appearance.  HENT:     Head: Normocephalic.     Nose: Nose normal.  Cardiovascular:     Rate and Rhythm: Normal rate.  Pulmonary:     Effort: Pulmonary effort is normal.  Musculoskeletal:        General: Normal range of motion.     Cervical back: Normal range of motion.  Neurological:     Mental Status: He is alert and oriented to person, place, and time.  Psychiatric:  Attention and Perception: Attention normal. He does not perceive auditory or visual hallucinations.        Mood and Affect: Affect normal. Mood is anxious.        Speech: Speech normal.        Behavior: Behavior normal. Behavior is cooperative.        Thought Content: Thought content normal. Thought content is not paranoid or delusional. Thought content does not include homicidal or suicidal ideation. Thought content does not include homicidal or suicidal plan.        Cognition and Memory: Cognition normal.        Judgment: Judgment normal.    Review of Systems  Constitutional:  Positive for chills.  HENT: Negative.    Eyes: Negative.   Respiratory: Negative.    Cardiovascular: Negative.    Gastrointestinal: Negative.   Genitourinary: Negative.   Musculoskeletal: Negative.   Skin: Negative.   Neurological:  Positive for tremors.  Endo/Heme/Allergies: Negative.   Psychiatric/Behavioral:  Positive for depression and substance abuse. Negative for hallucinations, memory loss and suicidal ideas. The patient is nervous/anxious. The patient does not have insomnia.     Blood pressure 119/76, pulse 100, temperature 98.2 F (36.8 C), resp. rate 18, SpO2 98%. There is no height or weight on file to calculate BMI.  Past Psychiatric History: alcohol use disorder  Is the patient at risk to self? No  Has the patient been a risk to self in the past 6 months? No .    Has the patient been a risk to self within the distant past? No   Is the patient a risk to others? No   Has the patient been a risk to others in the past 6 months? No   Has the patient been a risk to others within the distant past? Yes   Past Medical History: gout, hypercholesterolemia, anemia, and vitamin deficiency Past Medical History:  Diagnosis Date   Allergy    Arthritis    Compartment syndrome    right arm    GERD (gastroesophageal reflux disease)    History of kidney stones    Hypertension    self reported   Family History: Denies medical and psychiatric hx in family. Social History: Resides in [place]. His wife passed away 6 years ago. Unemployed for the past year.  Last Labs:  Admission on 04/18/2024  Component Date Value Ref Range Status   Opiates 04/19/2024 NEGATIVE  NEGATIVE Final   Cocaine 04/19/2024 NEGATIVE  NEGATIVE Final   Benzodiazepines 04/19/2024 POSITIVE (A)  NEGATIVE Final   Amphetamines 04/19/2024 NEGATIVE  NEGATIVE Final   Tetrahydrocannabinol 04/19/2024 NEGATIVE  NEGATIVE Final   Barbiturates 04/19/2024 NEGATIVE  NEGATIVE Final   Methadone Scn, Ur 04/19/2024 NEGATIVE  NEGATIVE Final   Fentanyl  04/19/2024 NEGATIVE  NEGATIVE Final   Comment: (NOTE) Drug screen is for Medical  Purposes only. Positive results are preliminary only. If confirmation is needed, notify lab within 5 days.  Drug Class                 Cutoff (ng/mL) Amphetamine and metabolites 1000 Barbiturate and metabolites 200 Benzodiazepine              200 Opiates and metabolites     300 Cocaine and metabolites     300 THC                         50 Fentanyl   5 Methadone                   300  Trazodone  is metabolized in vivo to several metabolites,  including pharmacologically active m-CPP, which is excreted in the  urine.  Immunoassay screens for amphetamines and MDMA have potential  cross-reactivity with these compounds and may provide false positive  result.  Performed at [facility], [address] [place], [place] [address]    Vit D, 25-Hydroxy 04/18/2024 10.5 (L)  30 - 100 ng/mL Final   Comment: (NOTE) Vitamin D deficiency has been defined by the Institute of Medicine  and an Endocrine Society practice guideline as a level of serum 25-OH  vitamin D less than 20 ng/mL (1,2). The Endocrine Society went on to  further define vitamin D insufficiency as a level between 21 and 29  ng/mL (2).  1. IOM (Institute of Medicine). 2010. Dietary reference intakes for  calcium  and D. [place]: [employer]. 2. Holick MF, Binkley Tetherow, Bischoff-Ferrari HA, et al. Evaluation,  treatment, and prevention of vitamin D deficiency: an Endocrine  Society clinical practice guideline, JCEM. 2011 Jul; 96(7): 1911-30.  Performed at [facility], [address], [place], [place] [address]   Admission on 04/18/2024, Discharged on 04/18/2024  Component Date Value Ref Range Status   WBC 04/18/2024 3.9 (L)  4.0 - 10.5 K/uL Final   RBC 04/18/2024 4.01 (L)  4.22 - 5.81 MIL/uL Final   Hemoglobin 04/18/2024 12.0 (L)  13.0 - 17.0 g/dL Final   HCT 98/77/7973 36.1 (L)  39.0 - 52.0 % Final   MCV 04/18/2024 90.0  80.0 - 100.0 fL Final   MCH 04/18/2024 29.9  26.0  - 34.0 pg Final   MCHC 04/18/2024 33.2  30.0 - 36.0 g/dL Final   RDW 98/77/7973 16.2 (H)  11.5 - 15.5 % Final   Platelets 04/18/2024 183  150 - 400 K/uL Final   nRBC 04/18/2024 0.0  0.0 - 0.2 % Final   Neutrophils Relative % 04/18/2024 71  % Final   Neutro Abs 04/18/2024 2.7  1.7 - 7.7 K/uL Final   Lymphocytes Relative 04/18/2024 15  % Final   Lymphs Abs 04/18/2024 0.6 (L)  0.7 - 4.0 K/uL Final   Monocytes Relative 04/18/2024 13  % Final   Monocytes Absolute 04/18/2024 0.5  0.1 - 1.0 K/uL Final   Eosinophils Relative 04/18/2024 1  % Final   Eosinophils Absolute 04/18/2024 0.0  0.0 - 0.5 K/uL Final   Basophils Relative 04/18/2024 0  % Final   Basophils Absolute 04/18/2024 0.0  0.0 - 0.1 K/uL Final   Immature Granulocytes 04/18/2024 0  % Final   Abs Immature Granulocytes 04/18/2024 0.01  0.00 - 0.07 K/uL Final   Performed at [facility], [address] [place], [place] [address]   Sodium 04/18/2024 136  135 - 145 mmol/L Final   Potassium 04/18/2024 3.7  3.5 - 5.1 mmol/L Final   Chloride 04/18/2024 96 (L)  98 - 111 mmol/L Final   CO2 04/18/2024 27  22 - 32 mmol/L Final   Glucose, Bld 04/18/2024 145 (H)  70 - 99 mg/dL Final   Glucose reference range applies only to samples taken after fasting for at least 8 hours.   BUN 04/18/2024 8  6 - 20 mg/dL Final   Creatinine, Ser 04/18/2024 0.69  0.61 - 1.24 mg/dL Final   Calcium  04/18/2024 9.6  8.9 - 10.3 mg/dL Final  Total Protein 04/18/2024 7.4  6.5 - 8.1 g/dL Final   Albumin 98/77/7973 4.5  3.5 - 5.0 g/dL Final   AST 98/77/7973 110 (H)  15 - 41 U/L Final   ALT 04/18/2024 55 (H)  0 - 44 U/L Final   Alkaline Phosphatase 04/18/2024 89  38 - 126 U/L Final   Total Bilirubin 04/18/2024 0.8  0.0 - 1.2 mg/dL Final   GFR, Estimated 04/18/2024 >60  >60 mL/min Final   Comment: (NOTE) Calculated using the CKD-EPI Creatinine Equation (2021)    Anion gap 04/18/2024 13  5 - 15 Final   Performed at [facility], [address]  [place], [place] [address]   Hgb A1c MFr Bld 04/18/2024 5.2  4.8 - 5.6 % Final   Comment: (NOTE) Diagnosis of Diabetes The following HbA1c ranges recommended by the American Diabetes Association (ADA) may be used as an aid in the diagnosis of diabetes mellitus.  Hemoglobin             Suggested A1C NGSP%              Diagnosis  <5.7                   Non Diabetic  5.7-6.4                Pre-Diabetic  >6.4                   Diabetic  <7.0                   Glycemic control for                       adults with diabetes.     Mean Plasma Glucose 04/18/2024 102.54  mg/dL Final   Performed at [facility], [address], [place], [place] [address]   Magnesium  04/18/2024 1.5 (L)  1.7 - 2.4 mg/dL Final   Performed at [facility], [address] [place], [place] [address]   Alcohol, Ethyl (B) 04/18/2024 <15  <15 mg/dL Final   Comment: (NOTE) For medical purposes only. Performed at [facility], [address], [place], [place] [address]    Vitamin B-12 04/18/2024 722  180 - 914 pg/mL Final   Performed at [facility], [address], [place], [place] [address]   Cholesterol 04/18/2024 222 (H)  0 - 200 mg/dL Final   Comment:        ATP III CLASSIFICATION:  <200     mg/dL   Desirable  799-760  mg/dL   Borderline High  >=759    mg/dL   High           Triglycerides 04/18/2024 100  <150 mg/dL Final   HDL 98/77/7973 88  >40 mg/dL Final   Total CHOL/HDL Ratio 04/18/2024 2.5  RATIO Final   VLDL 04/18/2024 20  0 - 40 mg/dL Final   LDL Cholesterol 04/18/2024 114 (H)  0 - 99 mg/dL Final   Comment:        Total Cholesterol/HDL:CHD Risk Coronary Heart Disease Risk Table                     Men   Women  1/2 Average Risk   3.4   3.3  Average Risk       5.0   4.4  2 X Average Risk  9.6   7.1  3 X Average Risk  23.4   11.0        Use the calculated Patient Ratio above and the CHD Risk Table to determine the patient's CHD Risk.        ATP III CLASSIFICATION  (LDL):  <100     mg/dL   Optimal  899-870  mg/dL   Near or Above                    Optimal  130-159  mg/dL   Borderline  839-810  mg/dL   High  >809     mg/dL   Very High Performed at [facility], [address], [place], [place] [address]    TSH 04/18/2024 1.190  0.350 - 4.500 uIU/mL Final   Performed at [facility], [address], [place], [place] [address]  Admission on 12/26/2023, Discharged on 12/26/2023  Component Date Value Ref Range Status   Neisseria Gonorrhea 12/26/2023 Negative   Final   Chlamydia 12/26/2023 Negative   Final   Trichomonas 12/26/2023 Negative   Final   Comment 12/26/2023 Normal Reference Ranger Chlamydia - Negative   Final   Comment 12/26/2023 Normal Reference Range Neisseria Gonorrhea - Negative   Final   Comment 12/26/2023 Normal Reference Range Trichomonas - Negative   Final  Office Visit on 11/02/2023  Component Date Value Ref Range Status   Uric Acid, Serum 11/02/2023 6.2  4.0 - 7.8 mg/dL Final   Sodium 91/92/7974 140  135 - 145 mEq/L Final   Potassium 11/02/2023 3.9  3.5 - 5.1 mEq/L Final   Chloride 11/02/2023 98  96 - 112 mEq/L Final   CO2 11/02/2023 25  19 - 32 mEq/L Final   Glucose, Bld 11/02/2023 91  70 - 99 mg/dL Final   BUN 91/92/7974 7  6 - 23 mg/dL Final   Creatinine, Ser 11/02/2023 0.63  0.40 - 1.50 mg/dL Final   GFR 91/92/7974 103.60  >60.00 mL/min Final   Calculated using the CKD-EPI Creatinine Equation (2021)   Calcium  11/02/2023 9.5  8.4 - 10.5 mg/dL Final   Total Bilirubin 11/02/2023 0.6  0.2 - 1.2 mg/dL Final   Bilirubin, Direct 11/02/2023 0.2  0.0 - 0.3 mg/dL Final   Alkaline Phosphatase 11/02/2023 58  39 - 117 U/L Final   AST 11/02/2023 51 (H)  0 - 37 U/L Final   ALT 11/02/2023 31  0 - 53 U/L Final   Total Protein 11/02/2023 7.4  6.0 - 8.3 g/dL Final   Albumin 91/92/7974 4.4  3.5 - 5.2 g/dL Final   INR 91/92/7974 1.4 (H)  0.8 - 1.0 ratio Final   Prothrombin Time 11/02/2023 15.1 (H)  9.6 - 13.1 sec Final   PSA  11/02/2023 7.29 (H)  0.10 - 4.00 ng/mL Final   Test performed using [employer] PSA Assay, a parmagnetic partical, chemiluminecent immunoassay.   Iron 11/02/2023 73  42 - 165 ug/dL Final   Transferrin 91/92/7974 232.0  212.0 - 360.0 mg/dL Final   Saturation Ratios 11/02/2023 22.5  20.0 - 50.0 % Final   Ferritin 11/02/2023 290.5  22.0 - 322.0 ng/mL Final   TIBC 11/02/2023 324.8  250.0 - 450.0 mcg/dL Final   Folate 91/92/7974 3.5 (L)  >5.9 ng/mL Final   WBC 11/02/2023 4.5  4.0 - 10.5 K/uL Final   RBC 11/02/2023 3.92 (L)  4.22 - 5.81 Mil/uL Final   Hemoglobin 11/02/2023 11.5 (L)  13.0 - 17.0 g/dL  Final   HCT 11/02/2023 34.9 (L)  39.0 - 52.0 % Final   MCV 11/02/2023 89.0  78.0 - 100.0 fl Final   MCHC 11/02/2023 32.9  30.0 - 36.0 g/dL Final   RDW 91/92/7974 15.4  11.5 - 15.5 % Final   Platelets 11/02/2023 249.0  150.0 - 400.0 K/uL Final   Neutrophils Relative % 11/02/2023 69.3  43.0 - 77.0 % Final   Lymphocytes Relative 11/02/2023 15.9  12.0 - 46.0 % Final   Monocytes Relative 11/02/2023 12.5 (H)  3.0 - 12.0 % Final   Eosinophils Relative 11/02/2023 1.4  0.0 - 5.0 % Final   Basophils Relative 11/02/2023 0.9  0.0 - 3.0 % Final   Neutro Abs 11/02/2023 3.1  1.4 - 7.7 K/uL Final   Lymphs Abs 11/02/2023 0.7  0.7 - 4.0 K/uL Final   Monocytes Absolute 11/02/2023 0.6  0.1 - 1.0 K/uL Final   Eosinophils Absolute 11/02/2023 0.1  0.0 - 0.7 K/uL Final   Basophils Absolute 11/02/2023 0.0  0.0 - 0.1 K/uL Final   Vitamin B1 (Thiamine ) 11/02/2023 <6 (L)  8 - 30 nmol/L Final   Comment: (Note) Vitamin supplementation within 24 hours prior to blood  draw may affect the accuracy of the results. . This test was developed and its analytical performance  characteristics have been determined by [employer]. It has not been cleared or approved by FDA.  This assay has been validated pursuant to the CLIA  regulations and is used for clinical purposes. . [facility] [address] [place] [address] [phone] Johanna Agent L. Gino, MD, PhD    Zinc  11/02/2023 CANCELED   Final   Comment: TEST NOT PERFORMED . The specimen submitted did not meet the specimen requirements. Please refer to the [employer] On-Line Test Directory or call Client Services for proper  requirements.  Result canceled by the ancillary.    Retic Ct Pct 11/02/2023 1.6  % Final   ABS Retic 11/02/2023 61,600  25,000 - 90,000 cells/uL Final   Extra tube recieved 11/02/2023    Final   Comment: An extra specimen was received with no test requested. The specimen will be maintained in storage in case  additional testing is needed. Please call the client service department for further assistance. SABRA    Specimen type recieved 11/02/2023 [place]-[facility];   Final    Allergies: Sildenafil  and Doxycycline  Medications:  Facility Ordered Medications  Medication   amLODipine  (NORVASC ) tablet 5 mg   folic acid  (FOLVITE ) tablet 1 mg   rosuvastatin  (CRESTOR ) tablet 10 mg   alum & mag hydroxide-simeth (MAALOX/MYLANTA) 200-200-20 MG/5ML suspension 30 mL   magnesium  hydroxide (MILK OF MAGNESIA) suspension 30 mL   thiamine  (VITAMIN B1) tablet 100 mg   multivitamin with minerals tablet 1 tablet   LORazepam  (ATIVAN ) tablet 1 mg   loperamide  (IMODIUM ) capsule 2-4 mg   ondansetron  (ZOFRAN -ODT) disintegrating tablet 4 mg   haloperidol  (HALDOL ) tablet 5 mg   And   diphenhydrAMINE  (BENADRYL ) capsule 50 mg   haloperidol  lactate (HALDOL ) injection 5 mg   And   diphenhydrAMINE  (BENADRYL ) injection 50 mg   And   LORazepam  (ATIVAN ) injection 2 mg   haloperidol  lactate (HALDOL ) injection 10 mg   And   diphenhydrAMINE  (BENADRYL ) injection 50 mg   And   LORazepam  (ATIVAN ) injection 2 mg   traZODone  (DESYREL ) tablet 50 mg   hydrOXYzine  (ATARAX ) tablet 25 mg   [COMPLETED] LORazepam  (ATIVAN ) tablet  1 mg   Followed by   LORazepam  (ATIVAN ) tablet 1 mg   Followed by   NOREEN ON  04/20/2024] LORazepam  (ATIVAN ) tablet 1 mg   Followed by   NOREEN ON 04/22/2024] LORazepam  (ATIVAN ) tablet 1 mg   magnesium  oxide (MAG-OX) tablet 400 mg   ibuprofen  (ADVIL ) tablet 200 mg   PTA Medications  Medication Sig   thiamine  (VITAMIN B1) 100 MG tablet Take 1 tablet (100 mg total) by mouth daily.   rosuvastatin  (CRESTOR ) 10 MG tablet Take 1 tablet (10 mg total) by mouth daily.   amLODipine  (NORVASC ) 5 MG tablet Take 1 tablet (5 mg total) by mouth daily.   folic acid  (FOLVITE ) 1 MG tablet Take 1 tablet (1 mg total) by mouth daily.    Long Term Goals: Improvement in symptoms so as ready for discharge  Short Term Goals: Patient will verbalize feelings in meetings with treatment team members., Patient will attend at least of 50% of the groups daily., Pt will complete the PHQ9 on admission, day 3 and discharge., Patient will participate in completing the [place] Suicide Severity Rating Scale, and Patient will take medications as prescribed daily.  Medical Decision Making  Discussed with the patient continued admission to the facility based crisis unit for medically supervised detoxification and assistance with placement at a residential rehabilitation treatment facility. Given the patient's history of chronic alcohol use, potential for withdrawal complications, medically supervised detoxification is indicated. Detox services will support safe withdrawal management, monitoring for acute medical and psychiatric issues, and linkage to ongoing treatment for substance use disorders. Discussed average length of stay of 3-5 days. Patient is agreeable to this plan.   Lab orders:  -lab add on to check folate levels  Lab results reviewed from 04/18/2024: -CMP with GFR within normal ranges besides mildly low chloride at 96, glucose elevated at 145, magnesium  mildly low at 1.5 (can be contributing to patient's tremors/leg weakness and magnesium  oxide has been started), AST elevated at 110 and ALT at 55  (likely due to chronic alcohol use) -Lipid profile within normal ranges besides elevated cholesterol at 222 and LDL at 114 for which he continues to take his home crestor  -vitamin B12 normal -CBC with differential within normal ranges besides mildly low WBC at 3.9, RBC mildly low at 4.01, hemoglobin mildly low at 12, HCT low at 36.1, and RDW mildy elevated at 16.2, lymps abs mildly low at 0.6 -hemoglobin A1C normal -TSH normal -vitamin D deficiency at 10.5- starting vitamin D 50,000 units once a week for 8 weeks with plan for his PCP to recheck the level -BAL <15  UDS reviewed from 04/19/2024: positive for benzos   Recommendations  Based on my evaluation the patient does not appear to have an emergency medical condition.  Medication Management: -Continue maalox/mylanta 30 mL by mouth every 4 hours PRN for indigestion -Continue norvasc  5 mg by mouth daily for HTN -Continue folic acid  1 mg by mouth daily for deficiency -Continue hydroxyzine  25 mg tid by mouth PRN for anxiety -Continue advil  by mouth every 6 hours PRN for mild/moderate pain or headaches -Continue imodium  2-4 mg by mouth as needed for diarrhea or loose stools -Continue Ativan  1 mg by mouth every 6 hours PRN for CIWA >10 -Continue Ativan  taper to help prevent alcohol withdrawal related complications with end date of 04/20/2024 at 10 AM -Continue milk of magnesia 30 mL by mouth daily for mild constipation -Continue magnesium  oxide 400 mg by mouth daily for hypomagnesia  -Continue multivitamin  with minerals tablet by mouth daily -Continue zofran  ODT 4 mg by mouth every 6 hours PRN for nausea/vomiting -Continue crestor  10 mg by mouth daily for hyperlipidemia -Continue thiamine  100 mg by mouth daily  -Continue trazodone  50 mg by mouth at bedtime PRN for sleep -Start Vitamin D 50,000 units once a week for vitamin D deficiency -Continue agitation protocol (see MAR)  Disposition: Patient is unsure if he is interested in  inpatient rehab or outpatient resources for substance use treatment. Tentative discharge date for January 29th, but likely that patient can discharge closer to the end of his ativan  taper (04/23/2024).  Discharge Planning: Social work and case management to assist with discharge planning and identification of hospital follow-up needs prior to discharge Estimated LOS: 3-5 days Discharge Concerns: Need to establish a safety plan; Medication compliance and effectiveness Discharge Goals: Return home with outpatient referrals for mental health follow-up including medication management/psychotherapy   Recommendations  Continue [facility] the [facility] and will wok with CSW to gain access to substance abuse rehab. Discharge date will be determined based on progress.   I certify that inpatient services furnished can reasonably be expected to improve the patient's condition.    Maebell Lyvers, NP 04/19/24  2:48 PM

## 2024-04-19 NOTE — Group Note (Signed)
 Group Topic: Wellness  Group Date: 04/19/2024 Start Time: 1600 End Time: 1630 Facilitators: Leron Charolette CROME, RN  Department: [facility]  Number of Participants: 4  Group Focus: nursing group Treatment Modality:  Cognitive Behavioral Therapy Interventions utilized were support Purpose: express feelings  Name: [name] Date of Birth: 1965-07-31  MR: [id number]    Level of Participation: Level of Participation: moderate Quality of Participation: cooperative Interactions with others: gave feedback Mood/Affect: depressed Triggers (if applicable): na Cognition: coherent/clear Progress: Moderate Response: appropriate  Plan: patient will be encouraged to continue to progress   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   Hearing loss secondary to cerumen impaction, left 11/02/2023   Deficiency anemia 11/02/2023   Alcohol use disorder, moderate, dependence (HCC) 11/02/2023   PSA elevation 11/02/2023   Alcoholic hepatitis without ascites (HCC) 11/02/2023   Dietary folate deficiency anemia 11/02/2023   Vitamin B12 deficiency (dietary) anemia 11/02/2023   Non-recurrent bilateral inguinal hernia without obstruction or gangrene 07/07/2022   Encounter for general adult medical examination with abnormal findings 12/28/2020   Loud snoring 12/28/2020   Idiopathic chronic gout of multiple sites without tophus 12/28/2020   Elevated ferritin 02/17/2020   Essential hypertension 10/02/2019   Dyslipidemia, goal LDL below 100 10/02/2019   Hypersomnia with sleep apnea 06/09/2016   Excessive daytime sleepiness 06/09/2016   Parasomnia, 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

## 2024-04-19 NOTE — Group Note (Signed)
 Group Topic: Wellness  Group Date: 04/19/2024 Start Time: 2030 End Time: 2100 Facilitators: Anice Benton LABOR, NT  Department: [facility]  Number of Participants: 4  Group Focus: check in Treatment Modality:  Individual Therapy and Solution-Focused Therapy Interventions utilized were mental fitness and support Purpose: express feelings and regain self-worth  Name: [name] Date of Birth: [birth year]  MR: [id number]    Level of Participation: active Quality of Participation: attentive Interactions with others: gave feedback Mood/Affect: appropriate Triggers (if applicable): N/A Cognition: goal directed Progress: Moderate Response: Good Plan: follow-up needed  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   Hearing loss secondary to cerumen impaction, left 11/02/2023   Deficiency anemia 11/02/2023   Alcohol use disorder, moderate, dependence (HCC) 11/02/2023   PSA elevation 11/02/2023   Alcoholic hepatitis without ascites (HCC) 11/02/2023   Dietary folate deficiency anemia 11/02/2023   Vitamin B12 deficiency (dietary) anemia 11/02/2023   Non-recurrent bilateral inguinal hernia without obstruction or gangrene 07/07/2022   Encounter for general adult medical examination with abnormal findings 12/28/2020   Loud snoring 12/28/2020   Idiopathic chronic gout of multiple sites without tophus 12/28/2020   Elevated ferritin 02/17/2020   Essential hypertension 10/02/2019   Dyslipidemia, goal LDL below 100 10/02/2019   Hypersomnia with sleep apnea 06/09/2016   Excessive daytime sleepiness 06/09/2016   Parasomnia, 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

## 2024-04-19 NOTE — ED Notes (Signed)
 Pt sitting in dayroom watching television and interacting with peers. No acute distress noted. No concerns voiced. Informed pt to notify staff with any needs or assistance. Pt verbalized understanding and agreement. Will continue to monitor for safety.

## 2024-04-19 NOTE — ED Notes (Signed)
 Pt sleeping in bed, no acute distress noted. Respirations even and unlabored. Continue to monitor for safety.

## 2024-04-19 NOTE — ED Notes (Signed)
 Pt denies SI/HI/AVH. Bilat feet pain reported, too soon for prn motrin . Compliant with scheduled meds; prn trazodone  given. He was calm, cooperative, and pleasant. No behavioral concerns at this time. Pt sleeping in bed, no acute distress noted. Respirations even and unlabored. Continue to monitor for safety.

## 2024-04-19 NOTE — ED Notes (Signed)
 Pt sitting in dayroom interacting with peers and watching TV. No acute distress noted. Continue to monitor for safety.

## 2024-04-20 ENCOUNTER — Other Ambulatory Visit: Payer: Self-pay | Admitting: Internal Medicine

## 2024-04-20 NOTE — ED Notes (Signed)
 Pt denies SI/HI/AVH. No reports of pain. Compliant with scheduled meds; prn trazodone  given. He was calm, cooperative, and pleasant. No behavioral concerns at this time. Pt sleeping in bed, no acute distress noted. Respirations even and unlabored. Continue to monitor for safety.

## 2024-04-20 NOTE — Progress Notes (Signed)
Pt is awake, alert and oriented X4. Pt did not voice any complaints of pain or discomfort. No signs of acute distress noted. Administered scheduled meds per order. Pt denies current SI/HI/AVH, plan or intent. Staff will monitor for pt's safety.

## 2024-04-20 NOTE — Discharge Instructions (Addendum)
 [facility] Care Management...  Patient declined residential treatment, requested out patient resources  Patient will discharge to home by 11:00 am...  [address]  [place] [address]    RN to arrange transportation  Scripts  Patient will follow up with...  [facility] [address] [place], [place] [address] [phone]    Community Resource Guide Outpatient Counseling/Substance Abuse Adult [employer] 211 is a great source of information about community services available.  Access by dialing 2-1-1 from anywhere in [place] , or by website -  pooledincome.pl.   Other Local Resources (Updated 01/2016)  Outpatient Counseling/ Substance Abuse Programs  Services     Address and Phone Number  [facility] ([facility])  Options include Individual counseling, group counseling, intensive outpatient program (several hours a day, several days a week) Offers depression assessments Provides methadone maintenance program [phone] [address], [address] [place], [place] [address]   [facility]  Offers partial hospitalization/day treatment and DUI/DWI programs [employer], private insurance [phone] [address], [address] [place], [place] [address]  [facility] [place], [place], private insurance [phone] [address] [place], [place]  [facility]   Services include intensive outpatient program (several hours a day, several days a week), outpatient treatment, DUI/DWI services, family education Also has some services specifically for [employer] transitional housing  [phone] [address] [place], [place] [address]     [facility] Accepts Medicare, private pay, and private insurance [phone] [address], [address] [place], [place] [address]  [employer] of Care Services include individual counseling, substance abuse  intensive outpatient program (several hours a day, several days a week), day treatment Florinda Many, Medicaid, private insurance [phone] [address], [address] [place], [place] [address]  [facility]  Offers substance abuse intensive outpatient program (several hours a day, several days a week), partial hospitalization program [phone] [address] [place], [place] [address]  [phone] [address] [place], [place] [address]  [phone] [address] [place], [place] [address]  [phone], [address] [place], [place] [address]  [facility] Individual counseling only Accepts private insurance only [phone] [address] [place], [place] [address]  [facility] Methadone maintenance program [phone] [address] [place], [place] [address]  [facility] providing substance abuse and mental health counseling Accepts Medicaid, Medicare, private insurance Offers sliding scale for uninsured [phone] [place], [place]   [facility] [place], [employer]. Offers individual counseling, and intensive in-home services [phone] [address], [address] [place], [place] [address]  [facility] individual counseling, family counseling, group therapy, domestic violence counseling, consumer credit counseling Accepts Medicare, Medicaid, private insurance Offers sliding scale for uninsured [phone] [address] [place], [place] [address]  [phone] [facility], [address] [place], [place] [address]  [facility] Offers individual, family and group counseling 3 locations - [place], [place], and [place]  [phone]  [address]  [place], [place] [address]  [address] [place], [place] [address]  [address] [place], [place] [address]  [facility]   Offers  psychiatric assessment, 8-week Intensive Outpatient Program (several hours a day, several times a week, daytime or evenings), early recovery group, family Program, medication management Private pay or private insurance only [phone], or  [phone] [address] [place], [place] [address]  [facility]  Offers individual, couples and family counseling Accepts Medicaid, private insurance, and sliding scale for uninsured [phone] [address] [place], [place] [address]  Alm Riding, MD Individual counseling Private insurance [phone] [address] [place], [place] [address]  [facility]  Offers assessment, substance abuse treatment, and behavioral health treatment [phone] [address] [place], [place] [address]  [facility] Individual counseling Accepts private insurance [phone] [address] [place], [place] [address]  [facility] Individual counseling Florinda Many, private insurance [phone] [address] [place], [place] [address]  [facility]   Serves children and adolescents ages 38 to 30 years of age Offers 90-day or longer intensive outpatient programs (3 hours a day, 3 days a week) Cost is $5000/month. Facility is out of network with all insurance companies. Accepts private pay. Does not accept Medicaid.   Offers scholarships and financial assistance [phone] [facility] [place], [place]  [facility] Individual counseling Medicare, private insurance [phone] [address] [place], [place] [address]  [facility]   Offers intensive outpatient program (several hours a day, several times a week) and partial hospitalization program [phone] [address] [place], [place] [address]  Elodie Anon, MD Individual counseling [phone] [address],  [address] [place], [place] [address]  [facility] Offers Christian counseling to individuals, couples, and families Accepts Medicare and private insurance; offers sliding scale for uninsured [phone] [address] [place], [place] [address]  [facility] [phone] [address] [place], [place] [address]  [facility] [employer] [facility] crisis counseling, individual counseling, group therapy, in-home therapy, domestic violence services, day treatment, DWI services, Administrator, Arts (CST), Doctor, Hospital ([facility]), substance abuse Intensive Outpatient Program (several hours a day, several times a week) 2 locations - [place] and [place] [phone] [address] [place], [place] [address]  [phone] [address] [place], [place] [address]  [facility]    Individual counseling and group therapy Accepts private insurance, [place], [place] [phone] [address] [place], [place]  [facility] Offers individual and family counseling Offers LGBTQ services Accepts private insurance and private pay [phone] [address] [place], [place] [address]  [facility] Full service practice for adult and pediatric patients Monday-Friday; 8:00a - 5:00p [phone] (Multiple locations)  [facility] [employer] [facility] individual counseling, group therapy, and outpatient detox Accepts private insurance [phone] [address] [place], [place]  [facility] Individual counseling Accepts Medicare, private insurance [phone] [address], [address] [place], [place] [address]  [employer] Individual counseling Accepts Medicare, private insurance [phone] [address] [place], [place] [address]  [facility]  Offers substance abuse Intensive Outpatient Program  (several hours a day, several times a week) [phone], or [phone] [place], [place]

## 2024-04-20 NOTE — ED Notes (Signed)
 Patient denies pain and is resting comfortably.

## 2024-04-20 NOTE — ED Notes (Signed)
 Pt sleeping in bed, no acute distress noted. Respirations even and unlabored. Continue to monitor for safety.

## 2024-04-20 NOTE — ED Provider Notes (Signed)
 Behavioral Health Progress Note  Date and Time: 04/20/2024 1:13 PM Name: [name] MRN:  [id number]  Subjective: Patient states I am doing okay.  I would like to go home early next week and I am interested in IOP.  [name] is a 59 year old male, hx alcohol use disorder, who presented to [facility] behavioral health urgent care on 04/18/2024.  Patient request alcohol detox.  Reported depressive symptoms after loss of employment and chronic medical issues upon arrival.  Chart reviewed and patient discussed with attending psychiatrist on 04/20/2024.  Patient reassessed by this nurse practitioner face-to-face.  Patient is seated, no apparent distress.  He is alert and oriented, pleasant and cooperative during assessment.  Patient presents with euthymic mood, congruent affect.    [name] endorses pins-and-needles sensation to bilateral feet, ongoing x 1 to 2 years.  He is followed by outpatient PCP, current medication regimen including vitamin B12 and thiamine  supplementation.  Patient verbalizes upcoming appointment with PCP, plans to discuss continued symptom symptoms.  Patient is positive for groups and attends meals in dining area.  He is compliant with medications.  [name] endorses average sleep and appetite.  States energy level is good.  Describes mood as improving. Patient denies symptoms withdrawal currently.  Denies cravings/urges to use alcohol or substance.  CIWA measures 1 today.  Lorazepam  taper continues.  [name] continues to deny SI/HI/AVH.  No history of suicide attempts.  There is no evidence of delusional thought content, no indication patient responding to internal stimuli.  Patient offered support and encouragement.    Diagnosis:  Final diagnoses:  Alcohol use disorder  Adjustment disorder with depressed mood    Total Time spent with patient: 20 minutes  Past Psychiatric History: Alcohol use disorder Past Medical History:  Past Medical  History:  Diagnosis Date   Allergy    Arthritis    Compartment syndrome    right arm    GERD (gastroesophageal reflux disease)    History of kidney stones    Hypertension    self reported    Family History: None reported Family Psychiatric  History: None reported Social History: Resides in [place], alone.  Denies access to weapons.  Receives income my wife's disability benefits.  [name] plans to seek employment wants IOP completed.  Additional Social History:                         Sleep: Good  Appetite:  Good  Current Medications:  Current Facility-Administered Medications  Medication Dose Route Frequency Provider Last Rate Last Admin   alum & mag hydroxide-simeth (MAALOX/MYLANTA) 200-200-20 MG/5ML suspension 30 mL  30 mL Oral Q4H PRN Brent, Amanda C, NP       amLODipine  (NORVASC ) tablet 5 mg  5 mg Oral Daily Brent, Amanda C, NP   5 mg at 04/20/24 9144   haloperidol  (HALDOL ) tablet 5 mg  5 mg Oral TID PRN Brent, Amanda C, NP       And   diphenhydrAMINE  (BENADRYL ) capsule 50 mg  50 mg Oral TID PRN Brent, Amanda C, NP       haloperidol  lactate (HALDOL ) injection 5 mg  5 mg Intramuscular TID PRN Brent, Amanda C, NP       And   diphenhydrAMINE  (BENADRYL ) injection 50 mg  50 mg Intramuscular TID PRN Brent, Amanda C, NP       And   LORazepam  (ATIVAN ) injection 2 mg  2 mg Intramuscular TID PRN Brent, Amanda C, NP  haloperidol  lactate (HALDOL ) injection 10 mg  10 mg Intramuscular TID PRN Brent, Amanda C, NP       And   diphenhydrAMINE  (BENADRYL ) injection 50 mg  50 mg Intramuscular TID PRN Brent, Amanda C, NP       And   LORazepam  (ATIVAN ) injection 2 mg  2 mg Intramuscular TID PRN Brent, Amanda C, NP       folic acid  (FOLVITE ) tablet 1 mg  1 mg Oral Daily Brent, Amanda C, NP   1 mg at 04/20/24 9145   hydrOXYzine  (ATARAX ) tablet 25 mg  25 mg Oral TID PRN Brent, Amanda C, NP       ibuprofen  (ADVIL ) tablet 200 mg  200 mg Oral Q6H PRN Ramzan, Mariam, NP   200 mg  at 04/19/24 1618   loperamide  (IMODIUM ) capsule 2-4 mg  2-4 mg Oral PRN Brent, Amanda C, NP       LORazepam  (ATIVAN ) tablet 1 mg  1 mg Oral Q6H PRN Brent, Amanda C, NP       LORazepam  (ATIVAN ) tablet 1 mg  1 mg Oral BID Ramzan, Mariam, NP       Followed by   NOREEN ON 04/22/2024] LORazepam  (ATIVAN ) tablet 1 mg  1 mg Oral Daily Ramzan, Mariam, NP       magnesium  hydroxide (MILK OF MAGNESIA) suspension 30 mL  30 mL Oral Daily PRN Brent, Amanda C, NP       magnesium  oxide (MAG-OX) tablet 400 mg  400 mg Oral Daily Ramzan, Mariam, NP   400 mg at 04/20/24 0854   multivitamin with minerals tablet 1 tablet  1 tablet Oral Daily Brent, Amanda C, NP   1 tablet at 04/20/24 9145   ondansetron  (ZOFRAN -ODT) disintegrating tablet 4 mg  4 mg Oral Q6H PRN Brent, Amanda C, NP       rosuvastatin  (CRESTOR ) tablet 10 mg  10 mg Oral Daily Brent, Amanda C, NP   10 mg at 04/20/24 9144   thiamine  (VITAMIN B1) tablet 100 mg  100 mg Oral Daily Brent, Amanda C, NP   100 mg at 04/20/24 9145   traZODone  (DESYREL ) tablet 50 mg  50 mg Oral QHS PRN Brent, Amanda C, NP   50 mg at 04/19/24 2123   Vitamin D  (Ergocalciferol ) (DRISDOL ) 1.25 MG (50000 UNIT) capsule 50,000 Units  50,000 Units Oral Q7 days Ramzan, Mariam, NP   50,000 Units at 04/19/24 1618   Current Outpatient Medications  Medication Sig Dispense Refill   amLODipine  (NORVASC ) 5 MG tablet Take 1 tablet (5 mg total) by mouth daily. 90 tablet 0   Cyanocobalamin  (VITAMIN B-12 PO) Take 1 tablet by mouth daily.     folic acid  (FOLVITE ) 1 MG tablet Take 1 tablet (1 mg total) by mouth daily. 90 tablet 1   MAGNESIUM  PO Take 1 tablet by mouth daily.     oxymetazoline (AFRIN) 0.05 % nasal spray Place 1 spray into both nostrils 2 (two) times daily as needed for congestion.     rosuvastatin  (CRESTOR ) 10 MG tablet Take 1 tablet (10 mg total) by mouth daily. 90 tablet 0   thiamine  (VITAMIN B1) 100 MG tablet Take 1 tablet (100 mg total) by mouth daily. 90 tablet 0    Labs  Lab  Results:  Admission on 04/18/2024  Component Date Value Ref Range Status   Opiates 04/19/2024 NEGATIVE  NEGATIVE Final   Cocaine 04/19/2024 NEGATIVE  NEGATIVE Final   Benzodiazepines 04/19/2024 POSITIVE (A)  NEGATIVE Final  Amphetamines 04/19/2024 NEGATIVE  NEGATIVE Final   Tetrahydrocannabinol 04/19/2024 NEGATIVE  NEGATIVE Final   Barbiturates 04/19/2024 NEGATIVE  NEGATIVE Final   Methadone Scn, Ur 04/19/2024 NEGATIVE  NEGATIVE Final   Fentanyl  04/19/2024 NEGATIVE  NEGATIVE Final   Comment: (NOTE) Drug screen is for Medical Purposes only. Positive results are preliminary only. If confirmation is needed, notify lab within 5 days.  Drug Class                 Cutoff (ng/mL) Amphetamine and metabolites 1000 Barbiturate and metabolites 200 Benzodiazepine              200 Opiates and metabolites     300 Cocaine and metabolites     300 THC                         50 Fentanyl                     5 Methadone                   300  Trazodone  is metabolized in vivo to several metabolites,  including pharmacologically active m-CPP, which is excreted in the  urine.  Immunoassay screens for amphetamines and MDMA have potential  cross-reactivity with these compounds and may provide false positive  result.  Performed at [facility], [address] [place], [place] [address]    Vit D, 25-Hydroxy 04/18/2024 10.5 (L)  30 - 100 ng/mL Final   Comment: (NOTE) Vitamin D  deficiency has been defined by the Institute of Medicine  and an Endocrine Society practice guideline as a level of serum 25-OH  vitamin D  less than 20 ng/mL (1,2). The Endocrine Society went on to  further define vitamin D  insufficiency as a level between 21 and 29  ng/mL (2).  1. IOM (Institute of Medicine). 2010. Dietary reference intakes for  calcium  and D. [place]: [employer]. 2. Holick MF, [place], Bischoff-Ferrari HA, et al. Evaluation,  treatment, and prevention of vitamin D   deficiency: an Endocrine  Society clinical practice guideline, JCEM. 2011 Jul; 96(7): 1911-30.  Performed at [facility], [address], [place], [place] [address]   Admission on 04/18/2024, Discharged on 04/18/2024  Component Date Value Ref Range Status   WBC 04/18/2024 3.9 (L)  4.0 - 10.5 K/uL Final   RBC 04/18/2024 4.01 (L)  4.22 - 5.81 MIL/uL Final   Hemoglobin 04/18/2024 12.0 (L)  13.0 - 17.0 g/dL Final   HCT 98/77/7973 36.1 (L)  39.0 - 52.0 % Final   MCV 04/18/2024 90.0  80.0 - 100.0 fL Final   MCH 04/18/2024 29.9  26.0 - 34.0 pg Final   MCHC 04/18/2024 33.2  30.0 - 36.0 g/dL Final   RDW 98/77/7973 16.2 (H)  11.5 - 15.5 % Final   Platelets 04/18/2024 183  150 - 400 K/uL Final   nRBC 04/18/2024 0.0  0.0 - 0.2 % Final   Neutrophils Relative % 04/18/2024 71  % Final   Neutro Abs 04/18/2024 2.7  1.7 - 7.7 K/uL Final   Lymphocytes Relative 04/18/2024 15  % Final   Lymphs Abs 04/18/2024 0.6 (L)  0.7 - 4.0 K/uL Final   Monocytes Relative 04/18/2024 13  % Final   Monocytes Absolute 04/18/2024 0.5  0.1 - 1.0 K/uL Final   Eosinophils Relative 04/18/2024 1  % Final   Eosinophils Absolute 04/18/2024 0.0  0.0 -  0.5 K/uL Final   Basophils Relative 04/18/2024 0  % Final   Basophils Absolute 04/18/2024 0.0  0.0 - 0.1 K/uL Final   Immature Granulocytes 04/18/2024 0  % Final   Abs Immature Granulocytes 04/18/2024 0.01  0.00 - 0.07 K/uL Final   Performed at [facility], [address] [place], [place] [address]   Sodium 04/18/2024 136  135 - 145 mmol/L Final   Potassium 04/18/2024 3.7  3.5 - 5.1 mmol/L Final   Chloride 04/18/2024 96 (L)  98 - 111 mmol/L Final   CO2 04/18/2024 27  22 - 32 mmol/L Final   Glucose, Bld 04/18/2024 145 (H)  70 - 99 mg/dL Final   Glucose reference range applies only to samples taken after fasting for at least 8 hours.   BUN 04/18/2024 8  6 - 20 mg/dL Final   Creatinine, Ser 04/18/2024 0.69  0.61 - 1.24 mg/dL Final   Calcium  04/18/2024 9.6  8.9 -  10.3 mg/dL Final   Total Protein 98/77/7973 7.4  6.5 - 8.1 g/dL Final   Albumin 98/77/7973 4.5  3.5 - 5.0 g/dL Final   AST 98/77/7973 110 (H)  15 - 41 U/L Final   ALT 04/18/2024 55 (H)  0 - 44 U/L Final   Alkaline Phosphatase 04/18/2024 89  38 - 126 U/L Final   Total Bilirubin 04/18/2024 0.8  0.0 - 1.2 mg/dL Final   GFR, Estimated 04/18/2024 >60  >60 mL/min Final   Comment: (NOTE) Calculated using the CKD-EPI Creatinine Equation (2021)    Anion gap 04/18/2024 13  5 - 15 Final   Performed at [facility], [address], [place], [place] [address]   Hgb A1c MFr Bld 04/18/2024 5.2  4.8 - 5.6 % Final   Comment: (NOTE) Diagnosis of Diabetes The following HbA1c ranges recommended by the American Diabetes Association (ADA) may be used as an aid in the diagnosis of diabetes mellitus.  Hemoglobin             Suggested A1C NGSP%              Diagnosis  <5.7                   Non Diabetic  5.7-6.4                Pre-Diabetic  >6.4                   Diabetic  <7.0                   Glycemic control for                       adults with diabetes.     Mean Plasma Glucose 04/18/2024 102.54  mg/dL Final   Performed at [facility], [address] [place], [place] [address]   Magnesium  04/18/2024 1.5 (L)  1.7 - 2.4 mg/dL Final   Performed at [facility], [address] [place], [place] [address]   Alcohol, Ethyl (B) 04/18/2024 <15  <15 mg/dL Final   Comment: (NOTE) For medical purposes only. Performed at [facility], [address], [place], [place] [address]    Vitamin B-12 04/18/2024 722  180 - 914 pg/mL Final   Performed at [facility], [address], [place], [place] [address]   Cholesterol 04/18/2024 222 (H)  0 - 200 mg/dL Final  Comment:        ATP III CLASSIFICATION:  <200     mg/dL   Desirable  799-760  mg/dL   Borderline High  >=759    mg/dL   High           Triglycerides 04/18/2024 100  <150 mg/dL Final   HDL 98/77/7973 88  >40  mg/dL Final   Total CHOL/HDL Ratio 04/18/2024 2.5  RATIO Final   VLDL 04/18/2024 20  0 - 40 mg/dL Final   LDL Cholesterol 04/18/2024 114 (H)  0 - 99 mg/dL Final   Comment:        Total Cholesterol/HDL:CHD Risk Coronary Heart Disease Risk Table                     Men   Women  1/2 Average Risk   3.4   3.3  Average Risk       5.0   4.4  2 X Average Risk   9.6   7.1  3 X Average Risk  23.4   11.0        Use the calculated Patient Ratio above and the CHD Risk Table to determine the patient's CHD Risk.        ATP III CLASSIFICATION (LDL):  <100     mg/dL   Optimal  899-870  mg/dL   Near or Above                    Optimal  130-159  mg/dL   Borderline  839-810  mg/dL   High  >809     mg/dL   Very High Performed at [facility], [address], [place], [place] [address]    TSH 04/18/2024 1.190  0.350 - 4.500 uIU/mL Final   Performed at [facility], [address] [place], [place] [address]  Admission on 12/26/2023, Discharged on 12/26/2023  Component Date Value Ref Range Status   Neisseria Gonorrhea 12/26/2023 Negative   Final   Chlamydia 12/26/2023 Negative   Final   Trichomonas 12/26/2023 Negative   Final   Comment 12/26/2023 Normal Reference Ranger Chlamydia - Negative   Final   Comment 12/26/2023 Normal Reference Range Neisseria Gonorrhea - Negative   Final   Comment 12/26/2023 Normal Reference Range Trichomonas - Negative   Final  Office Visit on 11/02/2023  Component Date Value Ref Range Status   Uric Acid, Serum 11/02/2023 6.2  4.0 - 7.8 mg/dL Final   Sodium 91/92/7974 140  135 - 145 mEq/L Final   Potassium 11/02/2023 3.9  3.5 - 5.1 mEq/L Final   Chloride 11/02/2023 98  96 - 112 mEq/L Final   CO2 11/02/2023 25  19 - 32 mEq/L Final   Glucose, Bld 11/02/2023 91  70 - 99 mg/dL Final   BUN 91/92/7974 7  6 - 23 mg/dL Final   Creatinine, Ser 11/02/2023 0.63  0.40 - 1.50 mg/dL Final   GFR 91/92/7974 103.60  >60.00 mL/min Final   Calculated using the CKD-EPI  Creatinine Equation (2021)   Calcium  11/02/2023 9.5  8.4 - 10.5 mg/dL Final   Total Bilirubin 11/02/2023 0.6  0.2 - 1.2 mg/dL Final   Bilirubin, Direct 11/02/2023 0.2  0.0 - 0.3 mg/dL Final   Alkaline Phosphatase 11/02/2023 58  39 - 117 U/L Final   AST 11/02/2023 51 (H)  0 - 37 U/L Final   ALT 11/02/2023 31  0 - 53 U/L Final   Total Protein  11/02/2023 7.4  6.0 - 8.3 g/dL Final   Albumin 91/92/7974 4.4  3.5 - 5.2 g/dL Final   INR 91/92/7974 1.4 (H)  0.8 - 1.0 ratio Final   Prothrombin Time 11/02/2023 15.1 (H)  9.6 - 13.1 sec Final   PSA 11/02/2023 7.29 (H)  0.10 - 4.00 ng/mL Final   Test performed using [employer] PSA Assay, a parmagnetic partical, chemiluminecent immunoassay.   Iron 11/02/2023 73  42 - 165 ug/dL Final   Transferrin 91/92/7974 232.0  212.0 - 360.0 mg/dL Final   Saturation Ratios 11/02/2023 22.5  20.0 - 50.0 % Final   Ferritin 11/02/2023 290.5  22.0 - 322.0 ng/mL Final   TIBC 11/02/2023 324.8  250.0 - 450.0 mcg/dL Final   Folate 91/92/7974 3.5 (L)  >5.9 ng/mL Final   WBC 11/02/2023 4.5  4.0 - 10.5 K/uL Final   RBC 11/02/2023 3.92 (L)  4.22 - 5.81 Mil/uL Final   Hemoglobin 11/02/2023 11.5 (L)  13.0 - 17.0 g/dL Final   HCT 91/92/7974 34.9 (L)  39.0 - 52.0 % Final   MCV 11/02/2023 89.0  78.0 - 100.0 fl Final   MCHC 11/02/2023 32.9  30.0 - 36.0 g/dL Final   RDW 91/92/7974 15.4  11.5 - 15.5 % Final   Platelets 11/02/2023 249.0  150.0 - 400.0 K/uL Final   Neutrophils Relative % 11/02/2023 69.3  43.0 - 77.0 % Final   Lymphocytes Relative 11/02/2023 15.9  12.0 - 46.0 % Final   Monocytes Relative 11/02/2023 12.5 (H)  3.0 - 12.0 % Final   Eosinophils Relative 11/02/2023 1.4  0.0 - 5.0 % Final   Basophils Relative 11/02/2023 0.9  0.0 - 3.0 % Final   Neutro Abs 11/02/2023 3.1  1.4 - 7.7 K/uL Final   Lymphs Abs 11/02/2023 0.7  0.7 - 4.0 K/uL Final   Monocytes Absolute 11/02/2023 0.6  0.1 - 1.0 K/uL Final   Eosinophils Absolute 11/02/2023 0.1  0.0 - 0.7 K/uL Final    Basophils Absolute 11/02/2023 0.0  0.0 - 0.1 K/uL Final   Vitamin B1 (Thiamine ) 11/02/2023 <6 (L)  8 - 30 nmol/L Final   Comment: (Note) Vitamin supplementation within 24 hours prior to blood  draw may affect the accuracy of the results. . This test was developed and its analytical performance  characteristics have been determined by [employer]. It has not been cleared or approved by FDA.  This assay has been validated pursuant to the CLIA  regulations and is used for clinical purposes. . [facility] [address] [place] [address] [phone] Johanna Agent L. Gino, MD, PhD    Zinc  11/02/2023 CANCELED   Final   Comment: TEST NOT PERFORMED . The specimen submitted did not meet the specimen requirements. Please refer to the [employer] On-Line Test Directory or call Client Services for proper  requirements.  Result canceled by the ancillary.    Retic Ct Pct 11/02/2023 1.6  % Final   ABS Retic 11/02/2023 61,600  25,000 - 90,000 cells/uL Final   Extra tube recieved 11/02/2023    Final   Comment: An extra specimen was received with no test requested. The specimen will be maintained in storage in case  additional testing is needed. Please call the client service department for further assistance. SABRA    Specimen type recieved 11/02/2023 [place]-EDTA;   Final    Blood Alcohol level:  Lab Results  Component Value Date   [facility] <15 04/18/2024  ETH 206 (H) 10/18/2023    Metabolic Disorder Labs: Lab Results  Component Value Date   HGBA1C 5.2 04/18/2024   MPG 102.54 04/18/2024   No results found for: PROLACTIN Lab Results  Component Value Date   CHOL 222 (H) 04/18/2024   TRIG 100 04/18/2024   HDL 88 04/18/2024   CHOLHDL 2.5 04/18/2024   VLDL 20 04/18/2024   LDLCALC 114 (H) 04/18/2024   LDLCALC 131 (H) 08/02/2023    Therapeutic Lab Levels: No results found for: LITHIUM No results found for: VALPROATE No  results found for: CBMZ  Physical Findings   AUDIT    Flowsheet Row Office Visit from 07/07/2022 in [facility]  Alcohol Use Disorder Identification Test Final Score (AUDIT) 6   PHQ2-9    Flowsheet Row ED from 04/18/2024 in [facility] Office Visit from 08/02/2023 in [facility] [place] Office Visit from 07/07/2022 in [facility] Office Visit from 06/17/2021 in [facility] Office Visit from 12/28/2020 in [facility]  PHQ-2 Total Score 2 0 0 1 0  PHQ-9 Total Score 5 -- 0 -- --   Flowsheet Row ED from 04/18/2024 in [facility] Most recent reading at 04/18/2024  1:10 PM ED from 04/18/2024 in [facility] Most recent reading at 04/18/2024 10:52 AM UC from 03/15/2024 in [facility]) Most recent reading at 03/15/2024  4:31 PM  C-SSRS RISK CATEGORY No Risk No Risk No Risk     Musculoskeletal  Strength & Muscle Tone: within normal limits Gait & Station: normal Patient leans: N/A  Psychiatric Specialty Exam  Presentation  General Appearance:  Appropriate for Environment; Casual  Eye Contact: Good  Speech: Clear and Coherent; Normal Rate  Speech Volume: Normal  Handedness: Right   Mood and Affect  Mood: Euthymic  Affect: Appropriate; Congruent   Thought Process  Thought Processes: Coherent; Goal Directed; Linear  Descriptions of Associations:Intact  Orientation:Full (Time, Place and Person)  Thought Content:Logical; WDL  Diagnosis of Schizophrenia or Schizoaffective disorder in past: No    Hallucinations:Hallucinations: None  Ideas of Reference:None  Suicidal Thoughts:Suicidal Thoughts: No  Homicidal Thoughts:Homicidal Thoughts: No   Sensorium  Memory: Immediate Good;  Recent Fair  Judgment: Fair  Insight: Fair   Art Therapist  Concentration: Good  Attention Span: Good  Recall: Good  Fund of Knowledge: Good  Language: Good   Psychomotor Activity  Psychomotor Activity: Psychomotor Activity: Tremor   Assets  Assets: Communication Skills; Desire for Improvement; Financial Resources/Insurance; Housing   Sleep  Sleep: Sleep: Good  Estimated Sleeping Duration (Last 24 Hours): 10.00-12.00 hours  No data recorded  Physical Exam  Physical Exam Vitals and nursing note reviewed.  Constitutional:      Appearance: Normal appearance. He is well-developed.  HENT:     Head: Normocephalic and atraumatic.     Nose: Nose normal.  Cardiovascular:     Rate and Rhythm: Normal rate.  Pulmonary:     Effort: Pulmonary effort is normal.  Musculoskeletal:        General: Normal range of motion.     Cervical back: Normal range of motion.  Skin:    General: Skin is warm and dry.  Neurological:     Mental Status: He is alert and oriented to person, place, and time.  Psychiatric:  Attention and Perception: Attention and perception normal.        Mood and Affect: Mood and affect normal.        Speech: Speech normal.        Behavior: Behavior normal. Behavior is cooperative.        Thought Content: Thought content normal.        Cognition and Memory: Cognition and memory normal.        Judgment: Judgment normal.    Review of Systems  Constitutional: Negative.   HENT: Negative.    Eyes: Negative.   Respiratory: Negative.    Cardiovascular: Negative.   Gastrointestinal: Negative.   Genitourinary: Negative.   Musculoskeletal: Negative.   Skin: Negative.   Neurological:  Positive for tingling.       Hx neuropathy bilateral lower extremity  Psychiatric/Behavioral:  Positive for substance abuse.    Blood pressure 122/84, pulse 84, temperature 98.1 F (36.7 C), temperature source Oral, resp. rate 18, SpO2 99%. There is  no height or weight on file to calculate BMI.  Treatment Plan Summary: Continued admission to [facility]. Daily contact with patient to assess and evaluate symptoms and progress in treatment  Labs reviewed: CMP scheduled 04/21/2024.  Medications: -Continue maalox/mylanta 30 mL by mouth every 4 hours PRN for indigestion -Continue norvasc  5 mg by mouth daily for HTN -Continue folic acid  1 mg by mouth daily for deficiency -Continue hydroxyzine  25 mg tid by mouth PRN for anxiety -Continue advil  by mouth every 6 hours PRN for mild/moderate pain or headaches -Continue imodium  2-4 mg by mouth as needed for diarrhea or loose stools -Continue Ativan  1 mg by mouth every 6 hours PRN for CIWA >10 -Continue Ativan  taper to help prevent alcohol withdrawal related complications with end date of 04/20/2024 at 10 AM -Continue milk of magnesia 30 mL by mouth daily for mild constipation -Continue magnesium  oxide 400 mg by mouth daily for hypomagnesia  -Continue multivitamin with minerals tablet by mouth daily -Continue zofran  ODT 4 mg by mouth every 6 hours PRN for nausea/vomiting -Continue crestor  10 mg by mouth daily for hyperlipidemia -Continue thiamine  100 mg by mouth daily  -Continue trazodone  50 mg by mouth at bedtime PRN for sleep -Continue Vitamin D  50,000 units once a week for vitamin D  deficiency -Continue agitation protocol (see MAR)  Discharge Planning: Disposition/case management to assist with discharge planning and identification of follow-up needs including residential or outpatient substance use treatment options as needed, prior to discharge Discharge Concerns: Need to confirm safety plan; Medication compliance and effectiveness Discharge Goals: Return home with outpatient referrals for mental health follow-up including medication management/psychotherapy and substance use treatment follow-up resources as appropriate  Ellouise LITTIE Dawn, FNP 04/20/2024 1:13 PM

## 2024-04-20 NOTE — Care Management (Addendum)
 [facility] Care Management...   Writer met with patient to discuss discharge planning.  Patient reported still unsure of plan due to patient recently applying for position as a cook. 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  Writer discuss CD-IOP, patient was receptive to attending CD-IOP once he here's from potential employer  Writer advised patient that he will need to follow-up with [facility] CD-IOP. Writer will send secure chat to get process started.   Patient is on ativan  Taper scheduled to discharge 04/25/24  Upon discharge patient will discharge to home....  [address]  [place] [address]   RN to arrange transportation  Writer will provide out patient resources   Patient will follow up with...  [facility] [address] [place], [place] [address] [phone]

## 2024-04-20 NOTE — ED Notes (Signed)
 Pt received at the beginning of the shift in his bed,  No complaints for distress or discomfort.  Safety maintained.

## 2024-04-20 NOTE — Group Note (Signed)
 Group Topic: Understanding Self  Group Date: 04/20/2024 Start Time: 2030 End Time: 2100 Facilitators: Luvenia Mae SAUNDERS, NT  Department: [facility]  Number of Participants: 6  Group Focus: self-awareness and self-esteem Treatment Modality:  Leisure Development Interventions utilized were assignment Purpose: regain self-worth  Name: [name] Date of Birth: 04-14-1965  MR: [id number]    Level of Participation: active Quality of Participation: attentive and cooperative Interactions with others: gave feedback Mood/Affect: appropriate Triggers (if applicable): none Cognition: coherent/clear Progress: Gaining insight Response: none Plan: patient will be encouraged to continue with group  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   Hearing loss secondary to cerumen impaction, left 11/02/2023   Deficiency anemia 11/02/2023   Alcohol use disorder, moderate, dependence (HCC) 11/02/2023   PSA elevation 11/02/2023   Alcoholic hepatitis without ascites (HCC) 11/02/2023   Dietary folate deficiency anemia 11/02/2023   Vitamin B12 deficiency (dietary) anemia 11/02/2023   Non-recurrent bilateral inguinal hernia without obstruction or gangrene 07/07/2022   Encounter for general adult medical examination with abnormal findings 12/28/2020   Loud snoring 12/28/2020   Idiopathic chronic gout of multiple sites without tophus 12/28/2020   Elevated ferritin 02/17/2020   Essential hypertension 10/02/2019   Dyslipidemia, goal LDL below 100 10/02/2019   Hypersomnia with sleep apnea 06/09/2016   Excessive daytime sleepiness 06/09/2016   Parasomnia, 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

## 2024-04-21 LAB — COMPREHENSIVE METABOLIC PANEL WITH GFR
ALT: 91 U/L — ABNORMAL HIGH (ref 0–44)
AST: 140 U/L — ABNORMAL HIGH (ref 15–41)
Albumin: 4.2 g/dL (ref 3.5–5.0)
Alkaline Phosphatase: 75 U/L (ref 38–126)
Anion gap: 11 (ref 5–15)
BUN: 11 mg/dL (ref 6–20)
CO2: 27 mmol/L (ref 22–32)
Calcium: 10 mg/dL (ref 8.9–10.3)
Chloride: 98 mmol/L (ref 98–111)
Creatinine, Ser: 1.03 mg/dL (ref 0.61–1.24)
GFR, Estimated: >60 mL/min
Glucose, Bld: 116 mg/dL — ABNORMAL HIGH (ref 70–99)
Potassium: 4.6 mmol/L (ref 3.5–5.1)
Sodium: 135 mmol/L (ref 135–145)
Total Bilirubin: 0.4 mg/dL (ref 0.0–1.2)
Total Protein: 7.4 g/dL (ref 6.5–8.1)

## 2024-04-21 LAB — FERRITIN: Ferritin: 809 ng/mL — ABNORMAL HIGH (ref 24–336)

## 2024-04-21 LAB — FOLATE: Folate: 15.7 ng/mL

## 2024-04-21 LAB — IRON AND TIBC
Iron: 38 ug/dL — ABNORMAL LOW (ref 45–182)
Saturation Ratios: 12 % — ABNORMAL LOW (ref 17.9–39.5)
TIBC: 316 ug/dL (ref 250–450)
UIBC: 279 ug/dL

## 2024-04-21 MED ORDER — GABAPENTIN 100 MG PO CAPS
100.0000 mg | ORAL_CAPSULE | Freq: Three times a day (TID) | ORAL | Status: DC
  Administered 2024-04-21 – 2024-04-22 (×3): 100 mg via ORAL
  Filled 2024-04-21 (×3): qty 1

## 2024-04-21 MED ORDER — CYANOCOBALAMIN 1000 MCG/ML IJ SOLN
1000.0000 ug | Freq: Once | INTRAMUSCULAR | Status: AC
  Administered 2024-04-21: 1000 ug via INTRAMUSCULAR
  Filled 2024-04-21: qty 1

## 2024-04-21 NOTE — ED Notes (Signed)
 Pt is resting quietly without complaints for angry  thoughts or agitation.  Safety maintained.

## 2024-04-21 NOTE — Group Note (Signed)
 Group Topic: Change and Accountability  Group Date: 04/21/2024 Start Time: 2030 End Time: 2100 Facilitators: Luvenia Mae SAUNDERS, NT  Department: [facility]  Number of Participants: 6  Group Focus: coping skills and goals/reality orientation Treatment Modality:  Leisure Development Interventions utilized were assignment Purpose: regain self-worth  Name: [name] Date of Birth: September 23, 1965  MR: [id number]    Level of Participation: active Quality of Participation: cooperative Interactions with others: gave feedback Mood/Affect: appropriate and positive Triggers (if applicable): none Cognition: coherent/clear Progress: Gaining insight Response: none Plan: patient will be encouraged to continue with group  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   Hearing loss secondary to cerumen impaction, left 11/02/2023   Deficiency anemia 11/02/2023   Alcohol use disorder, moderate, dependence (HCC) 11/02/2023   PSA elevation 11/02/2023   Alcoholic hepatitis without ascites (HCC) 11/02/2023   Dietary folate deficiency anemia 11/02/2023   Vitamin B12 deficiency (dietary) anemia 11/02/2023   Non-recurrent bilateral inguinal hernia without obstruction or gangrene 07/07/2022   Encounter for general adult medical examination with abnormal findings 12/28/2020   Loud snoring 12/28/2020   Idiopathic chronic gout of multiple sites without tophus 12/28/2020   Elevated ferritin 02/17/2020   Essential hypertension 10/02/2019   Dyslipidemia, goal LDL below 100 10/02/2019   Hypersomnia with sleep apnea 06/09/2016   Excessive daytime sleepiness 06/09/2016   Parasomnia, 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

## 2024-04-21 NOTE — ED Provider Notes (Signed)
 Behavioral Health Progress Note  Date and Time: 04/21/2024 9:13 PM Name: [name] MRN:  [id number]  Subjective:  Patient discusses the death of his second wife 4 years ago. 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 Patient says he is going to do outpatient rehab. Patient discusses the illness and death of his wife in detail.  He complains of neuropathy, numbness and tingling in legs. He has had this for a time. He was previously diagnosed with B12 def and given B12 injections. Even though B12 levels are read as above normal given continued numbness and tingling could still be B12 def.   Diagnosis:  Final diagnoses:  Alcohol use disorder  Adjustment disorder with depressed mood  B12 deficiency    Total Time spent with patient: 45 minutes  Past Psychiatric History: No psych dx, no psych hosp. No SAs Past Medical History: Neuropathy Family History: HTN, kidney disease Family Psychiatric  History: denies, no suicides Social History: Resides in [place], alone.  Denies access to weapons.  Receives income my wife's disability benefits.  Kramer plans to seek employment wants IOP completed.  Widowed from second wife for whom he was the caregiver for 5 years. She died 4 years ago.   Sleep: Good  Appetite:  Good  Current Medications:  Current Facility-Administered Medications  Medication Dose Route Frequency Provider Last Rate Last Admin   alum & mag hydroxide-simeth (MAALOX/MYLANTA) 200-200-20 MG/5ML suspension 30 mL  30 mL Oral Q4H PRN Brent, Amanda C, NP       amLODipine  (NORVASC ) tablet 5 mg  5 mg Oral Daily Brent, Amanda C, NP   5 mg at 04/21/24 0930   haloperidol  (HALDOL ) tablet 5 mg  5 mg Oral TID PRN Brent, Amanda C, NP       And   diphenhydrAMINE  (BENADRYL ) capsule 50 mg  50 mg Oral TID PRN Brent, Amanda C, NP       haloperidol  lactate (HALDOL ) injection 5 mg  5 mg Intramuscular TID PRN Brent, Amanda C, NP       And   diphenhydrAMINE   (BENADRYL ) injection 50 mg  50 mg Intramuscular TID PRN Brent, Amanda C, NP       And   LORazepam  (ATIVAN ) injection 2 mg  2 mg Intramuscular TID PRN Brent, Amanda C, NP       haloperidol  lactate (HALDOL ) injection 10 mg  10 mg Intramuscular TID PRN Brent, Amanda C, NP       And   diphenhydrAMINE  (BENADRYL ) injection 50 mg  50 mg Intramuscular TID PRN Brent, Amanda C, NP       And   LORazepam  (ATIVAN ) injection 2 mg  2 mg Intramuscular TID PRN Brent, Amanda C, NP       folic acid  (FOLVITE ) tablet 1 mg  1 mg Oral Daily Brent, Amanda C, NP   1 mg at 04/21/24 9066   gabapentin  (NEURONTIN ) capsule 100 mg  100 mg Oral TID Lajada Janes J, MD   100 mg at 04/21/24 1645   hydrOXYzine  (ATARAX ) tablet 25 mg  25 mg Oral TID PRN Brent, Amanda C, NP   25 mg at 04/20/24 2119   ibuprofen  (ADVIL ) tablet 200 mg  200 mg Oral Q6H PRN Ramzan, Mariam, NP   200 mg at 04/20/24 1551   [START ON 04/22/2024] LORazepam  (ATIVAN ) tablet 1 mg  1 mg Oral Daily Ramzan, Mariam, NP       magnesium  hydroxide (MILK OF MAGNESIA) suspension 30  mL  30 mL Oral Daily PRN Brent, Amanda C, NP   30 mL at 04/21/24 1645   magnesium  oxide (MAG-OX) tablet 400 mg  400 mg Oral Daily Ramzan, Mariam, NP   400 mg at 04/21/24 9066   multivitamin with minerals tablet 1 tablet  1 tablet Oral Daily Brent, Amanda C, NP   1 tablet at 04/21/24 9066   rosuvastatin  (CRESTOR ) tablet 10 mg  10 mg Oral Daily Brent, Amanda C, NP   10 mg at 04/21/24 9068   thiamine  (VITAMIN B1) tablet 100 mg  100 mg Oral Daily Brent, Amanda C, NP   100 mg at 04/21/24 9066   traZODone  (DESYREL ) tablet 50 mg  50 mg Oral QHS PRN Brent, Amanda C, NP   50 mg at 04/20/24 2119   Vitamin D  (Ergocalciferol ) (DRISDOL ) 1.25 MG (50000 UNIT) capsule 50,000 Units  50,000 Units Oral Q7 days Ramzan, Mariam, NP   50,000 Units at 04/19/24 1618   Current Outpatient Medications  Medication Sig Dispense Refill   amLODipine  (NORVASC ) 5 MG tablet Take 1 tablet (5 mg total) by mouth daily. 90  tablet 0   Cyanocobalamin  (VITAMIN B-12 PO) Take 1 tablet by mouth daily.     folic acid  (FOLVITE ) 1 MG tablet Take 1 tablet (1 mg total) by mouth daily. 90 tablet 1   MAGNESIUM  PO Take 1 tablet by mouth daily.     oxymetazoline (AFRIN) 0.05 % nasal spray Place 1 spray into both nostrils 2 (two) times daily as needed for congestion.     rosuvastatin  (CRESTOR ) 10 MG tablet Take 1 tablet (10 mg total) by mouth daily. 90 tablet 0   thiamine  (VITAMIN B1) 100 MG tablet Take 1 tablet (100 mg total) by mouth daily. 90 tablet 0    Labs  Lab Results:  Admission on 04/18/2024  Component Date Value Ref Range Status   Opiates 04/19/2024 NEGATIVE  NEGATIVE Final   Cocaine 04/19/2024 NEGATIVE  NEGATIVE Final   Benzodiazepines 04/19/2024 POSITIVE (A)  NEGATIVE Final   Amphetamines 04/19/2024 NEGATIVE  NEGATIVE Final   Tetrahydrocannabinol 04/19/2024 NEGATIVE  NEGATIVE Final   Barbiturates 04/19/2024 NEGATIVE  NEGATIVE Final   Methadone Scn, Ur 04/19/2024 NEGATIVE  NEGATIVE Final   Fentanyl  04/19/2024 NEGATIVE  NEGATIVE Final   Comment: (NOTE) Drug screen is for Medical Purposes only. Positive results are preliminary only. If confirmation is needed, notify lab within 5 days.  Drug Class                 Cutoff (ng/mL) Amphetamine and metabolites 1000 Barbiturate and metabolites 200 Benzodiazepine              200 Opiates and metabolites     300 Cocaine and metabolites     300 THC                         50 Fentanyl                     5 Methadone                   300  Trazodone  is metabolized in vivo to several metabolites,  including pharmacologically active m-CPP, which is excreted in the  urine.  Immunoassay screens for amphetamines and MDMA have potential  cross-reactivity with these compounds and may provide false positive  result.  Performed at [facility], [address] [place], [place] [address]  Vit D, 25-Hydroxy 04/18/2024 10.5 (L)  30 - 100 ng/mL Final   Comment:  (NOTE) Vitamin D  deficiency has been defined by the Institute of Medicine  and an Endocrine Society practice guideline as a level of serum 25-OH  vitamin D  less than 20 ng/mL (1,2). The Endocrine Society went on to  further define vitamin D  insufficiency as a level between 21 and 29  ng/mL (2).  1. IOM (Institute of Medicine). 2010. Dietary reference intakes for  calcium  and D. [place]: The [employer]. 2. Holick MF, Binkley [place], Bischoff-Ferrari HA, et al. Evaluation,  treatment, and prevention of vitamin D  deficiency: an Endocrine  Society clinical practice guideline, JCEM. 2011 Jul; 96(7): 1911-30.  Performed at [facility], [address], [place], [place] [address]    Ferritin 04/21/2024 809 (H)  24 - 336 ng/mL Final   Performed at [facility], [address] [place], [place] [address]   Iron 04/21/2024 38 (L)  45 - 182 ug/dL Final   TIBC 98/74/7973 316  250 - 450 ug/dL Final   Saturation Ratios 04/21/2024 12 (L)  17.9 - 39.5 % Final   UIBC 04/21/2024 279  ug/dL Final   Performed at [facility], [address], [place], [place] [address]   Sodium 04/21/2024 135  135 - 145 mmol/L Final   Potassium 04/21/2024 4.6  3.5 - 5.1 mmol/L Final   Chloride 04/21/2024 98  98 - 111 mmol/L Final   CO2 04/21/2024 27  22 - 32 mmol/L Final   Glucose, Bld 04/21/2024 116 (H)  70 - 99 mg/dL Final   Glucose reference range applies only to samples taken after fasting for at least 8 hours.   BUN 04/21/2024 11  6 - 20 mg/dL Final   Creatinine, Ser 04/21/2024 1.03  0.61 - 1.24 mg/dL Final   Calcium  04/21/2024 10.0  8.9 - 10.3 mg/dL Final   Total Protein 98/74/7973 7.4  6.5 - 8.1 g/dL Final   Albumin 98/74/7973 4.2  3.5 - 5.0 g/dL Final   AST 98/74/7973 140 (H)  15 - 41 U/L Final   ALT 04/21/2024 91 (H)  0 - 44 U/L Final   Alkaline Phosphatase 04/21/2024 75  38 - 126 U/L Final   Total Bilirubin 04/21/2024 0.4  0.0 - 1.2 mg/dL Final   GFR, Estimated 04/21/2024  >60  >60 mL/min Final   Comment: (NOTE) Calculated using the CKD-EPI Creatinine Equation (2021)    Anion gap 04/21/2024 11  5 - 15 Final   Performed at [facility], [address], [place], [place] [address]   Folate 04/21/2024 15.7  >5.9 ng/mL Final   Performed at [facility], [address] [place], [place] [address]  Admission on 04/18/2024, Discharged on 04/18/2024  Component Date Value Ref Range Status   WBC 04/18/2024 3.9 (L)  4.0 - 10.5 K/uL Final   RBC 04/18/2024 4.01 (L)  4.22 - 5.81 MIL/uL Final   Hemoglobin 04/18/2024 12.0 (L)  13.0 - 17.0 g/dL Final   HCT 98/77/7973 36.1 (L)  39.0 - 52.0 % Final   MCV 04/18/2024 90.0  80.0 - 100.0 fL Final   MCH 04/18/2024 29.9  26.0 - 34.0 pg Final   MCHC 04/18/2024 33.2  30.0 - 36.0 g/dL Final   RDW 98/77/7973 16.2 (H)  11.5 - 15.5 % Final   Platelets 04/18/2024 183  150 - 400 K/uL Final   nRBC 04/18/2024 0.0  0.0 -  0.2 % Final   Neutrophils Relative % 04/18/2024 71  % Final   Neutro Abs 04/18/2024 2.7  1.7 - 7.7 K/uL Final   Lymphocytes Relative 04/18/2024 15  % Final   Lymphs Abs 04/18/2024 0.6 (L)  0.7 - 4.0 K/uL Final   Monocytes Relative 04/18/2024 13  % Final   Monocytes Absolute 04/18/2024 0.5  0.1 - 1.0 K/uL Final   Eosinophils Relative 04/18/2024 1  % Final   Eosinophils Absolute 04/18/2024 0.0  0.0 - 0.5 K/uL Final   Basophils Relative 04/18/2024 0  % Final   Basophils Absolute 04/18/2024 0.0  0.0 - 0.1 K/uL Final   Immature Granulocytes 04/18/2024 0  % Final   Abs Immature Granulocytes 04/18/2024 0.01  0.00 - 0.07 K/uL Final   Performed at [facility], [address] [place], [place] [address]   Sodium 04/18/2024 136  135 - 145 mmol/L Final   Potassium 04/18/2024 3.7  3.5 - 5.1 mmol/L Final   Chloride 04/18/2024 96 (L)  98 - 111 mmol/L Final   CO2 04/18/2024 27  22 - 32 mmol/L Final   Glucose, Bld 04/18/2024 145 (H)  70 - 99 mg/dL Final   Glucose reference range applies only to samples taken after  fasting for at least 8 hours.   BUN 04/18/2024 8  6 - 20 mg/dL Final   Creatinine, Ser 04/18/2024 0.69  0.61 - 1.24 mg/dL Final   Calcium  04/18/2024 9.6  8.9 - 10.3 mg/dL Final   Total Protein 98/77/7973 7.4  6.5 - 8.1 g/dL Final   Albumin 98/77/7973 4.5  3.5 - 5.0 g/dL Final   AST 98/77/7973 110 (H)  15 - 41 U/L Final   ALT 04/18/2024 55 (H)  0 - 44 U/L Final   Alkaline Phosphatase 04/18/2024 89  38 - 126 U/L Final   Total Bilirubin 04/18/2024 0.8  0.0 - 1.2 mg/dL Final   GFR, Estimated 04/18/2024 >60  >60 mL/min Final   Comment: (NOTE) Calculated using the CKD-EPI Creatinine Equation (2021)    Anion gap 04/18/2024 13  5 - 15 Final   Performed at [facility], [address], [place], [place] [address]   Hgb A1c MFr Bld 04/18/2024 5.2  4.8 - 5.6 % Final   Comment: (NOTE) Diagnosis of Diabetes The following HbA1c ranges recommended by the American Diabetes Association (ADA) may be used as an aid in the diagnosis of diabetes mellitus.  Hemoglobin             Suggested A1C NGSP%              Diagnosis  <5.7                   Non Diabetic  5.7-6.4                Pre-Diabetic  >6.4                   Diabetic  <7.0                   Glycemic control for                       adults with diabetes.     Mean Plasma Glucose 04/18/2024 102.54  mg/dL Final   Performed at [facility], [address], [place], [place] [address]   Magnesium  04/18/2024 1.5 (L)  1.7 - 2.4 mg/dL Final  Performed at [facility], [address], [place], [place] [address]   Alcohol, Ethyl (B) 04/18/2024 <15  <15 mg/dL Final   Comment: (NOTE) For medical purposes only. Performed at [facility], [address], [place], [place] [address]    Vitamin B-12 04/18/2024 722  180 - 914 pg/mL Final   Performed at [facility], [address], [place], [place] [address]   Cholesterol 04/18/2024 222 (H)  0 - 200 mg/dL Final   Comment:        ATP III CLASSIFICATION:  <200      mg/dL   Desirable  799-760  mg/dL   Borderline High  >=759    mg/dL   High           Triglycerides 04/18/2024 100  <150 mg/dL Final   HDL 98/77/7973 88  >40 mg/dL Final   Total CHOL/HDL Ratio 04/18/2024 2.5  RATIO Final   VLDL 04/18/2024 20  0 - 40 mg/dL Final   LDL Cholesterol 04/18/2024 114 (H)  0 - 99 mg/dL Final   Comment:        Total Cholesterol/HDL:CHD Risk Coronary Heart Disease Risk Table                     Men   Women  1/2 Average Risk   3.4   3.3  Average Risk       5.0   4.4  2 X Average Risk   9.6   7.1  3 X Average Risk  23.4   11.0        Use the calculated Patient Ratio above and the CHD Risk Table to determine the patient's CHD Risk.        ATP III CLASSIFICATION (LDL):  <100     mg/dL   Optimal  899-870  mg/dL   Near or Above                    Optimal  130-159  mg/dL   Borderline  839-810  mg/dL   High  >809     mg/dL   Very High Performed at [facility], [address] [place], [place] [address]    TSH 04/18/2024 1.190  0.350 - 4.500 uIU/mL Final   Performed at [facility], [address], [place], [place] [address]  Admission on 12/26/2023, Discharged on 12/26/2023  Component Date Value Ref Range Status   Neisseria Gonorrhea 12/26/2023 Negative   Final   Chlamydia 12/26/2023 Negative   Final   Trichomonas 12/26/2023 Negative   Final   Comment 12/26/2023 Normal Reference Ranger Chlamydia - Negative   Final   Comment 12/26/2023 Normal Reference Range Neisseria Gonorrhea - Negative   Final   Comment 12/26/2023 Normal Reference Range Trichomonas - Negative   Final  Office Visit on 11/02/2023  Component Date Value Ref Range Status   Uric Acid, Serum 11/02/2023 6.2  4.0 - 7.8 mg/dL Final   Sodium 91/92/7974 140  135 - 145 mEq/L Final   Potassium 11/02/2023 3.9  3.5 - 5.1 mEq/L Final   Chloride 11/02/2023 98  96 - 112 mEq/L Final   CO2 11/02/2023 25  19 - 32 mEq/L Final   Glucose, Bld 11/02/2023 91  70 - 99 mg/dL Final   BUN 91/92/7974 7   6 - 23 mg/dL Final   Creatinine, Ser 11/02/2023 0.63  0.40 - 1.50 mg/dL Final   GFR 91/92/7974 103.60  >  60.00 mL/min Final   Calculated using the CKD-EPI Creatinine Equation (2021)   Calcium  11/02/2023 9.5  8.4 - 10.5 mg/dL Final   Total Bilirubin 11/02/2023 0.6  0.2 - 1.2 mg/dL Final   Bilirubin, Direct 11/02/2023 0.2  0.0 - 0.3 mg/dL Final   Alkaline Phosphatase 11/02/2023 58  39 - 117 U/L Final   AST 11/02/2023 51 (H)  0 - 37 U/L Final   ALT 11/02/2023 31  0 - 53 U/L Final   Total Protein 11/02/2023 7.4  6.0 - 8.3 g/dL Final   Albumin 91/92/7974 4.4  3.5 - 5.2 g/dL Final   INR 91/92/7974 1.4 (H)  0.8 - 1.0 ratio Final   Prothrombin Time 11/02/2023 15.1 (H)  9.6 - 13.1 sec Final   PSA 11/02/2023 7.29 (H)  0.10 - 4.00 ng/mL Final   Test performed using [employer] PSA Assay, a parmagnetic partical, chemiluminecent immunoassay.   Iron 11/02/2023 73  42 - 165 ug/dL Final   Transferrin 91/92/7974 232.0  212.0 - 360.0 mg/dL Final   Saturation Ratios 11/02/2023 22.5  20.0 - 50.0 % Final   Ferritin 11/02/2023 290.5  22.0 - 322.0 ng/mL Final   TIBC 11/02/2023 324.8  250.0 - 450.0 mcg/dL Final   Folate 91/92/7974 3.5 (L)  >5.9 ng/mL Final   WBC 11/02/2023 4.5  4.0 - 10.5 K/uL Final   RBC 11/02/2023 3.92 (L)  4.22 - 5.81 Mil/uL Final   Hemoglobin 11/02/2023 11.5 (L)  13.0 - 17.0 g/dL Final   HCT 91/92/7974 34.9 (L)  39.0 - 52.0 % Final   MCV 11/02/2023 89.0  78.0 - 100.0 fl Final   MCHC 11/02/2023 32.9  30.0 - 36.0 g/dL Final   RDW 91/92/7974 15.4  11.5 - 15.5 % Final   Platelets 11/02/2023 249.0  150.0 - 400.0 K/uL Final   Neutrophils Relative % 11/02/2023 69.3  43.0 - 77.0 % Final   Lymphocytes Relative 11/02/2023 15.9  12.0 - 46.0 % Final   Monocytes Relative 11/02/2023 12.5 (H)  3.0 - 12.0 % Final   Eosinophils Relative 11/02/2023 1.4  0.0 - 5.0 % Final   Basophils Relative 11/02/2023 0.9  0.0 - 3.0 % Final   Neutro Abs 11/02/2023 3.1  1.4 - 7.7 K/uL Final   Lymphs Abs  11/02/2023 0.7  0.7 - 4.0 K/uL Final   Monocytes Absolute 11/02/2023 0.6  0.1 - 1.0 K/uL Final   Eosinophils Absolute 11/02/2023 0.1  0.0 - 0.7 K/uL Final   Basophils Absolute 11/02/2023 0.0  0.0 - 0.1 K/uL Final   Vitamin B1 (Thiamine ) 11/02/2023 <6 (L)  8 - 30 nmol/L Final   Comment: (Note) Vitamin supplementation within 24 hours prior to blood  draw may affect the accuracy of the results. . This test was developed and its analytical performance  characteristics have been determined by [employer]. It has not been cleared or approved by FDA.  This assay has been validated pursuant to the CLIA  regulations and is used for clinical purposes. . [facility] [address] [place] [address] [phone] Johanna Agent L. Gino, MD, PhD    Zinc  11/02/2023 CANCELED   Final   Comment: TEST NOT PERFORMED . The specimen submitted did not meet the specimen requirements. Please refer to the [employer] On-Line Test Directory or call Client Services for proper  requirements.  Result canceled by the ancillary.    Retic Ct Pct 11/02/2023 1.6  % Final   ABS Retic 11/02/2023  61,600  25,000 - 90,000 cells/uL Final   Extra tube recieved 11/02/2023    Final   Comment: An extra specimen was received with no test requested. The specimen will be maintained in storage in case  additional testing is needed. Please call the client service department for further assistance. SABRA    Specimen type recieved 11/02/2023 [place];   Final    Blood Alcohol level:  Lab Results  Component Value Date   ETH <15 04/18/2024   ETH 206 (H) 10/18/2023    Metabolic Disorder Labs: Lab Results  Component Value Date   HGBA1C 5.2 04/18/2024   MPG 102.54 04/18/2024   No results found for: PROLACTIN Lab Results  Component Value Date   CHOL 222 (H) 04/18/2024   TRIG 100 04/18/2024   HDL 88 04/18/2024   CHOLHDL 2.5 04/18/2024   VLDL 20 04/18/2024    LDLCALC 114 (H) 04/18/2024   LDLCALC 131 (H) 08/02/2023    Therapeutic Lab Levels: No results found for: LITHIUM No results found for: VALPROATE No results found for: CBMZ  Physical Findings   AUDIT    Flowsheet Row Office Visit from 07/07/2022 in [facility]  Alcohol Use Disorder Identification Test Final Score (AUDIT) 6   PHQ2-9    Flowsheet Row ED from 04/18/2024 in [facility] Office Visit from 08/02/2023 in [facility] Office Visit from 07/07/2022 in [facility] at [facility] Office Visit from 06/17/2021 in [facility] at [facility] Office Visit from 12/28/2020 in [facility]  PHQ-2 Total Score 2 0 0 1 0  PHQ-9 Total Score 5 -- 0 -- --   Flowsheet Row ED from 04/18/2024 in [facility] Most recent reading at 04/21/2024 12:00 AM ED from 04/18/2024 in [facility] Most recent reading at 04/18/2024 10:52 AM UC from 03/15/2024 in [facility]) Most recent reading at 03/15/2024  4:31 PM  C-SSRS RISK CATEGORY No Risk No Risk No Risk     Musculoskeletal  Strength & Muscle Tone: within normal limits Gait & Station: normal Patient leans: N/A Mental Status Exam:  Appearance: pt is resting and is appropriate for environment  Behavior:  calm and cooperative  Attitude: pleasant  Speech: nl r/r/v  Mood: good  Affect: constricted  Thought Process:   Thought Content: discusses wife's death  SI/HI:  denies  Perceptions: wnl  Judgment: Good  Insight: Good  Fund of Knowledge: WNL     Physical Exam  Physical Exam HENT:     Head: Normocephalic and atraumatic.  Eyes:     Conjunctiva/sclera: Conjunctivae normal.  Pulmonary:     Effort: Pulmonary effort is normal.  Musculoskeletal:     Cervical back: Normal range  of motion.  Neurological:     Mental Status: He is alert.    Review of Systems  Constitutional:  Negative for chills, fever, malaise/fatigue and weight loss.  HENT:  Negative for hearing loss.   Eyes:  Negative for blurred vision.  Respiratory:  Negative for cough.   Cardiovascular:  Negative for chest pain.  Gastrointestinal:  Negative for heartburn.  Genitourinary:  Negative for dysuria.  Musculoskeletal:  Negative for myalgias.  Skin:  Negative for rash.  Neurological:  Positive for tingling.  Psychiatric/Behavioral:  Positive for substance abuse. Negative for depression, hallucinations, memory loss and suicidal  ideas. The patient is not nervous/anxious and does not have insomnia.    Blood pressure 125/68, pulse 80, temperature 97.7 F (36.5 C), temperature source Oral, resp. rate 18, SpO2 99%. There is no height or weight on file to calculate BMI.  Treatment Plan Summary: Daily contact with patient to assess and evaluate symptoms and progress in treatment and Medication management Labs reviewed: CMP scheduled 04/21/2024.   Medications: -Continue maalox/mylanta 30 mL by mouth every 4 hours PRN for indigestion -Continue norvasc  5 mg by mouth daily for HTN -Continue folic acid  1 mg by mouth daily for deficiency -Continue hydroxyzine  25 mg tid by mouth PRN for anxiety -Continue advil  by mouth every 6 hours PRN for mild/moderate pain or headaches -Continue imodium  2-4 mg by mouth as needed for diarrhea or loose stools -Continue Ativan  1 mg by mouth every 6 hours PRN for CIWA >10 -Continue Ativan  taper to help prevent alcohol withdrawal related complications with end date of 04/20/2024 at 10 AM -Continue milk of magnesia 30 mL by mouth daily for mild constipation -Continue magnesium  oxide 400 mg by mouth daily for hypomagnesia  -Continue multivitamin with minerals tablet by mouth daily -Continue zofran  ODT 4 mg by mouth every 6 hours PRN for nausea/vomiting -Continue crestor  10  mg by mouth daily for hyperlipidemia -Continue thiamine  100 mg by mouth daily  -Continue trazodone  50 mg by mouth at bedtime PRN for sleep -Continue Vitamin D  50,000 units once a week for vitamin D  deficiency -Continue agitation protocol (see MAR) -Pt has a history of b12 def and formerly took B12 injections. He has neuropathy from no other known etiology. Give B12 injection.   Monitor safety and mood symptoms Supportive psychotherapy Unit milieu   Discharge Planning: Disposition/case management to assist with discharge planning and identification of follow-up needs including residential or outpatient substance use treatment options as needed, prior to discharge Discharge Concerns: Need to confirm safety plan; Medication compliance and effectiveness Discharge Goals: Return home with outpatient referrals for mental health follow-up including medication management/psychotherapy and substance use treatment follow-up resources as appropriate Garvin JINNY Gaines, MD 04/21/2024 9:13 PM

## 2024-04-21 NOTE — ED Notes (Signed)
 Patient alert and oriented.  Denies SI, HI, AVH, and pain. Scheduled medications administered to patient, per MD orders. Support and encouragement provided.  Routine safety checks conducted every 15 min.  Patient informed to notify staff with problems or concerns. No adverse drug reactions noted. Patient contracts for safety at this time. Patient compliant with medications and treatment plan. Patient receptive, calm, and cooperative. Patient interacts well with others on the unit.  Patient remains safe at this time.

## 2024-04-21 NOTE — ED Notes (Signed)
 Pt is calm and cooperative, polite answers questions asked appropriately.  Denies thoughts of self harm.  Maaintained on CIWA Protocol score = 4 .  Safety maintained.

## 2024-04-21 NOTE — Group Note (Signed)
 Group Topic: Healthy Self Image and Positive Change  Group Date: 04/21/2024 Start Time: 1300 End Time: 1400 Facilitators: Alyse Leilani LABOR, NT  Department: [facility]  Number of Participants: 7  Group Focus: community group, coping skills, daily focus, and healthy friendships Treatment Modality:  Skills Training Interventions utilized were group exercise Purpose: express feelings  Name: [name] Date of Birth: 07-Dec-1965  MR: [id number]    Pt did not come to group he was sleep  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   Hearing loss secondary to cerumen impaction, left 11/02/2023   Deficiency anemia 11/02/2023   Alcohol use disorder, moderate, dependence (HCC) 11/02/2023   PSA elevation 11/02/2023   Alcoholic hepatitis without ascites (HCC) 11/02/2023   Dietary folate deficiency anemia 11/02/2023   Vitamin B12 deficiency (dietary) anemia 11/02/2023   Non-recurrent bilateral inguinal hernia without obstruction or gangrene 07/07/2022   Encounter for general adult medical examination with abnormal findings 12/28/2020   Loud snoring 12/28/2020   Idiopathic chronic gout of multiple sites without tophus 12/28/2020   Elevated ferritin 02/17/2020   Essential hypertension 10/02/2019   Dyslipidemia, goal LDL below 100 10/02/2019   Hypersomnia with sleep apnea 06/09/2016   Excessive daytime sleepiness 06/09/2016   Parasomnia, 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

## 2024-04-21 NOTE — ED Notes (Signed)
 Patient denies pain and is resting comfortably.

## 2024-04-21 NOTE — ED Notes (Signed)
 Pt is resting in room during exchange of shift, appears to be sleeping without complaints of distress or discomfort.  Safety maintained.

## 2024-04-22 LAB — VITAMIN B1: Vitamin B1 (Thiamine): 98 nmol/L (ref 66.5–200.0)

## 2024-04-22 MED ORDER — GABAPENTIN 100 MG PO CAPS
200.0000 mg | ORAL_CAPSULE | Freq: Three times a day (TID) | ORAL | Status: DC
  Administered 2024-04-22 – 2024-04-23 (×3): 200 mg via ORAL
  Filled 2024-04-22 (×3): qty 2

## 2024-04-22 MED ORDER — LORATADINE 10 MG PO TABS
10.0000 mg | ORAL_TABLET | Freq: Every day | ORAL | Status: DC
  Administered 2024-04-22 – 2024-04-23 (×2): 10 mg via ORAL
  Filled 2024-04-22 (×2): qty 1

## 2024-04-22 NOTE — ED Notes (Signed)
 Pt is calm and pleasant on approach, reports feeling fine Ciwa is 3 related to tactile tingling of fingers. Pt is Medication compliant, reports Gabapentin  is working for him and offers no other concerns. Will continue to monitor and report changes as noted.

## 2024-04-22 NOTE — ED Notes (Signed)
 Pt observed at the beginning of the shift ambulating thru hallway, interacting with others appropriately affect.  Remains flat.  Safety maintained.

## 2024-04-22 NOTE — ED Notes (Signed)
 Pt generally cooperative, pleasant, and polite despite depressed affect. Pt ate lunch. Pt observed to be somewhat forgetful, RN having repeat information discussed earlier in the day.

## 2024-04-22 NOTE — ED Notes (Signed)
 Pt continues to to present with tremor, pt denies mental health distress saying, 'I'm straight'. Pt denies si hi avh, verbal contract for safety provided. Pt denies physical pain and discomforts aside from tremor. Pt at breakfast. No falls have occurred this shift thus far. Medications reviewed, questions denied.

## 2024-04-22 NOTE — ED Provider Notes (Addendum)
 Behavioral Health Progress Note:  Date and Time: 04/22/2024 12:00 PM Name: [name] MRN:  [id number]  History of Present illness: [name], 59 y.o., male  presented to [facility] voluntarily as a walk in, accompanied by mobile crisis, with complaints of wanting to detox from alcohol and some ongoing depressive symptoms in the context of losing his job and having some medical issues. ETTER Mcardle, Alan BROCKS, NP, Date of Service: 04/18/2024 12:40 PM).   Patient was admitted to the [facility] on same day ()1/22, but declined residential treatment, and was more receptive to outpatient services on same day. Patient reports benefiting from this [facility] admission, reports that his mood is currently stable; reports an improved sleep, improved appetite, denies any cravings for alcohol or other substances of abuse. Verbalizes motivation to abstain from substances of abuse after discharge so as to sustain his sobriety by following through with the discharge recommendations.  24 hr chart review: Vital signs earlier today morning were within normal limits, no concerns reported by nursing staff, patient noted to have slept overnight with no concerns, compliant with medications, not noted to have any behavioral disturbances over the course of his stay on the [facility].  Assessment 04/22/2024: On assessment today, the pt reports that their mood is euthymic, improved since admission, and stable. Denies feeling down, depressed, or sad.  Reports that anxiety symptoms are at manageable level.  Sleep is stable. Appetite is stable.  Concentration is without complaint.  Energy level is adequate. Denies having any suicidal thoughts. Denies having any suicidal intent and plan.  Denies having any HI.  Denies having psychotic symptoms. Denies AVH, denies paranoia and denies delusional thinking and there are no overt signs of psychosis.  Denies having side effects to current psychiatric  medications.   Discussed discharge planning for 04/24/2024 and a follow up with [facility] on 04/26/24 at 130 pm to which patient is receptive. He shares that he would like to be discharged so that he can go back to work.    Diagnosis:  Final diagnoses:  Alcohol use disorder  Adjustment disorder with depressed mood  B12 deficiency    Total Time spent with patient: 45 minutes  Past Psychiatric History: alcohol use disorder Past Medical History:  Past Medical History:  Diagnosis Date   Allergy    Arthritis    Compartment syndrome    right arm    GERD (gastroesophageal reflux disease)    History of kidney stones    Hypertension    self reported    Family History:  Family History  Problem Relation Age of Onset   Cataracts Mother    Dementia Mother    Hypertension Father    Kidney disease Father    Glaucoma Maternal Grandmother    Hypertension Paternal Grandfather    Other Brother        dialysis   Other Brother        dialysis   Colon cancer Neg Hx    Esophageal cancer Neg Hx    Rectal cancer Neg Hx    Stomach cancer Neg Hx     Family Psychiatric  History: denies  Social History:  Social History   Socioeconomic History   Marital status: Married    Spouse name: Jamee   Number of children: 2   Years of education: 12   Highest education level: 12th grade  Occupational History   Occupation: Production Designer, Theatre/television/film  Tobacco Use   Smoking status: Former  Types: Cigars    Quit date: 06/09/2012    Years since quitting: 11.8    Passive exposure: Past   Smokeless tobacco: Never   Tobacco comments:    occasional cigar  Vaping Use   Vaping status: Never Used  Substance and Sexual Activity   Alcohol use: Yes    Alcohol/week: 10.0 standard drinks of alcohol    Types: 10 Cans of beer per week    Comment: occasional   Drug use: No   Sexual activity: Yes    Partners: Female  Other Topics Concern   Not on file  Social History Narrative   Fun: [place]   Lives at home with  wife   Caffeine - coffee, one daily   Social Drivers of Health   Tobacco Use: Medium Risk (04/18/2024)   Patient History    Smoking Tobacco Use: Former    Smokeless Tobacco Use: Never    Passive Exposure: Past  Physicist, Medical Strain: Low Risk (07/06/2022)   Overall Financial Resource Strain (CARDIA)    Difficulty of Paying Living Expenses: Not very hard  Food Insecurity: No Food Insecurity (04/18/2024)   Epic    Worried About Radiation Protection Practitioner of Food in the Last Year: Never true    Ran Out of Food in the Last Year: Never true  Transportation Needs: No Transportation Needs (04/18/2024)   Epic    Lack of Transportation (Medical): No    Lack of Transportation (Non-Medical): No  Physical Activity: Insufficiently Active (07/06/2022)   Exercise Vital Sign    Days of Exercise per Week: 4 days    Minutes of Exercise per Session: 30 min  Stress: Stress Concern Present (07/06/2022)   [employer] of Occupational Health - Occupational Stress Questionnaire    Feeling of Stress : To some extent  Social Connections: Unknown (07/06/2022)   Social Connection and Isolation Panel    Frequency of Communication with Friends and Family: Three times a week    Frequency of Social Gatherings with Friends and Family: Twice a week    Attends Religious Services: Patient declined    Database Administrator or Organizations: No    Attends Engineer, Structural: Not on file    Marital Status: Widowed  Intimate Partner Violence: Not At Risk (04/18/2024)   Epic    Fear of Current or Ex-Partner: No    Emotionally Abused: No    Physically Abused: No    Sexually Abused: No  Depression (PHQ2-9): Medium Risk (04/22/2024)   Depression (PHQ2-9)    PHQ-2 Score: 5  Alcohol Screen: Low Risk (07/06/2022)   Alcohol Screen    Last Alcohol Screening Score (AUDIT): 6  Housing: Medium Risk (07/06/2022)   Housing    Last Housing Risk Score: 1  Utilities: Not At Risk (04/18/2024)   Epic    Threatened with loss of  utilities: No  Health Literacy: Not on file     Sleep: Fair  Appetite:  Fair  Current Medications:  Current Facility-Administered Medications  Medication Dose Route Frequency Provider Last Rate Last Admin   alum & mag hydroxide-simeth (MAALOX/MYLANTA) 200-200-20 MG/5ML suspension 30 mL  30 mL Oral Q4H PRN Brent, Amanda C, NP       amLODipine  (NORVASC ) tablet 5 mg  5 mg Oral Daily Brent, Amanda C, NP   5 mg at 04/22/24 9085   haloperidol  (HALDOL ) tablet 5 mg  5 mg Oral TID PRN Brent, Amanda C, NP       And  diphenhydrAMINE  (BENADRYL ) capsule 50 mg  50 mg Oral TID PRN Brent, Amanda C, NP       haloperidol  lactate (HALDOL ) injection 5 mg  5 mg Intramuscular TID PRN Brent, Amanda C, NP       And   diphenhydrAMINE  (BENADRYL ) injection 50 mg  50 mg Intramuscular TID PRN Brent, Amanda C, NP       And   LORazepam  (ATIVAN ) injection 2 mg  2 mg Intramuscular TID PRN Brent, Amanda C, NP       haloperidol  lactate (HALDOL ) injection 10 mg  10 mg Intramuscular TID PRN Thresa Alan BROCKS, NP       And   diphenhydrAMINE  (BENADRYL ) injection 50 mg  50 mg Intramuscular TID PRN Brent, Amanda C, NP       And   LORazepam  (ATIVAN ) injection 2 mg  2 mg Intramuscular TID PRN Brent, Amanda C, NP       folic acid  (FOLVITE ) tablet 1 mg  1 mg Oral Daily Brent, Amanda C, NP   1 mg at 04/22/24 9086   gabapentin  (NEURONTIN ) capsule 200 mg  200 mg Oral TID Tex Drilling, NP       hydrOXYzine  (ATARAX ) tablet 25 mg  25 mg Oral TID PRN Brent, Amanda C, NP   25 mg at 04/20/24 2119   ibuprofen  (ADVIL ) tablet 200 mg  200 mg Oral Q6H PRN Ramzan, Mariam, NP   200 mg at 04/21/24 2126   loratadine  (CLARITIN ) tablet 10 mg  10 mg Oral Daily Jaqwan Wieber, Drilling, NP       magnesium  hydroxide (MILK OF MAGNESIA) suspension 30 mL  30 mL Oral Daily PRN Brent, Amanda C, NP   30 mL at 04/21/24 1645   magnesium  oxide (MAG-OX) tablet 400 mg  400 mg Oral Daily Ramzan, Mariam, NP   400 mg at 04/22/24 9085   multivitamin with minerals tablet 1  tablet  1 tablet Oral Daily Brent, Amanda C, NP   1 tablet at 04/22/24 0913   rosuvastatin  (CRESTOR ) tablet 10 mg  10 mg Oral Daily Brent, Amanda C, NP   10 mg at 04/22/24 9085   thiamine  (VITAMIN B1) tablet 100 mg  100 mg Oral Daily Brent, Amanda C, NP   100 mg at 04/22/24 9085   traZODone  (DESYREL ) tablet 50 mg  50 mg Oral QHS PRN Brent, Amanda C, NP   50 mg at 04/21/24 2126   Vitamin D  (Ergocalciferol ) (DRISDOL ) 1.25 MG (50000 UNIT) capsule 50,000 Units  50,000 Units Oral Q7 days Ramzan, Mariam, NP   50,000 Units at 04/19/24 1618   Current Outpatient Medications  Medication Sig Dispense Refill   amLODipine  (NORVASC ) 5 MG tablet Take 1 tablet (5 mg total) by mouth daily. 90 tablet 0   Cyanocobalamin  (VITAMIN B-12 PO) Take 1 tablet by mouth daily.     folic acid  (FOLVITE ) 1 MG tablet Take 1 tablet (1 mg total) by mouth daily. 90 tablet 1   MAGNESIUM  PO Take 1 tablet by mouth daily.     oxymetazoline (AFRIN) 0.05 % nasal spray Place 1 spray into both nostrils 2 (two) times daily as needed for congestion.     rosuvastatin  (CRESTOR ) 10 MG tablet Take 1 tablet (10 mg total) by mouth daily. 90 tablet 0   thiamine  (VITAMIN B1) 100 MG tablet Take 1 tablet (100 mg total) by mouth daily. 90 tablet 0    Labs  Lab Results:  Admission on 04/18/2024  Component Date Value Ref Range Status  Opiates 04/19/2024 NEGATIVE  NEGATIVE Final   Cocaine 04/19/2024 NEGATIVE  NEGATIVE Final   Benzodiazepines 04/19/2024 POSITIVE (A)  NEGATIVE Final   Amphetamines 04/19/2024 NEGATIVE  NEGATIVE Final   Tetrahydrocannabinol 04/19/2024 NEGATIVE  NEGATIVE Final   Barbiturates 04/19/2024 NEGATIVE  NEGATIVE Final   Methadone Scn, Ur 04/19/2024 NEGATIVE  NEGATIVE Final   Fentanyl  04/19/2024 NEGATIVE  NEGATIVE Final   Comment: (NOTE) Drug screen is for Medical Purposes only. Positive results are preliminary only. If confirmation is needed, notify lab within 5 days.  Drug Class                 Cutoff  (ng/mL) Amphetamine and metabolites 1000 Barbiturate and metabolites 200 Benzodiazepine              200 Opiates and metabolites     300 Cocaine and metabolites     300 THC                         50 Fentanyl                     5 Methadone                   300  Trazodone  is metabolized in vivo to several metabolites,  including pharmacologically active m-CPP, which is excreted in the  urine.  Immunoassay screens for amphetamines and MDMA have potential  cross-reactivity with these compounds and may provide false positive  result.  Performed at [facility], [address] [place], [place] [address]    Vit D, 25-Hydroxy 04/18/2024 10.5 (L)  30 - 100 ng/mL Final   Comment: (NOTE) Vitamin D  deficiency has been defined by the Institute of Medicine  and an Endocrine Society practice guideline as a level of serum 25-OH  vitamin D  less than 20 ng/mL (1,2). The Endocrine Society went on to  further define vitamin D  insufficiency as a level between 21 and 29  ng/mL (2).  1. IOM (Institute of Medicine). 2010. Dietary reference intakes for  calcium  and D. [place]: The [employer]. 2. Holick MF, Binkley Tichigan, Bischoff-Ferrari HA, et al. Evaluation,  treatment, and prevention of vitamin D  deficiency: an Endocrine  Society clinical practice guideline, JCEM. 2011 Jul; 96(7): 1911-30.  Performed at [facility], [address] [place], [place] [address]    Ferritin 04/21/2024 809 (H)  24 - 336 ng/mL Final   Performed at [facility], [address] [place], [place] [address]   Iron 04/21/2024 38 (L)  45 - 182 ug/dL Final   TIBC 98/74/7973 316  250 - 450 ug/dL Final   Saturation Ratios 04/21/2024 12 (L)  17.9 - 39.5 % Final   UIBC 04/21/2024 279  ug/dL Final   Performed at [facility], [address], [place], [place] [address]   Sodium 04/21/2024 135  135 - 145 mmol/L Final   Potassium 04/21/2024 4.6  3.5 - 5.1 mmol/L Final   Chloride  04/21/2024 98  98 - 111 mmol/L Final   CO2 04/21/2024 27  22 - 32 mmol/L Final   Glucose, Bld 04/21/2024 116 (H)  70 - 99 mg/dL Final   Glucose reference range applies only to samples taken after fasting for at least 8 hours.   BUN 04/21/2024 11  6 - 20 mg/dL Final   Creatinine, Ser 04/21/2024 1.03  0.61 - 1.24 mg/dL Final   Calcium   04/21/2024 10.0  8.9 - 10.3 mg/dL Final   Total Protein 98/74/7973 7.4  6.5 - 8.1 g/dL Final   Albumin 98/74/7973 4.2  3.5 - 5.0 g/dL Final   AST 98/74/7973 140 (H)  15 - 41 U/L Final   ALT 04/21/2024 91 (H)  0 - 44 U/L Final   Alkaline Phosphatase 04/21/2024 75  38 - 126 U/L Final   Total Bilirubin 04/21/2024 0.4  0.0 - 1.2 mg/dL Final   GFR, Estimated 04/21/2024 >60  >60 mL/min Final   Comment: (NOTE) Calculated using the CKD-EPI Creatinine Equation (2021)    Anion gap 04/21/2024 11  5 - 15 Final   Performed at [facility], [address] [place], [place] [address]   Folate 04/21/2024 15.7  >5.9 ng/mL Final   Performed at [facility], [address], [place], [place] [address]  Admission on 04/18/2024, Discharged on 04/18/2024  Component Date Value Ref Range Status   WBC 04/18/2024 3.9 (L)  4.0 - 10.5 K/uL Final   RBC 04/18/2024 4.01 (L)  4.22 - 5.81 MIL/uL Final   Hemoglobin 04/18/2024 12.0 (L)  13.0 - 17.0 g/dL Final   HCT 98/77/7973 36.1 (L)  39.0 - 52.0 % Final   MCV 04/18/2024 90.0  80.0 - 100.0 fL Final   MCH 04/18/2024 29.9  26.0 - 34.0 pg Final   MCHC 04/18/2024 33.2  30.0 - 36.0 g/dL Final   RDW 98/77/7973 16.2 (H)  11.5 - 15.5 % Final   Platelets 04/18/2024 183  150 - 400 K/uL Final   nRBC 04/18/2024 0.0  0.0 - 0.2 % Final   Neutrophils Relative % 04/18/2024 71  % Final   Neutro Abs 04/18/2024 2.7  1.7 - 7.7 K/uL Final   Lymphocytes Relative 04/18/2024 15  % Final   Lymphs Abs 04/18/2024 0.6 (L)  0.7 - 4.0 K/uL Final   Monocytes Relative 04/18/2024 13  % Final   Monocytes Absolute 04/18/2024 0.5  0.1 - 1.0 K/uL Final    Eosinophils Relative 04/18/2024 1  % Final   Eosinophils Absolute 04/18/2024 0.0  0.0 - 0.5 K/uL Final   Basophils Relative 04/18/2024 0  % Final   Basophils Absolute 04/18/2024 0.0  0.0 - 0.1 K/uL Final   Immature Granulocytes 04/18/2024 0  % Final   Abs Immature Granulocytes 04/18/2024 0.01  0.00 - 0.07 K/uL Final   Performed at [facility], [address] [place], [place] [address]   Sodium 04/18/2024 136  135 - 145 mmol/L Final   Potassium 04/18/2024 3.7  3.5 - 5.1 mmol/L Final   Chloride 04/18/2024 96 (L)  98 - 111 mmol/L Final   CO2 04/18/2024 27  22 - 32 mmol/L Final   Glucose, Bld 04/18/2024 145 (H)  70 - 99 mg/dL Final   Glucose reference range applies only to samples taken after fasting for at least 8 hours.   BUN 04/18/2024 8  6 - 20 mg/dL Final   Creatinine, Ser 04/18/2024 0.69  0.61 - 1.24 mg/dL Final   Calcium  04/18/2024 9.6  8.9 - 10.3 mg/dL Final   Total Protein 98/77/7973 7.4  6.5 - 8.1 g/dL Final   Albumin 98/77/7973 4.5  3.5 - 5.0 g/dL Final   AST 98/77/7973 110 (H)  15 - 41 U/L Final   ALT 04/18/2024 55 (H)  0 - 44 U/L Final   Alkaline Phosphatase 04/18/2024 89  38 - 126 U/L Final   Total Bilirubin 04/18/2024 0.8  0.0 - 1.2 mg/dL Final   GFR, Estimated 04/18/2024 >60  >60 mL/min Final   Comment: (NOTE) Calculated using the CKD-EPI Creatinine Equation (2021)    Anion gap 04/18/2024 13  5 - 15 Final   Performed at [facility], [address] [place], [place] [address]   Hgb A1c MFr Bld 04/18/2024 5.2  4.8 - 5.6 % Final   Comment: (NOTE) Diagnosis of Diabetes The following HbA1c ranges recommended by the American Diabetes Association (ADA) may be used as an aid in the diagnosis of diabetes mellitus.  Hemoglobin             Suggested A1C NGSP%              Diagnosis  <5.7                   Non Diabetic  5.7-6.4                Pre-Diabetic  >6.4                   Diabetic  <7.0                   Glycemic control for                        adults with diabetes.     Mean Plasma Glucose 04/18/2024 102.54  mg/dL Final   Performed at [facility], [address], [place], [place] [address]   Magnesium  04/18/2024 1.5 (L)  1.7 - 2.4 mg/dL Final   Performed at [facility], [address] [place], [place] [address]   Alcohol, Ethyl (B) 04/18/2024 <15  <15 mg/dL Final   Comment: (NOTE) For medical purposes only. Performed at [facility], [address], [place], [place] [address]    Vitamin B-12 04/18/2024 722  180 - 914 pg/mL Final   Performed at [facility], [address] [place], [place] [address]   Cholesterol 04/18/2024 222 (H)  0 - 200 mg/dL Final   Comment:        ATP III CLASSIFICATION:  <200     mg/dL   Desirable  799-760  mg/dL   Borderline High  >=759    mg/dL   High           Triglycerides 04/18/2024 100  <150 mg/dL Final   HDL 98/77/7973 88  >40 mg/dL Final   Total CHOL/HDL Ratio 04/18/2024 2.5  RATIO Final   VLDL 04/18/2024 20  0 - 40 mg/dL Final   LDL Cholesterol 04/18/2024 114 (H)  0 - 99 mg/dL Final   Comment:        Total Cholesterol/HDL:CHD Risk Coronary Heart Disease Risk Table                     Men   Women  1/2 Average Risk   3.4   3.3  Average Risk       5.0   4.4  2 X Average Risk   9.6   7.1  3 X Average Risk  23.4   11.0        Use the calculated Patient Ratio above and the CHD Risk Table to determine the patient's CHD Risk.        ATP III CLASSIFICATION (LDL):  <100     mg/dL   Optimal  899-870  mg/dL  Near or Above                    Optimal  130-159  mg/dL   Borderline  839-810  mg/dL   High  >809     mg/dL   Very High Performed at [facility], [address] [place], [place] [address]    TSH 04/18/2024 1.190  0.350 - 4.500 uIU/mL Final   Performed at [facility], [address], [place], [place] [address]  Admission on 12/26/2023, Discharged on 12/26/2023  Component Date Value Ref Range Status   Neisseria Gonorrhea 12/26/2023  Negative   Final   Chlamydia 12/26/2023 Negative   Final   Trichomonas 12/26/2023 Negative   Final   Comment 12/26/2023 Normal Reference Ranger Chlamydia - Negative   Final   Comment 12/26/2023 Normal Reference Range Neisseria Gonorrhea - Negative   Final   Comment 12/26/2023 Normal Reference Range Trichomonas - Negative   Final  Office Visit on 11/02/2023  Component Date Value Ref Range Status   Uric Acid, Serum 11/02/2023 6.2  4.0 - 7.8 mg/dL Final   Sodium 91/92/7974 140  135 - 145 mEq/L Final   Potassium 11/02/2023 3.9  3.5 - 5.1 mEq/L Final   Chloride 11/02/2023 98  96 - 112 mEq/L Final   CO2 11/02/2023 25  19 - 32 mEq/L Final   Glucose, Bld 11/02/2023 91  70 - 99 mg/dL Final   BUN 91/92/7974 7  6 - 23 mg/dL Final   Creatinine, Ser 11/02/2023 0.63  0.40 - 1.50 mg/dL Final   GFR 91/92/7974 103.60  >60.00 mL/min Final   Calculated using the CKD-EPI Creatinine Equation (2021)   Calcium  11/02/2023 9.5  8.4 - 10.5 mg/dL Final   Total Bilirubin 11/02/2023 0.6  0.2 - 1.2 mg/dL Final   Bilirubin, Direct 11/02/2023 0.2  0.0 - 0.3 mg/dL Final   Alkaline Phosphatase 11/02/2023 58  39 - 117 U/L Final   AST 11/02/2023 51 (H)  0 - 37 U/L Final   ALT 11/02/2023 31  0 - 53 U/L Final   Total Protein 11/02/2023 7.4  6.0 - 8.3 g/dL Final   Albumin 91/92/7974 4.4  3.5 - 5.2 g/dL Final   INR 91/92/7974 1.4 (H)  0.8 - 1.0 ratio Final   Prothrombin Time 11/02/2023 15.1 (H)  9.6 - 13.1 sec Final   PSA 11/02/2023 7.29 (H)  0.10 - 4.00 ng/mL Final   Test performed using Access [employer] PSA Assay, a parmagnetic partical, chemiluminecent immunoassay.   Iron 11/02/2023 73  42 - 165 ug/dL Final   Transferrin 91/92/7974 232.0  212.0 - 360.0 mg/dL Final   Saturation Ratios 11/02/2023 22.5  20.0 - 50.0 % Final   Ferritin 11/02/2023 290.5  22.0 - 322.0 ng/mL Final   TIBC 11/02/2023 324.8  250.0 - 450.0 mcg/dL Final   Folate 91/92/7974 3.5 (L)  >5.9 ng/mL Final   WBC 11/02/2023 4.5  4.0 - 10.5 K/uL Final    RBC 11/02/2023 3.92 (L)  4.22 - 5.81 Mil/uL Final   Hemoglobin 11/02/2023 11.5 (L)  13.0 - 17.0 g/dL Final   HCT 91/92/7974 34.9 (L)  39.0 - 52.0 % Final   MCV 11/02/2023 89.0  78.0 - 100.0 fl Final   MCHC 11/02/2023 32.9  30.0 - 36.0 g/dL Final   RDW 91/92/7974 15.4  11.5 - 15.5 % Final   Platelets 11/02/2023 249.0  150.0 - 400.0 K/uL Final   Neutrophils Relative % 11/02/2023 69.3  43.0 - 77.0 % Final   Lymphocytes Relative 11/02/2023 15.9  12.0 - 46.0 % Final   Monocytes Relative 11/02/2023 12.5 (H)  3.0 - 12.0 % Final   Eosinophils Relative 11/02/2023 1.4  0.0 - 5.0 % Final   Basophils Relative 11/02/2023 0.9  0.0 - 3.0 % Final   Neutro Abs 11/02/2023 3.1  1.4 - 7.7 K/uL Final   Lymphs Abs 11/02/2023 0.7  0.7 - 4.0 K/uL Final   Monocytes Absolute 11/02/2023 0.6  0.1 - 1.0 K/uL Final   Eosinophils Absolute 11/02/2023 0.1  0.0 - 0.7 K/uL Final   Basophils Absolute 11/02/2023 0.0  0.0 - 0.1 K/uL Final   Vitamin B1 (Thiamine ) 11/02/2023 <6 (L)  8 - 30 nmol/L Final   Comment: (Note) Vitamin supplementation within 24 hours prior to blood  draw may affect the accuracy of the results. . This test was developed and its analytical performance  characteristics have been determined by [employer]. It has not been cleared or approved by FDA.  This assay has been validated pursuant to the CLIA  regulations and is used for clinical purposes. . [facility] [address] [place] [address] [phone] Johanna Agent L. Gino, MD, PhD    Zinc  11/02/2023 CANCELED   Final   Comment: TEST NOT PERFORMED . The specimen submitted did not meet the specimen requirements. Please refer to the [employer] On-Line Test Directory or call Client Services for proper  requirements.  Result canceled by the ancillary.    Retic Ct Pct 11/02/2023 1.6  % Final   ABS Retic 11/02/2023 61,600  25,000 - 90,000 cells/uL Final   Extra tube recieved 11/02/2023    Final    Comment: An extra specimen was received with no test requested. The specimen will be maintained in storage in case  additional testing is needed. Please call the client service department for further assistance. SABRA    Specimen type recieved 11/02/2023 [place]-[facility];   Final    Blood Alcohol level:  Lab Results  Component Value Date   ETH <15 04/18/2024   ETH 206 (H) 10/18/2023    Metabolic Disorder Labs: Lab Results  Component Value Date   HGBA1C 5.2 04/18/2024   MPG 102.54 04/18/2024   No results found for: PROLACTIN Lab Results  Component Value Date   CHOL 222 (H) 04/18/2024   TRIG 100 04/18/2024   HDL 88 04/18/2024   CHOLHDL 2.5 04/18/2024   VLDL 20 04/18/2024   LDLCALC 114 (H) 04/18/2024   LDLCALC 131 (H) 08/02/2023    Therapeutic Lab Levels: No results found for: LITHIUM No results found for: VALPROATE No results found for: CBMZ  Physical Findings   AUDIT    Flowsheet Row Office Visit from 07/07/2022 in [facility]  Alcohol Use Disorder Identification Test Final Score (AUDIT) 6   PHQ2-9    Flowsheet Row ED from 04/18/2024 in [facility] Office Visit from 08/02/2023 in [facility] [place] Office Visit from 07/07/2022 in [facility] [place] Office Visit from 06/17/2021 in [facility] [place] Office Visit from 12/28/2020 in [facility]  PHQ-2 Total Score 2 0 0 1 0  PHQ-9 Total Score 5 -- 0 -- --   Flowsheet Row ED from 04/18/2024 in [facility] Most recent reading at 04/21/2024 12:00 AM ED  from 04/18/2024 in [facility] Most recent reading at 04/18/2024 10:52 AM UC from 03/15/2024 in [facility]) Most recent reading at 03/15/2024  4:31 PM  C-SSRS RISK CATEGORY No Risk No Risk No  Risk     Musculoskeletal  Strength & Muscle Tone: within normal limits Gait & Station: normal Patient leans: N/A  Psychiatric Specialty Exam  Presentation  General Appearance:  Appropriate for Environment; Casual  Eye Contact: Fair  Speech: Clear and Coherent  Speech Volume: Normal  Handedness: Right   Mood and Affect  Mood: Euthymic  Affect: Congruent   Thought Process  Thought Processes: Coherent  Descriptions of Associations:Intact  Orientation:Full (Time, Place and Person)  Thought Content:Logical  Diagnosis of Schizophrenia or Schizoaffective disorder in past: No    Hallucinations:Hallucinations: None  Ideas of Reference:None  Suicidal Thoughts:Suicidal Thoughts: No  Homicidal Thoughts:Homicidal Thoughts: No   Sensorium  Memory: Immediate Fair  Judgment: Fair  Insight: Fair   Art Therapist  Concentration: Fair  Attention Span: Fair  Recall: Fair  Fund of Knowledge: Fair  Language: Fair   Psychomotor Activity  Psychomotor Activity:Psychomotor Activity: Normal   Assets  Assets: Communication Skills; Resilience   Sleep  Sleep:Sleep: Good  Estimated Sleeping Duration (Last 24 Hours): 15.50-16.50 hours  Nutritional Assessment (For OBS and FBC admissions only) Has the patient had a weight loss or gain of 10 pounds or more in the last 3 months?: No Has the patient had a decrease in food intake/or appetite?: No Does the patient have dental problems?: No Does the patient have eating habits or behaviors that may be indicators of an eating disorder including binging or inducing vomiting?: No Has the patient recently lost weight without trying?: 0 Has the patient been eating poorly because of a decreased appetite?: 0 Malnutrition Screening Tool Score: 0    Physical Exam  Physical Exam Vitals and nursing note reviewed.  Neurological:     Mental Status: He is oriented to person, place, and time.    Review  of Systems  Psychiatric/Behavioral:  Positive for depression and substance abuse. Negative for hallucinations, memory loss and suicidal ideas. The patient is nervous/anxious and has insomnia.   All other systems reviewed and are negative.  Blood pressure 116/64, pulse 75, temperature 98.6 F (37 C), temperature source Oral, resp. rate 16, SpO2 100%. There is no height or weight on file to calculate BMI.  Recommendations: Follow-up recommendations:Will send Prescriptions sen to patient's preferred pharmacy at discharge. Patient was agreeable to his discharge plan with CSW as listed below. Pt was given the opportunity to ask questions, and all were answered to his satisfaction.  Pt verbalized feeling comfortable with discharge denied any suicidal or homicidal thought prior to discharge. Patient was instructed prior to discharge to: Take all medications as prescribed by his mental healthcare provider. Report any adverse effects and or reactions from the medicines to his outpatient provider promptly. Patient was instructed & cautioned: To not engage in alcohol and or illegal drug use while on prescription medicines. He was educated that in the event of worsening symptoms, he is to call the mobile crisis hotline [phone], National Suicide prevention Lifeline 249-115-3894, 911 and/or go to the nearest ED for appropriate evaluation and treatment of symptoms. Pt was educated, and was agreeable to follow-up with his primary care provider for your other medical issues, concerns and or health care needs.       Discharge Instructions  FBC Care Management...  Patient declined residential treatment, requested out patient resources  Patient will discharge to home by 11:00 am...  [address]  [place] [address]    RN to arrange transportation  Programme Researcher, Broadcasting/film/video coordinated an appointment for Friday 04/26/2024 @ 8:30 am with ...  [facility] [address] [place],  [place] [address] [phone] IF NO ANSWER PLEASE LEAVE MESSAGE WITH GOOD CONTACT NUMBER   For evening SAIOP classes  [facility] [address], [place], [place] [address] Phone: [phone]   Community Resource Guide Outpatient Counseling/Substance Abuse Adult [employer] is a great source of information about community services available.  Access by dialing 2-1-1 from anywhere in [place] , or by website -  pooledincome.pl.   Other Local Resources (Updated 01/2016)  Outpatient Counseling/ Substance Abuse Programs  Services     Address and Phone Number  ADS ([facility] and Drug Services)  Options include Individual counseling, group counseling, intensive outpatient program (several hours a day, several days a week) Offers depression assessments Provides methadone maintenance program [phone] [address] [place], [place] [address]   [facility]  Offers partial hospitalization/day treatment and DUI/DWI programs [employer], private insurance [phone] [address], [address] [place], [place] [address]  [facility] [place], [place], private insurance [phone] [address] [place], [place]  [facility]   Services include intensive outpatient program (several hours a day, several days a week), outpatient treatment, DUI/DWI services, family education Also has some services specifically for [employer] transitional housing  [phone] [address] [place], [place] [address]     [facility] Accepts Medicare, private pay, and private insurance [phone] [address] [place], [place] [address]  [employer] of [facility] Services include individual counseling, substance abuse intensive outpatient program (several hours a day, several days a week), day treatment Florinda Many, Medicaid, private insurance [phone] [address], [address] [place], [place] [address]  [facility]  Offers substance abuse intensive outpatient program (several hours a day, several days a week), partial hospitalization program [phone] [address] [place], [place] [address]  [phone] [address] [place], [place] [address]  [phone] [address] [place], [place] [address]  [phone], [address] [place], [place] [address]  [facility] Individual counseling only Accepts private insurance only [phone] [address], [address] [place], [place] [address]  [facility] Methadone maintenance program [phone] [address] [place], [place] [address]  [facility] providing substance abuse and mental health counseling Accepts Medicaid, Medicare, private insurance Offers sliding scale for uninsured [phone] [address] [place], [place]   [facility] [place], [employer]. Offers individual counseling, and intensive in-home services [phone] [address], [address] [place], [place] [address]  [facility] individual counseling, family counseling, group therapy, domestic violence counseling, consumer credit counseling Accepts Medicare, Medicaid, private insurance Offers sliding scale for uninsured [phone] [address] [place], [place] [address]  [phone] [facility], [address] [place], [place] [address]  [facility] Offers individual, family and group counseling 3 locations - [place], [place], and [place]  [phone]  [address] [place], [place] [address]  [address] [place], [place] [address]  [address] [place], [place] [address]  [facility]   Offers psychiatric assessment, 8-week Intensive Outpatient Program (several hours  a day, several times a week, daytime or evenings), early recovery group, family Program,  medication management Private pay or private insurance only [phone], or  [phone] [address] [place], [place] [address]  [facility] Offers individual, couples and family counseling Accepts Medicaid, private insurance, and sliding scale for uninsured [phone] [address] [place], [place] [address]  Alm Riding, MD Individual counseling Private insurance [phone] [address] [place], [place] [address]  [facility]  Offers assessment, substance abuse treatment, and behavioral health treatment [phone] [address] [place], [place] [address]  [facility] Individual counseling Accepts private insurance [phone] [address] [place], [place] [address]  [facility] Individual counseling Florinda Many, private insurance [phone] [address] [place], [place] [address]  [facility]   Serves children and adolescents ages 48 to 21 years of age Offers 90-day or longer intensive outpatient programs (3 hours a day, 3 days a week) Cost is $5000/month. Facility is out of network with all insurance companies. Accepts private pay. Does not accept Medicaid.   Offers scholarships and financial assistance [phone] [facility] [place], [place]  [facility] Individual counseling Medicare, private insurance [phone] [address] [place], [place] [address]  [facility]   Offers intensive outpatient program (several hours a day, several times a week) and partial hospitalization program [phone] [address] [place], [place] [address]  Elodie Anon, MD Individual counseling [phone] [address] [place], [place] [address]  [facility] Offers Christian counseling to individuals, couples, and families Accepts Medicare and private  insurance; offers sliding scale for uninsured [phone] [address] [place], [place] [address]  [facility] [phone] [address] [place], [place] [address]  [facility] [employer] crisis counseling, individual counseling, group therapy, in-home therapy, domestic violence services, day treatment, DWI services, Administrator, Arts (C[facility]), Doctor, [facility] ([facility]), substance abuse Intensive Outpatient Program (several hours a day, several times a week) 2 locations - [place] and [place] [phone] [address] [place], [place] [address]  [phone] [address] [place], [place] [address]  [facility]    Individual counseling and group therapy Accepts private insurance, [employer], [place] [phone] [address] [place], [place]  [facility] Offers individual and family counseling Offers LGBTQ services Accepts private insurance and private pay [phone] [address] [place], [place] [address]  [facility] Full service practice for adult and pediatric patients Monday-Friday; 8:00a - 5:00p [phone] (Multiple locations)  [facility] [employer] individual counseling, group therapy, and outpatient detox Accepts private insurance [phone] [address] [place], [place]  [facility] Individual counseling Accepts Medicare, private insurance [phone] [address] [place], [place] [address]  [employer] Individual counseling Accepts Medicare, private insurance [phone] [address] [place], [place] [address]  [facility]  Offers substance abuse Intensive Outpatient Program (several hours a day, several times a week) [phone], or [phone] [place], [place]        Treatment Plan Summary:  Safety and Monitoring: Voluntary  admission to inpatient psychiatric unit for safety, stabilization and treatment Daily contact with patient to assess and evaluate symptoms and progress in treatment Patient's case to be discussed in multi-disciplinary team meeting Observation Level : q15 minute checks Vital signs: q12 hours Precautions: Safety  Long Term Goal(s):  Improvement in symptoms so as ready for discharge  Short Term Goals: Ability to identify changes in lifestyle to reduce recurrence of condition will improve, Ability to verbalize feelings will improve, Ability to disclose and discuss suicidal ideas, Ability to demonstrate self-control will improve, Ability to identify and develop effective coping behaviors will improve, Ability to maintain clinical measurements within normal limits will improve, Compliance with prescribed medications will improve, and Ability to identify triggers associated with substance abuse/mental health issues will improve  Medications: Adjustment for today: Claritin  10 mg daily for rhinorrhea. Increase Gabapentin  to 200 mg TID to help with neuropathy.  amLODipine   5 mg Oral Daily   folic acid   1 mg Oral Daily   gabapentin   200 mg Oral TID   loratadine   10 mg Oral Daily   magnesium  oxide  400 mg Oral Daily   multivitamin with minerals  1 tablet Oral Daily   rosuvastatin   10 mg Oral Daily   thiamine   100 mg Oral Daily   Vitamin D  (Ergocalciferol )  50,000 Units Oral Q7 days   Labs Reviewed: No new orders  PRNS:  alum & mag hydroxide-simeth, haloperidol  **AND** diphenhydrAMINE , haloperidol  lactate **AND** diphenhydrAMINE  **AND** LORazepam , haloperidol  lactate **AND** diphenhydrAMINE  **AND** LORazepam , hydrOXYzine , ibuprofen , magnesium  hydroxide, traZODone    Discharge Planning: Social work and case management to assist with discharge planning and identification of hospital follow-up needs prior to discharge Estimated LOS: 3-5 days Discharge Concerns: Need to establish a safety plan; Medication  compliance and effectiveness Discharge Goals: Return home with outpatient referrals for mental health follow-up including medication management/psychotherapy  I certify that inpatient services furnished can reasonably be expected to improve the patient's condition.   Donia Snell, NP 04/22/2024 12:00 PM

## 2024-04-22 NOTE — Group Note (Signed)
 Group Topic: Recovery Basics  Group Date: 04/22/2024 Start Time: 1300 End Time: 1400 Facilitators: Nicholaus Arlyne BIRCH, NT  Department: [facility]  Number of Participants: 4  Group Focus: daily focus Treatment Modality:  Psychoeducation Interventions utilized were exploration Purpose: increase insight  Name: [name] Date of Birth: [birth year]  MR: [id number]    Level of Participation: active Quality of Participation: attentive Interactions with others: gave feedback Mood/Affect: appropriate Triggers (if applicable): None Cognition: open minded Progress: Gaining insight Response: good Plan: patient will be encouraged to continue recovery process  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   Hearing loss secondary to cerumen impaction, left 11/02/2023   Deficiency anemia 11/02/2023   Alcohol use disorder, moderate, dependence (HCC) 11/02/2023   PSA elevation 11/02/2023   Alcoholic hepatitis without ascites (HCC) 11/02/2023   Dietary folate deficiency anemia 11/02/2023   Vitamin B12 deficiency (dietary) anemia 11/02/2023   Non-recurrent bilateral inguinal hernia without obstruction or gangrene 07/07/2022   Encounter for general adult medical examination with abnormal findings 12/28/2020   Loud snoring 12/28/2020   Idiopathic chronic gout of multiple sites without tophus 12/28/2020   Elevated ferritin 02/17/2020   Essential hypertension 10/02/2019   Dyslipidemia, goal LDL below 100 10/02/2019   Hypersomnia with sleep apnea 06/09/2016   Excessive daytime sleepiness 06/09/2016   Parasomnia, 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

## 2024-04-22 NOTE — ED Notes (Signed)
 Pt slept throughout the night without issue. Pt observed breathing easy and unlabored during safety rounds. Will continue to monitor and report changes as noted. Safety maintained.

## 2024-04-22 NOTE — Care Management (Addendum)
 FBC Care Management...   Patient declined inpatient residential treatment  Patient requested out patient services  Patient will discharge 04/23/24 to home by 11:00 am...  [address]  [place] [address]    RN to arrange transportation  Programme Researcher, Broadcasting/film/video coordinated an CD-IOP appointment for 04/26/24 @ 8:30 am with ...  [facility] [address] [place], [place] [address] [phone]  IF NO ANSWER PLEASE LEAVE MESSAGE WITH GOOD CONTACT NUMBER   Writer also provided lists of other out patient resources

## 2024-04-22 NOTE — Group Note (Signed)
 Group Topic: Understanding Self  Group Date: 04/22/2024 Start Time: 1210 End Time: 1230 Facilitators: Daved Tinnie HERO, RN  Department: [facility]  Number of Participants: 5  Group Focus: nursing group Treatment Modality:  Psychoeducation Interventions utilized were patient education Purpose: increase insight  Name: [name] Date of Birth: [birth year]  MR: [id number]    Level of Participation: moderate Quality of Participation: cooperative Interactions with others: gave feedback Mood/Affect: appropriate Triggers (if applicable): n/a Cognition: coherent/clear Progress: Gaining insight Response: pt reports they will prepare in advance and avoid alcohol that changes his emotions , to decrease avoidance of difficult things Plan: patient will be encouraged to attend future nursing groups  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   Hearing loss secondary to cerumen impaction, left 11/02/2023   Deficiency anemia 11/02/2023   Alcohol use disorder, moderate, dependence (HCC) 11/02/2023   PSA elevation 11/02/2023   Alcoholic hepatitis without ascites (HCC) 11/02/2023   Dietary folate deficiency anemia 11/02/2023   Vitamin B12 deficiency (dietary) anemia 11/02/2023   Non-recurrent bilateral inguinal hernia without obstruction or gangrene 07/07/2022   Encounter for general adult medical examination with abnormal findings 12/28/2020   Loud snoring 12/28/2020   Idiopathic chronic gout of multiple sites without tophus 12/28/2020   Elevated ferritin 02/17/2020   Essential hypertension 10/02/2019   Dyslipidemia, goal LDL below 100 10/02/2019   Hypersomnia with sleep apnea 06/09/2016   Excessive daytime sleepiness 06/09/2016   Parasomnia, 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   Nightmares REM-sleep type 06/09/2016   Erectile dysfunction due to diseases  classified elsewhere 04/29/2016   Overweight (BMI 25.0-29.9) 11/17/2015

## 2024-04-23 ENCOUNTER — Other Ambulatory Visit: Payer: Self-pay | Admitting: Internal Medicine

## 2024-04-23 DIAGNOSIS — I10 Essential (primary) hypertension: Secondary | ICD-10-CM

## 2024-04-23 MED ORDER — VITAMIN B-1 100 MG PO TABS: 100.0000 mg | ORAL_TABLET | Freq: Every day | ORAL | 0 refills | Status: AC

## 2024-04-23 MED ORDER — GABAPENTIN 100 MG PO CAPS: 200.0000 mg | ORAL_CAPSULE | Freq: Three times a day (TID) | ORAL | 0 refills | Status: AC

## 2024-04-23 MED ORDER — ADULT MULTIVITAMIN W/MINERALS CH: 1.0000 | ORAL_TABLET | Freq: Every day | ORAL | 0 refills | Status: AC

## 2024-04-23 MED ORDER — AMLODIPINE BESYLATE 5 MG PO TABS: 5.0000 mg | ORAL_TABLET | Freq: Every day | ORAL | 0 refills | Status: AC

## 2024-04-23 MED ORDER — VITAMIN D (ERGOCALCIFEROL) 1.25 MG (50000 UNIT) PO CAPS: 50000.0000 [IU] | ORAL_CAPSULE | ORAL | 0 refills | Status: AC

## 2024-04-23 MED ORDER — MAGNESIUM OXIDE -MG SUPPLEMENT 400 (240 MG) MG PO TABS: 400.0000 mg | ORAL_TABLET | Freq: Every day | ORAL | 0 refills | Status: AC

## 2024-04-23 NOTE — ED Provider Notes (Signed)
 FBC/OBS ASAP Discharge Summary  Date and Time: 04/23/2024 12:32 PM  Name: [name]  MRN:  [id number]   Discharge Diagnoses:  Final diagnoses:  Alcohol use disorder  Adjustment disorder with depressed mood  B12 deficiency   History of Present illness: [name], 59 y.o., male  presented to [facility] voluntarily as a walk in, accompanied by mobile crisis, with complaints of wanting to detox from alcohol and some ongoing depressive symptoms in the context of losing his job and having some medical issues. ETTER Mcardle, Alan BROCKS, NP, Date of Service: 04/18/2024 12:40 PM).    Course of Stay Patient was admitted to the [facility] on 1/22, but declined residential treatment after it was repeatedly offered to him. Pt was more receptive to outpatient services. Patient reports benefiting from this [facility] admission, reports that his mood is currently stable; reports an improved sleep, improved appetite, denies any cravings for alcohol or other substances of abuse. Verbalizes motivation to abstain from substances of abuse after discharge so as to sustain his sobriety by following through with the discharge recommendations.  At start of pt's [facility] stay, he was started on an Ativan  taper to help with physiological symptoms of alcohol withdrawal. Pt completed the taper successfully with no complications on 1/26. Patient was assessed yesterday after completion of the taper, and again today, and remains consistent with reporting that he is not experiencing any symptoms of withdrawals from alcohol. He denies any cravings for alcohol at this time, refuses offers for medications to help him should he experience cravings for alcohol at some point when he returns home. We talked about the depressive symptoms related to the losses that he has experienced over the course of his life, and medications were offered, which he declined. Pt was yet again, offered the opportunity to work with CSW,  and go to rehabilitation for substance abuse, but is not receptive at this time to going to rehab, and is adamant that outpatient will be the route for him to go. Since he has completed his taper and is not experiencing any physiological withdrawal symptoms, there is no longer a reason for a continuous stay or monitoring at the [facility]. He denies any history of Seizures or complicated withdrawals such as Dts related to alcohol.   Labs were reviewed with the patient, and abnormal results were discussed with the patient.  The patient is able to verbalize their individual safety plan to this provider as follows:  # It is recommended to the patient to continue taking their medications as prescribed, after discharge from the the [facility]    # It is recommended to the patient to follow up with your outpatient psychiatric provider and PCP.  # It was discussed with the patient, the impact of alcohol, drugs, tobacco have been there overall psychiatric and medical wellbeing, and total abstinence from substance use was recommended the patient.  # Prescriptions sent directly to preferred pharmacy at discharge. Patient agreeable to plan. Given opportunity to ask questions. Appears to feel comfortable with discharge.    # In the event of worsening symptoms, the patient is instructed to call the crisis hotline, [phone] and or go to the nearest ED for appropriate evaluation and treatment of symptoms. To follow-up with primary care provider for other medical issues, concerns and or health care needs.  # Patient was discharged home with a plan to follow up as noted below:    Current Medications:  Current Facility-Administered Medications  Medication Dose Route  Frequency Provider Last Rate Last Admin   alum & mag hydroxide-simeth (MAALOX/MYLANTA) 200-200-20 MG/5ML suspension 30 mL  30 mL Oral Q4H PRN Brent, Amanda C, NP       amLODipine  (NORVASC ) tablet 5 mg  5 mg Oral Daily Brent, Amanda C, NP   5 mg at 04/23/24 0920    haloperidol  (HALDOL ) tablet 5 mg  5 mg Oral TID PRN Brent, Amanda C, NP       And   diphenhydrAMINE  (BENADRYL ) capsule 50 mg  50 mg Oral TID PRN Brent, Amanda C, NP       haloperidol  lactate (HALDOL ) injection 5 mg  5 mg Intramuscular TID PRN Brent, Amanda C, NP       And   diphenhydrAMINE  (BENADRYL ) injection 50 mg  50 mg Intramuscular TID PRN Brent, Amanda C, NP       And   LORazepam  (ATIVAN ) injection 2 mg  2 mg Intramuscular TID PRN Brent, Amanda C, NP       haloperidol  lactate (HALDOL ) injection 10 mg  10 mg Intramuscular TID PRN Thresa Alan BROCKS, NP       And   diphenhydrAMINE  (BENADRYL ) injection 50 mg  50 mg Intramuscular TID PRN Brent, Amanda C, NP       And   LORazepam  (ATIVAN ) injection 2 mg  2 mg Intramuscular TID PRN Brent, Amanda C, NP       folic acid  (FOLVITE ) tablet 1 mg  1 mg Oral Daily Brent, Amanda C, NP   1 mg at 04/23/24 9080   gabapentin  (NEURONTIN ) capsule 200 mg  200 mg Oral TID Tex Drilling, NP   200 mg at 04/23/24 9080   hydrOXYzine  (ATARAX ) tablet 25 mg  25 mg Oral TID PRN Brent, Amanda C, NP   25 mg at 04/20/24 2119   ibuprofen  (ADVIL ) tablet 200 mg  200 mg Oral Q6H PRN Ramzan, Mariam, NP   200 mg at 04/21/24 2126   loratadine  (CLARITIN ) tablet 10 mg  10 mg Oral Daily Burris Matherne, NP   10 mg at 04/23/24 0919   magnesium  hydroxide (MILK OF MAGNESIA) suspension 30 mL  30 mL Oral Daily PRN Brent, Amanda C, NP   30 mL at 04/21/24 1645   magnesium  oxide (MAG-OX) tablet 400 mg  400 mg Oral Daily Ramzan, Mariam, NP   400 mg at 04/23/24 0919   multivitamin with minerals tablet 1 tablet  1 tablet Oral Daily Brent, Amanda C, NP   1 tablet at 04/23/24 0919   rosuvastatin  (CRESTOR ) tablet 10 mg  10 mg Oral Daily Brent, Amanda C, NP   10 mg at 04/23/24 9080   thiamine  (VITAMIN B1) tablet 100 mg  100 mg Oral Daily Brent, Amanda C, NP   100 mg at 04/23/24 0919   traZODone  (DESYREL ) tablet 50 mg  50 mg Oral QHS PRN Brent, Amanda C, NP   50 mg at 04/22/24 2158   Vitamin D   (Ergocalciferol ) (DRISDOL ) 1.25 MG (50000 UNIT) capsule 50,000 Units  50,000 Units Oral Q7 days Ramzan, Mariam, NP   50,000 Units at 04/19/24 1618   Current Outpatient Medications  Medication Sig Dispense Refill   folic acid  (FOLVITE ) 1 MG tablet Take 1 tablet (1 mg total) by mouth daily. 90 tablet 1   rosuvastatin  (CRESTOR ) 10 MG tablet Take 1 tablet (10 mg total) by mouth daily. 90 tablet 0   amLODipine  (NORVASC ) 5 MG tablet Take 1 tablet (5 mg total) by mouth daily. 30 tablet 0  gabapentin  (NEURONTIN ) 100 MG capsule Take 2 capsules (200 mg total) by mouth 3 (three) times daily. 180 capsule 0   [START ON 04/24/2024] magnesium  oxide (MAG-OX) 400 (240 Mg) MG tablet Take 1 tablet (400 mg total) by mouth daily. 30 tablet 0   [START ON 04/24/2024] Multiple Vitamin (MULTIVITAMIN WITH MINERALS) TABS tablet Take 1 tablet by mouth daily. 30 tablet 0   [START ON 04/24/2024] thiamine  (VITAMIN B-1) 100 MG tablet Take 1 tablet (100 mg total) by mouth daily. 30 tablet 0   [START ON 04/26/2024] Vitamin D , Ergocalciferol , (DRISDOL ) 1.25 MG (50000 UNIT) CAPS capsule Take 1 capsule (50,000 Units total) by mouth every 7 (seven) days. 4 capsule 0    PTA Medications:  PTA Medications  Medication Sig   rosuvastatin  (CRESTOR ) 10 MG tablet Take 1 tablet (10 mg total) by mouth daily.   folic acid  (FOLVITE ) 1 MG tablet Take 1 tablet (1 mg total) by mouth daily.   gabapentin  (NEURONTIN ) 100 MG capsule Take 2 capsules (200 mg total) by mouth 3 (three) times daily.   [START ON 04/24/2024] magnesium  oxide (MAG-OX) 400 (240 Mg) MG tablet Take 1 tablet (400 mg total) by mouth daily.   [START ON 04/24/2024] Multiple Vitamin (MULTIVITAMIN WITH MINERALS) TABS tablet Take 1 tablet by mouth daily.   [START ON 04/24/2024] thiamine  (VITAMIN B-1) 100 MG tablet Take 1 tablet (100 mg total) by mouth daily.   [START ON 04/26/2024] Vitamin D , Ergocalciferol , (DRISDOL ) 1.25 MG (50000 UNIT) CAPS capsule Take 1 capsule (50,000 Units total) by  mouth every 7 (seven) days.   amLODipine  (NORVASC ) 5 MG tablet Take 1 tablet (5 mg total) by mouth daily.   Facility Ordered Medications  Medication   amLODipine  (NORVASC ) tablet 5 mg   folic acid  (FOLVITE ) tablet 1 mg   rosuvastatin  (CRESTOR ) tablet 10 mg   alum & mag hydroxide-simeth (MAALOX/MYLANTA) 200-200-20 MG/5ML suspension 30 mL   magnesium  hydroxide (MILK OF MAGNESIA) suspension 30 mL   thiamine  (VITAMIN B1) tablet 100 mg   multivitamin with minerals tablet 1 tablet   [EXPIRED] LORazepam  (ATIVAN ) tablet 1 mg   [EXPIRED] loperamide  (IMODIUM ) capsule 2-4 mg   [EXPIRED] ondansetron  (ZOFRAN -ODT) disintegrating tablet 4 mg   haloperidol  (HALDOL ) tablet 5 mg   And   diphenhydrAMINE  (BENADRYL ) capsule 50 mg   haloperidol  lactate (HALDOL ) injection 5 mg   And   diphenhydrAMINE  (BENADRYL ) injection 50 mg   And   LORazepam  (ATIVAN ) injection 2 mg   haloperidol  lactate (HALDOL ) injection 10 mg   And   diphenhydrAMINE  (BENADRYL ) injection 50 mg   And   LORazepam  (ATIVAN ) injection 2 mg   traZODone  (DESYREL ) tablet 50 mg   hydrOXYzine  (ATARAX ) tablet 25 mg   [COMPLETED] LORazepam  (ATIVAN ) tablet 1 mg   Followed by   [COMPLETED] LORazepam  (ATIVAN ) tablet 1 mg   Followed by   [COMPLETED] LORazepam  (ATIVAN ) tablet 1 mg   Followed by   [COMPLETED] LORazepam  (ATIVAN ) tablet 1 mg   magnesium  oxide (MAG-OX) tablet 400 mg   ibuprofen  (ADVIL ) tablet 200 mg   Vitamin D  (Ergocalciferol ) (DRISDOL ) 1.25 MG (50000 UNIT) capsule 50,000 Units   [COMPLETED] cyanocobalamin  (VITAMIN B12) injection 1,000 mcg   loratadine  (CLARITIN ) tablet 10 mg   gabapentin  (NEURONTIN ) capsule 200 mg       04/23/2024   11:20 AM 04/22/2024   11:58 AM 04/18/2024    3:32 PM  Depression screen PHQ 2/9  Decreased Interest 1 1 1   Down, Depressed, Hopeless 1 1 1  PHQ - 2 Score 2 2 2   Altered sleeping 1 1 1   Tired, decreased energy 0 1 1  Change in appetite 0 1 0  Feeling bad or failure about yourself  0 0 1   Trouble concentrating 0 0 0  Moving slowly or fidgety/restless 0 0 0  Suicidal thoughts 0 0 0  PHQ-9 Score 3 5 5   Difficult doing work/chores Somewhat difficult Somewhat difficult Somewhat difficult    Flowsheet Row ED from 04/18/2024 in [facility] Most recent reading at 04/21/2024 12:00 AM ED from 04/18/2024 in [facility] Most recent reading at 04/18/2024 10:52 AM UC from 03/15/2024 in [facility]) Most recent reading at 03/15/2024  4:31 PM  C-SSRS RISK CATEGORY No Risk No Risk No Risk    Musculoskeletal  Strength & Muscle Tone: within normal limits Gait & Station: normal Patient leans: N/A  Psychiatric Specialty Exam  Presentation  General Appearance:  Appropriate for Environment; Casual  Eye Contact: Fair  Speech: Clear and Coherent  Speech Volume: Normal  Handedness: Right   Mood and Affect  Mood: Euthymic  Affect: Congruent   Thought Process  Thought Processes: Coherent  Descriptions of Associations:Intact  Orientation:Full (Time, Place and Person)  Thought Content:Logical  Diagnosis of Schizophrenia or Schizoaffective disorder in past: No    Hallucinations:Hallucinations: None  Ideas of Reference:None  Suicidal Thoughts:Suicidal Thoughts: No  Homicidal Thoughts:Homicidal Thoughts: No   Sensorium  Memory: Immediate Good  Judgment: Good  Insight: Good   Executive Functions  Concentration: Good  Attention Span: Good  Recall: Good  Fund of Knowledge: Good  Language: Good   Psychomotor Activity  Psychomotor Activity: Psychomotor Activity: Normal   Assets  Assets: Communication Skills; Resilience   Sleep  Sleep: Sleep: Good  Estimated Sleeping Duration (Last 24 Hours): 11.25-12.25 hours  Nutritional Assessment (For OBS and FBC admissions only) Has the patient had a weight loss or gain of 10 pounds or more in  the last 3 months?: No Has the patient had a decrease in food intake/or appetite?: No Does the patient have dental problems?: No Does the patient have eating habits or behaviors that may be indicators of an eating disorder including binging or inducing vomiting?: No Has the patient recently lost weight without trying?: 0 Has the patient been eating poorly because of a decreased appetite?: 0 Malnutrition Screening Tool Score: 0    Physical Exam  Physical Exam Vitals and nursing note reviewed.  Constitutional:      Appearance: Normal appearance.  Eyes:     Pupils: Pupils are equal, round, and reactive to light.  Musculoskeletal:        General: Normal range of motion.  Neurological:     General: No focal deficit present.     Mental Status: He is alert and oriented to person, place, and time.  Psychiatric:        Mood and Affect: Mood normal.        Behavior: Behavior normal.        Thought Content: Thought content normal.        Judgment: Judgment normal.    Review of Systems  Psychiatric/Behavioral:  Positive for depression (stable for outpatient management) and substance abuse (Educated on the need to abstain from substances of abuse). Negative for hallucinations, memory loss and suicidal ideas. The patient is not nervous/anxious and does not have insomnia.    Blood pressure 122/78, pulse 97, temperature 98.1 F (  36.7 C), temperature source Oral, resp. rate 16, SpO2 100%. There is no height or weight on file to calculate BMI.  Demographic Factors:  Male  Loss Factors: Financial problems/change in socioeconomic status  Historical Factors: Family history of mental illness or substance abuse  Risk Reduction Factors:   NA  Continued Clinical Symptoms:  Alcohol/Substance Abuse/Dependencies  Cognitive Features That Contribute To Risk:  None    Suicide Risk:  Mild:Denies  Suicidal ideation, denies intent or plan to harm himself or any one else. Denies psychosis of any  type.  There are no identifiable plans, no associated intent, mild dysphoria and related symptoms, good self-control (both objective and subjective assessment), few other risk factors, and identifiable protective factors, including available and accessible social support.  Plan Of Care/Follow-up recommendations:  Follow up with  [facility]OP as discussed.  Disposition: Provided with transportation   Donia Snell, NP 04/23/2024, 12:32 PM

## 2024-04-23 NOTE — ED Notes (Signed)
 Pt is compliant with medications.  Pt is pleasant,  Mainatained on CIWA.  CIWA score is 0.  Observed to turn and reposition in bed during safety rounds.Safety maintained.

## 2024-04-23 NOTE — ED Notes (Signed)
 Pt reports to feel 'alright'. Pt denies si hi and avh- verbal contract for safety provided. Pt ate breakfast, denies physical pain and discomfort. Medications reviewed, questions denied.

## 2024-04-23 NOTE — ED Notes (Signed)
 Patient denies pain and is resting comfortably.

## 2024-04-23 NOTE — ED Notes (Signed)
 Discharge paperwork discussed with pt including AVS, medications, and follow up care, pt expressed understanding. Pt escorted off unit by staff to taxi, items returned.

## 2024-04-23 NOTE — Group Note (Signed)
 Group Topic: Healthy Self Image and Positive Change  Group Date: 04/22/2024 Start Time: 2030 End Time: 2100 Facilitators: Lauree Pickle, NT  Department: [facility]  Number of Participants: 8  Group Focus: self-awareness Treatment Modality:  Individual Therapy Interventions utilized were mental fitness Purpose: express feelings  Name: [name] Date of Birth: November 12, 1965  MR: [id number]    Level of Participation: minimal Quality of Participation: cooperative Interactions with others: gave feedback Mood/Affect: appropriate Triggers (if applicable): N/A Cognition: coherent/clear Progress: Moderate Response: Good Plan: follow-up needed  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   Hearing loss secondary to cerumen impaction, left 11/02/2023   Deficiency anemia 11/02/2023   Alcohol use disorder, moderate, dependence (HCC) 11/02/2023   PSA elevation 11/02/2023   Alcoholic hepatitis without ascites (HCC) 11/02/2023   Dietary folate deficiency anemia 11/02/2023   Vitamin B12 deficiency (dietary) anemia 11/02/2023   Non-recurrent bilateral inguinal hernia without obstruction or gangrene 07/07/2022   Encounter for general adult medical examination with abnormal findings 12/28/2020   Loud snoring 12/28/2020   Idiopathic chronic gout of multiple sites without tophus 12/28/2020   Elevated ferritin 02/17/2020   Essential hypertension 10/02/2019   Dyslipidemia, goal LDL below 100 10/02/2019   Hypersomnia with sleep apnea 06/09/2016   Excessive daytime sleepiness 06/09/2016   Parasomnia, 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

## 2024-04-24 LAB — VITAMIN B1: Vitamin B1 (Thiamine): 118.2 nmol/L (ref 66.5–200.0)

## 2024-04-26 ENCOUNTER — Telehealth (HOSPITAL_COMMUNITY): Payer: Self-pay | Admitting: Licensed Clinical Social Worker

## 2024-04-26 ENCOUNTER — Ambulatory Visit (HOSPITAL_COMMUNITY)

## 2024-04-26 NOTE — Telephone Encounter (Signed)
 The therapist attempts to reach [name] in relation to his no showing for group and leaves a HIPAA-compliant voicemail with his direct callback number.  Zell Maier, MA, LCSW, [facility], LCAS 04/26/2024
